# Patient Record
Sex: Male | Born: 1970
Health system: Southern US, Community
[De-identification: ages and names within clinical notes are randomized; demographics above are authoritative.]

## PROBLEM LIST (undated history)

## (undated) DIAGNOSIS — F32A Depression, unspecified: Secondary | ICD-10-CM

## (undated) DIAGNOSIS — R569 Unspecified convulsions: Secondary | ICD-10-CM

## (undated) DIAGNOSIS — F259 Schizoaffective disorder, unspecified: Secondary | ICD-10-CM

## (undated) DIAGNOSIS — I712 Thoracic aortic aneurysm, without rupture: Secondary | ICD-10-CM

## (undated) DIAGNOSIS — R300 Dysuria: Secondary | ICD-10-CM

## (undated) DIAGNOSIS — I10 Essential (primary) hypertension: Secondary | ICD-10-CM

## (undated) DIAGNOSIS — R0602 Shortness of breath: Secondary | ICD-10-CM

## (undated) DIAGNOSIS — M549 Dorsalgia, unspecified: Secondary | ICD-10-CM

## (undated) DIAGNOSIS — F250 Schizoaffective disorder, bipolar type: Secondary | ICD-10-CM

## (undated) DIAGNOSIS — R51 Headache: Secondary | ICD-10-CM

## (undated) DIAGNOSIS — G8929 Other chronic pain: Secondary | ICD-10-CM

## (undated) DIAGNOSIS — Z8719 Personal history of other diseases of the digestive system: Secondary | ICD-10-CM

## (undated) DIAGNOSIS — F329 Major depressive disorder, single episode, unspecified: Secondary | ICD-10-CM

## (undated) DIAGNOSIS — F99 Mental disorder, not otherwise specified: Secondary | ICD-10-CM

## (undated) DIAGNOSIS — Z5900 Homelessness unspecified: Secondary | ICD-10-CM

## (undated) DIAGNOSIS — F319 Bipolar disorder, unspecified: Secondary | ICD-10-CM

## (undated) DIAGNOSIS — J189 Pneumonia, unspecified organism: Secondary | ICD-10-CM

## (undated) DIAGNOSIS — I639 Cerebral infarction, unspecified: Secondary | ICD-10-CM

## (undated) DIAGNOSIS — J42 Unspecified chronic bronchitis: Secondary | ICD-10-CM

## (undated) DIAGNOSIS — F419 Anxiety disorder, unspecified: Secondary | ICD-10-CM

## (undated) DIAGNOSIS — K219 Gastro-esophageal reflux disease without esophagitis: Secondary | ICD-10-CM

## (undated) DIAGNOSIS — I219 Acute myocardial infarction, unspecified: Secondary | ICD-10-CM

## (undated) DIAGNOSIS — M1990 Unspecified osteoarthritis, unspecified site: Secondary | ICD-10-CM

## (undated) DIAGNOSIS — K759 Inflammatory liver disease, unspecified: Secondary | ICD-10-CM

---

## 2004-08-26 ENCOUNTER — Emergency Department (HOSPITAL_COMMUNITY): Admission: EM | Admit: 2004-08-26 | Discharge: 2004-08-26 | Payer: Self-pay | Admitting: Emergency Medicine

## 2004-09-07 ENCOUNTER — Emergency Department (HOSPITAL_COMMUNITY): Admission: EM | Admit: 2004-09-07 | Discharge: 2004-09-07 | Payer: Self-pay | Admitting: Emergency Medicine

## 2004-09-14 ENCOUNTER — Emergency Department (HOSPITAL_COMMUNITY): Admission: EM | Admit: 2004-09-14 | Discharge: 2004-09-14 | Payer: Self-pay | Admitting: Emergency Medicine

## 2004-10-05 ENCOUNTER — Emergency Department (HOSPITAL_COMMUNITY): Admission: EM | Admit: 2004-10-05 | Discharge: 2004-10-05 | Payer: Self-pay | Admitting: *Deleted

## 2004-11-22 ENCOUNTER — Emergency Department (HOSPITAL_COMMUNITY): Admission: EM | Admit: 2004-11-22 | Discharge: 2004-11-22 | Payer: Self-pay | Admitting: Emergency Medicine

## 2004-11-24 ENCOUNTER — Emergency Department (HOSPITAL_COMMUNITY): Admission: EM | Admit: 2004-11-24 | Discharge: 2004-11-24 | Payer: Self-pay | Admitting: Emergency Medicine

## 2004-12-01 ENCOUNTER — Emergency Department (HOSPITAL_COMMUNITY): Admission: EM | Admit: 2004-12-01 | Discharge: 2004-12-01 | Payer: Self-pay | Admitting: Emergency Medicine

## 2004-12-02 ENCOUNTER — Emergency Department (HOSPITAL_COMMUNITY): Admission: EM | Admit: 2004-12-02 | Discharge: 2004-12-02 | Payer: Self-pay | Admitting: Emergency Medicine

## 2004-12-05 ENCOUNTER — Emergency Department (HOSPITAL_COMMUNITY): Admission: EM | Admit: 2004-12-05 | Discharge: 2004-12-05 | Payer: Self-pay | Admitting: Emergency Medicine

## 2004-12-27 ENCOUNTER — Emergency Department (HOSPITAL_COMMUNITY): Admission: EM | Admit: 2004-12-27 | Discharge: 2004-12-27 | Payer: Self-pay | Admitting: Emergency Medicine

## 2005-01-03 ENCOUNTER — Emergency Department (HOSPITAL_COMMUNITY): Admission: EM | Admit: 2005-01-03 | Discharge: 2005-01-03 | Payer: Self-pay | Admitting: Emergency Medicine

## 2005-01-05 ENCOUNTER — Emergency Department (HOSPITAL_COMMUNITY): Admission: EM | Admit: 2005-01-05 | Discharge: 2005-01-05 | Payer: Self-pay | Admitting: Emergency Medicine

## 2005-10-17 ENCOUNTER — Emergency Department (HOSPITAL_COMMUNITY): Admission: EM | Admit: 2005-10-17 | Discharge: 2005-10-17 | Payer: Self-pay | Admitting: *Deleted

## 2005-11-13 ENCOUNTER — Emergency Department (HOSPITAL_COMMUNITY): Admission: EM | Admit: 2005-11-13 | Discharge: 2005-11-13 | Payer: Self-pay | Admitting: Emergency Medicine

## 2005-11-14 ENCOUNTER — Emergency Department (HOSPITAL_COMMUNITY): Admission: EM | Admit: 2005-11-14 | Discharge: 2005-11-14 | Payer: Self-pay | Admitting: Emergency Medicine

## 2005-11-15 ENCOUNTER — Emergency Department (HOSPITAL_COMMUNITY): Admission: EM | Admit: 2005-11-15 | Discharge: 2005-11-15 | Payer: Self-pay | Admitting: *Deleted

## 2005-11-21 ENCOUNTER — Emergency Department (HOSPITAL_COMMUNITY): Admission: EM | Admit: 2005-11-21 | Discharge: 2005-11-21 | Payer: Self-pay | Admitting: Emergency Medicine

## 2006-01-16 ENCOUNTER — Encounter: Payer: Self-pay | Admitting: Vascular Surgery

## 2006-01-16 ENCOUNTER — Ambulatory Visit (HOSPITAL_COMMUNITY): Admission: RE | Admit: 2006-01-16 | Discharge: 2006-01-16 | Payer: Self-pay | Admitting: Internal Medicine

## 2006-01-18 ENCOUNTER — Emergency Department (HOSPITAL_COMMUNITY): Admission: EM | Admit: 2006-01-18 | Discharge: 2006-01-18 | Payer: Self-pay | Admitting: Emergency Medicine

## 2006-03-20 HISTORY — PX: CHOLECYSTECTOMY: SHX55

## 2006-05-06 ENCOUNTER — Emergency Department (HOSPITAL_COMMUNITY): Admission: EM | Admit: 2006-05-06 | Discharge: 2006-05-06 | Payer: Self-pay | Admitting: Emergency Medicine

## 2007-01-18 ENCOUNTER — Observation Stay (HOSPITAL_COMMUNITY): Admission: EM | Admit: 2007-01-18 | Discharge: 2007-01-19 | Payer: Self-pay | Admitting: Emergency Medicine

## 2007-01-18 ENCOUNTER — Encounter (INDEPENDENT_AMBULATORY_CARE_PROVIDER_SITE_OTHER): Payer: Self-pay | Admitting: Surgery

## 2007-01-26 ENCOUNTER — Emergency Department (HOSPITAL_COMMUNITY): Admission: EM | Admit: 2007-01-26 | Discharge: 2007-01-26 | Payer: Self-pay | Admitting: Emergency Medicine

## 2007-02-05 ENCOUNTER — Emergency Department (HOSPITAL_COMMUNITY): Admission: EM | Admit: 2007-02-05 | Discharge: 2007-02-05 | Payer: Self-pay | Admitting: Emergency Medicine

## 2007-02-07 ENCOUNTER — Emergency Department (HOSPITAL_COMMUNITY): Admission: EM | Admit: 2007-02-07 | Discharge: 2007-02-07 | Payer: Self-pay | Admitting: Emergency Medicine

## 2007-05-22 ENCOUNTER — Emergency Department (HOSPITAL_COMMUNITY): Admission: EM | Admit: 2007-05-22 | Discharge: 2007-05-22 | Payer: Self-pay | Admitting: Emergency Medicine

## 2007-05-24 ENCOUNTER — Emergency Department (HOSPITAL_COMMUNITY): Admission: EM | Admit: 2007-05-24 | Discharge: 2007-05-24 | Payer: Self-pay | Admitting: Emergency Medicine

## 2007-05-28 ENCOUNTER — Emergency Department (HOSPITAL_COMMUNITY): Admission: EM | Admit: 2007-05-28 | Discharge: 2007-05-28 | Payer: Self-pay | Admitting: Emergency Medicine

## 2007-06-04 ENCOUNTER — Encounter: Admission: RE | Admit: 2007-06-04 | Discharge: 2007-06-04 | Payer: Self-pay | Admitting: Gastroenterology

## 2007-06-19 ENCOUNTER — Emergency Department (HOSPITAL_COMMUNITY): Admission: EM | Admit: 2007-06-19 | Discharge: 2007-06-19 | Payer: Self-pay | Admitting: Emergency Medicine

## 2007-06-25 ENCOUNTER — Emergency Department (HOSPITAL_COMMUNITY): Admission: EM | Admit: 2007-06-25 | Discharge: 2007-06-25 | Payer: Self-pay | Admitting: Emergency Medicine

## 2007-06-27 ENCOUNTER — Emergency Department (HOSPITAL_COMMUNITY): Admission: EM | Admit: 2007-06-27 | Discharge: 2007-06-27 | Payer: Self-pay | Admitting: Emergency Medicine

## 2007-06-29 ENCOUNTER — Emergency Department (HOSPITAL_COMMUNITY): Admission: EM | Admit: 2007-06-29 | Discharge: 2007-06-29 | Payer: Self-pay | Admitting: Emergency Medicine

## 2007-06-30 ENCOUNTER — Emergency Department (HOSPITAL_COMMUNITY): Admission: EM | Admit: 2007-06-30 | Discharge: 2007-06-30 | Payer: Self-pay | Admitting: Emergency Medicine

## 2007-07-04 ENCOUNTER — Emergency Department (HOSPITAL_COMMUNITY): Admission: EM | Admit: 2007-07-04 | Discharge: 2007-07-04 | Payer: Self-pay | Admitting: Emergency Medicine

## 2007-07-10 ENCOUNTER — Emergency Department (HOSPITAL_COMMUNITY): Admission: EM | Admit: 2007-07-10 | Discharge: 2007-07-10 | Payer: Self-pay | Admitting: Emergency Medicine

## 2007-07-15 ENCOUNTER — Emergency Department (HOSPITAL_COMMUNITY): Admission: EM | Admit: 2007-07-15 | Discharge: 2007-07-15 | Payer: Self-pay | Admitting: Emergency Medicine

## 2007-07-25 ENCOUNTER — Emergency Department (HOSPITAL_COMMUNITY): Admission: EM | Admit: 2007-07-25 | Discharge: 2007-07-25 | Payer: Self-pay | Admitting: Emergency Medicine

## 2007-08-14 ENCOUNTER — Emergency Department (HOSPITAL_COMMUNITY): Admission: EM | Admit: 2007-08-14 | Discharge: 2007-08-14 | Payer: Self-pay | Admitting: Emergency Medicine

## 2007-08-31 ENCOUNTER — Emergency Department (HOSPITAL_COMMUNITY): Admission: EM | Admit: 2007-08-31 | Discharge: 2007-08-31 | Payer: Self-pay | Admitting: Emergency Medicine

## 2007-11-26 ENCOUNTER — Encounter: Admission: RE | Admit: 2007-11-26 | Discharge: 2007-11-26 | Payer: Self-pay | Admitting: Surgery

## 2007-12-10 ENCOUNTER — Emergency Department (HOSPITAL_COMMUNITY): Admission: EM | Admit: 2007-12-10 | Discharge: 2007-12-10 | Payer: Self-pay | Admitting: Family Medicine

## 2007-12-23 ENCOUNTER — Emergency Department (HOSPITAL_COMMUNITY): Admission: EM | Admit: 2007-12-23 | Discharge: 2007-12-23 | Payer: Self-pay | Admitting: Emergency Medicine

## 2007-12-24 ENCOUNTER — Ambulatory Visit: Payer: Self-pay | Admitting: Internal Medicine

## 2007-12-24 LAB — CONVERTED CEMR LAB
ALT: 38 units/L (ref 0–53)
Albumin: 4.7 g/dL (ref 3.5–5.2)
Basophils Relative: 0 % (ref 0–1)
CO2: 26 meq/L (ref 19–32)
Calcium: 9.5 mg/dL (ref 8.4–10.5)
Chloride: 102 meq/L (ref 96–112)
Eosinophils Absolute: 0.2 10*3/uL (ref 0.0–0.7)
Glucose, Bld: 107 mg/dL — ABNORMAL HIGH (ref 70–99)
Hemoglobin: 17.4 g/dL — ABNORMAL HIGH (ref 13.0–17.0)
Lymphs Abs: 3.1 10*3/uL (ref 0.7–4.0)
MCV: 83.2 fL (ref 78.0–100.0)
Potassium: 4.2 meq/L (ref 3.5–5.3)
Sodium: 142 meq/L (ref 135–145)
Total Protein: 6.9 g/dL (ref 6.0–8.3)
Uric Acid, Serum: 9.7 mg/dL — ABNORMAL HIGH (ref 4.0–7.8)

## 2007-12-31 ENCOUNTER — Emergency Department (HOSPITAL_COMMUNITY): Admission: EM | Admit: 2007-12-31 | Discharge: 2007-12-31 | Payer: Self-pay | Admitting: Emergency Medicine

## 2008-01-03 ENCOUNTER — Emergency Department (HOSPITAL_COMMUNITY): Admission: EM | Admit: 2008-01-03 | Discharge: 2008-01-03 | Payer: Self-pay | Admitting: Emergency Medicine

## 2008-01-14 ENCOUNTER — Emergency Department (HOSPITAL_COMMUNITY): Admission: EM | Admit: 2008-01-14 | Discharge: 2008-01-14 | Payer: Self-pay | Admitting: Emergency Medicine

## 2008-01-22 ENCOUNTER — Ambulatory Visit: Payer: Self-pay | Admitting: Internal Medicine

## 2008-01-23 ENCOUNTER — Ambulatory Visit: Payer: Self-pay | Admitting: Internal Medicine

## 2008-01-24 ENCOUNTER — Ambulatory Visit: Payer: Self-pay | Admitting: *Deleted

## 2008-01-25 ENCOUNTER — Ambulatory Visit (HOSPITAL_COMMUNITY): Admission: RE | Admit: 2008-01-25 | Discharge: 2008-01-25 | Payer: Self-pay | Admitting: Internal Medicine

## 2008-01-31 ENCOUNTER — Emergency Department (HOSPITAL_COMMUNITY): Admission: EM | Admit: 2008-01-31 | Discharge: 2008-01-31 | Payer: Self-pay | Admitting: Emergency Medicine

## 2008-02-12 ENCOUNTER — Ambulatory Visit: Payer: Self-pay | Admitting: Internal Medicine

## 2008-03-18 ENCOUNTER — Ambulatory Visit: Payer: Self-pay | Admitting: Internal Medicine

## 2008-04-17 ENCOUNTER — Ambulatory Visit: Payer: Self-pay | Admitting: Family Medicine

## 2008-04-23 ENCOUNTER — Ambulatory Visit: Payer: Self-pay | Admitting: Internal Medicine

## 2008-04-23 LAB — CONVERTED CEMR LAB
ALT: 24 units/L (ref 0–53)
BUN: 9 mg/dL (ref 6–23)
CO2: 22 meq/L (ref 19–32)
Chloride: 107 meq/L (ref 96–112)
HCV Ab: NEGATIVE
Hep A Total Ab: POSITIVE — AB
Hepatitis B Surface Ag: NEGATIVE
Rhuematoid fact SerPl-aCnc: <20 intl units/mL (ref 0–20)
Total Bilirubin: 1.1 mg/dL (ref 0.3–1.2)
Total Protein: 7.3 g/dL (ref 6.0–8.3)

## 2008-04-24 ENCOUNTER — Encounter (INDEPENDENT_AMBULATORY_CARE_PROVIDER_SITE_OTHER): Payer: Self-pay | Admitting: Internal Medicine

## 2008-04-26 ENCOUNTER — Emergency Department (HOSPITAL_COMMUNITY): Admission: EM | Admit: 2008-04-26 | Discharge: 2008-04-27 | Payer: Self-pay | Admitting: Emergency Medicine

## 2008-05-13 ENCOUNTER — Ambulatory Visit (HOSPITAL_COMMUNITY): Admission: RE | Admit: 2008-05-13 | Discharge: 2008-05-13 | Payer: Self-pay | Admitting: Internal Medicine

## 2008-05-21 ENCOUNTER — Ambulatory Visit: Payer: Self-pay | Admitting: Internal Medicine

## 2008-05-31 ENCOUNTER — Emergency Department (HOSPITAL_COMMUNITY): Admission: EM | Admit: 2008-05-31 | Discharge: 2008-05-31 | Payer: Self-pay | Admitting: Emergency Medicine

## 2008-06-12 ENCOUNTER — Encounter: Admission: RE | Admit: 2008-06-12 | Discharge: 2008-06-12 | Payer: Self-pay | Admitting: Internal Medicine

## 2008-06-13 ENCOUNTER — Emergency Department (HOSPITAL_COMMUNITY): Admission: EM | Admit: 2008-06-13 | Discharge: 2008-06-13 | Payer: Self-pay | Admitting: Emergency Medicine

## 2008-06-24 ENCOUNTER — Ambulatory Visit: Payer: Self-pay | Admitting: Internal Medicine

## 2008-06-30 ENCOUNTER — Ambulatory Visit: Payer: Self-pay | Admitting: Internal Medicine

## 2008-07-01 ENCOUNTER — Encounter (INDEPENDENT_AMBULATORY_CARE_PROVIDER_SITE_OTHER): Payer: Self-pay | Admitting: Internal Medicine

## 2008-07-07 ENCOUNTER — Emergency Department (HOSPITAL_COMMUNITY): Admission: EM | Admit: 2008-07-07 | Discharge: 2008-07-08 | Payer: Self-pay | Admitting: Emergency Medicine

## 2008-07-14 ENCOUNTER — Emergency Department (HOSPITAL_COMMUNITY): Admission: EM | Admit: 2008-07-14 | Discharge: 2008-07-14 | Payer: Self-pay | Admitting: Emergency Medicine

## 2008-07-24 ENCOUNTER — Emergency Department (HOSPITAL_COMMUNITY): Admission: EM | Admit: 2008-07-24 | Discharge: 2008-07-24 | Payer: Self-pay | Admitting: Emergency Medicine

## 2008-08-06 ENCOUNTER — Ambulatory Visit: Payer: Self-pay | Admitting: Internal Medicine

## 2008-08-26 ENCOUNTER — Ambulatory Visit: Payer: Self-pay | Admitting: Internal Medicine

## 2008-08-31 ENCOUNTER — Ambulatory Visit: Payer: Self-pay | Admitting: Internal Medicine

## 2008-11-13 ENCOUNTER — Emergency Department (HOSPITAL_COMMUNITY): Admission: EM | Admit: 2008-11-13 | Discharge: 2008-11-13 | Payer: Self-pay | Admitting: Emergency Medicine

## 2008-11-13 ENCOUNTER — Telehealth (INDEPENDENT_AMBULATORY_CARE_PROVIDER_SITE_OTHER): Payer: Self-pay | Admitting: *Deleted

## 2008-11-17 ENCOUNTER — Emergency Department (HOSPITAL_COMMUNITY): Admission: EM | Admit: 2008-11-17 | Discharge: 2008-11-17 | Payer: Self-pay | Admitting: Emergency Medicine

## 2008-11-19 ENCOUNTER — Emergency Department (HOSPITAL_COMMUNITY): Admission: EM | Admit: 2008-11-19 | Discharge: 2008-11-19 | Payer: Self-pay | Admitting: Emergency Medicine

## 2008-11-30 ENCOUNTER — Telehealth (INDEPENDENT_AMBULATORY_CARE_PROVIDER_SITE_OTHER): Payer: Self-pay | Admitting: *Deleted

## 2008-12-09 ENCOUNTER — Ambulatory Visit: Payer: Self-pay | Admitting: Internal Medicine

## 2009-02-12 ENCOUNTER — Inpatient Hospital Stay (HOSPITAL_COMMUNITY): Admission: EM | Admit: 2009-02-12 | Discharge: 2009-02-15 | Payer: Self-pay | Admitting: Emergency Medicine

## 2009-04-13 ENCOUNTER — Ambulatory Visit: Payer: Self-pay | Admitting: Family Medicine

## 2009-05-07 ENCOUNTER — Emergency Department (HOSPITAL_COMMUNITY): Admission: EM | Admit: 2009-05-07 | Discharge: 2009-05-08 | Payer: Self-pay | Admitting: Emergency Medicine

## 2009-07-06 ENCOUNTER — Ambulatory Visit: Payer: Self-pay | Admitting: Internal Medicine

## 2009-08-04 ENCOUNTER — Ambulatory Visit: Payer: Self-pay | Admitting: Internal Medicine

## 2009-09-01 ENCOUNTER — Ambulatory Visit: Payer: Self-pay | Admitting: Internal Medicine

## 2009-11-06 ENCOUNTER — Observation Stay (HOSPITAL_COMMUNITY): Admission: EM | Admit: 2009-11-06 | Discharge: 2009-11-09 | Payer: Self-pay | Admitting: Emergency Medicine

## 2010-01-13 ENCOUNTER — Emergency Department (HOSPITAL_COMMUNITY): Admission: EM | Admit: 2010-01-13 | Discharge: 2010-01-14 | Payer: Self-pay | Admitting: Emergency Medicine

## 2010-01-28 ENCOUNTER — Ambulatory Visit: Payer: Self-pay | Admitting: Psychiatry

## 2010-02-24 ENCOUNTER — Emergency Department (HOSPITAL_COMMUNITY)
Admission: EM | Admit: 2010-02-24 | Discharge: 2010-02-24 | Payer: Self-pay | Source: Home / Self Care | Admitting: Emergency Medicine

## 2010-03-20 DIAGNOSIS — I712 Thoracic aortic aneurysm, without rupture, unspecified: Secondary | ICD-10-CM

## 2010-03-20 HISTORY — DX: Thoracic aortic aneurysm, without rupture: I71.2

## 2010-03-20 HISTORY — DX: Thoracic aortic aneurysm, without rupture, unspecified: I71.20

## 2010-04-20 ENCOUNTER — Encounter (INDEPENDENT_AMBULATORY_CARE_PROVIDER_SITE_OTHER): Payer: Self-pay | Admitting: *Deleted

## 2010-04-20 LAB — CONVERTED CEMR LAB
BUN: 12 mg/dL (ref 6–23)
Basophils Relative: 0 % (ref 0–1)
CO2: 23 meq/L (ref 19–32)
Calcium: 9.6 mg/dL (ref 8.4–10.5)
Chloride: 97 meq/L (ref 96–112)
Creatinine, Ser: 0.89 mg/dL (ref 0.40–1.50)
HCT: 51.4 % (ref 39.0–52.0)
Hemoglobin: 18 g/dL — ABNORMAL HIGH (ref 13.0–17.0)
MCHC: 35 g/dL (ref 30.0–36.0)
Monocytes Absolute: 0.9 10*3/uL (ref 0.1–1.0)
Monocytes Relative: 8 % (ref 3–12)
Neutro Abs: 6 10*3/uL (ref 1.7–7.7)
RBC: 6.19 M/uL — ABNORMAL HIGH (ref 4.22–5.81)
Total CK: 158 units/L (ref 7–232)

## 2010-04-21 ENCOUNTER — Encounter (INDEPENDENT_AMBULATORY_CARE_PROVIDER_SITE_OTHER): Payer: Self-pay | Admitting: *Deleted

## 2010-04-21 LAB — CONVERTED CEMR LAB
Hep B Core Total Ab: POSITIVE — AB
Hep B S Ab: POSITIVE — AB

## 2010-04-28 ENCOUNTER — Emergency Department (HOSPITAL_COMMUNITY): Payer: Medicaid Other

## 2010-04-28 ENCOUNTER — Inpatient Hospital Stay (HOSPITAL_COMMUNITY): Payer: Medicaid Other

## 2010-04-28 ENCOUNTER — Inpatient Hospital Stay (HOSPITAL_COMMUNITY)
Admission: EM | Admit: 2010-04-28 | Discharge: 2010-05-04 | DRG: 917 | Disposition: A | Discharge status: Other Acute Inpatient Hospital | Payer: Medicaid Other | Attending: Internal Medicine | Admitting: Internal Medicine

## 2010-04-28 DIAGNOSIS — J690 Pneumonitis due to inhalation of food and vomit: Secondary | ICD-10-CM | POA: Diagnosis present

## 2010-04-28 DIAGNOSIS — N179 Acute kidney failure, unspecified: Secondary | ICD-10-CM | POA: Diagnosis present

## 2010-04-28 DIAGNOSIS — F329 Major depressive disorder, single episode, unspecified: Secondary | ICD-10-CM | POA: Diagnosis present

## 2010-04-28 DIAGNOSIS — T424X4A Poisoning by benzodiazepines, undetermined, initial encounter: Principal | ICD-10-CM | POA: Diagnosis present

## 2010-04-28 DIAGNOSIS — G8929 Other chronic pain: Secondary | ICD-10-CM | POA: Diagnosis present

## 2010-04-28 DIAGNOSIS — R4182 Altered mental status, unspecified: Secondary | ICD-10-CM

## 2010-04-28 DIAGNOSIS — F3289 Other specified depressive episodes: Secondary | ICD-10-CM | POA: Diagnosis present

## 2010-04-28 DIAGNOSIS — E119 Type 2 diabetes mellitus without complications: Secondary | ICD-10-CM | POA: Diagnosis present

## 2010-04-28 DIAGNOSIS — T43591A Poisoning by other antipsychotics and neuroleptics, accidental (unintentional), initial encounter: Secondary | ICD-10-CM | POA: Diagnosis present

## 2010-04-28 DIAGNOSIS — J9600 Acute respiratory failure, unspecified whether with hypoxia or hypercapnia: Secondary | ICD-10-CM

## 2010-04-28 DIAGNOSIS — I959 Hypotension, unspecified: Secondary | ICD-10-CM

## 2010-04-28 DIAGNOSIS — Z794 Long term (current) use of insulin: Secondary | ICD-10-CM

## 2010-04-28 LAB — POCT I-STAT, CHEM 8
Chloride: 96 mEq/L (ref 96–112)
Creatinine, Ser: 11.2 mg/dL — ABNORMAL HIGH (ref 0.4–1.5)
Glucose, Bld: 128 mg/dL — ABNORMAL HIGH (ref 70–99)
Potassium: 4 mEq/L (ref 3.5–5.1)

## 2010-04-28 LAB — CK TOTAL AND CKMB (NOT AT ARMC): Total CK: 7304 U/L — ABNORMAL HIGH (ref 7–232)

## 2010-04-28 LAB — POCT I-STAT 3, ART BLOOD GAS (G3+)
Acid-base deficit: 8 mmol/L — ABNORMAL HIGH (ref 0.0–2.0)
Acid-base deficit: 8 mmol/L — ABNORMAL HIGH (ref 0.0–2.0)
O2 Saturation: 95 %
O2 Saturation: 95 %
Patient temperature: 37.1
TCO2: 19 mmol/L (ref 0–100)
pCO2 arterial: 33.6 mmHg — ABNORMAL LOW (ref 35.0–45.0)
pCO2 arterial: 35.3 mmHg (ref 35.0–45.0)
pH, Arterial: 7.306 — ABNORMAL LOW (ref 7.350–7.450)
pO2, Arterial: 79 mmHg — ABNORMAL LOW (ref 80.0–100.0)

## 2010-04-28 LAB — URINALYSIS, ROUTINE W REFLEX MICROSCOPIC
Protein, ur: NEGATIVE mg/dL
Urobilinogen, UA: 0.2 mg/dL (ref 0.0–1.0)

## 2010-04-28 LAB — HEPATIC FUNCTION PANEL
AST: 74 U/L — ABNORMAL HIGH (ref 0–37)
Alkaline Phosphatase: 71 U/L (ref 39–117)
Bilirubin, Direct: 0.3 mg/dL (ref 0.0–0.3)
Total Bilirubin: 1.6 mg/dL — ABNORMAL HIGH (ref 0.3–1.2)

## 2010-04-28 LAB — CBC
HCT: 47.9 % (ref 39.0–52.0)
MCHC: 36.3 g/dL — ABNORMAL HIGH (ref 30.0–36.0)
MCV: 80.6 fL (ref 78.0–100.0)
RDW: 13 % (ref 11.5–15.5)
WBC: 17.9 10*3/uL — ABNORMAL HIGH (ref 4.0–10.5)

## 2010-04-28 LAB — DIFFERENTIAL
Basophils Relative: 0 % (ref 0–1)
Eosinophils Relative: 0 % (ref 0–5)
Lymphs Abs: 2.1 10*3/uL (ref 0.7–4.0)
Monocytes Absolute: 2.3 10*3/uL — ABNORMAL HIGH (ref 0.1–1.0)

## 2010-04-28 LAB — BASIC METABOLIC PANEL
CO2: 19 mEq/L (ref 19–32)
Calcium: 6.6 mg/dL — ABNORMAL LOW (ref 8.4–10.5)
Chloride: 98 mEq/L (ref 96–112)
Creatinine, Ser: 9.14 mg/dL — ABNORMAL HIGH (ref 0.4–1.5)
Glucose, Bld: 141 mg/dL — ABNORMAL HIGH (ref 70–99)

## 2010-04-28 LAB — RAPID URINE DRUG SCREEN, HOSP PERFORMED
Amphetamines: NOT DETECTED
Barbiturates: NOT DETECTED
Benzodiazepines: POSITIVE — AB
Cocaine: NOT DETECTED
Opiates: NOT DETECTED

## 2010-04-28 LAB — CARDIAC PANEL(CRET KIN+CKTOT+MB+TROPI)
Total CK: 8624 U/L — ABNORMAL HIGH (ref 7–232)
Troponin I: 0.35 ng/mL — ABNORMAL HIGH (ref 0.00–0.06)

## 2010-04-28 LAB — GLUCOSE, CAPILLARY
Glucose-Capillary: 115 mg/dL — ABNORMAL HIGH (ref 70–99)
Glucose-Capillary: 116 mg/dL — ABNORMAL HIGH (ref 70–99)

## 2010-04-28 LAB — MAGNESIUM: Magnesium: 1.9 mg/dL (ref 1.5–2.5)

## 2010-04-28 LAB — BRAIN NATRIURETIC PEPTIDE: Pro B Natriuretic peptide (BNP): <30 pg/mL (ref 0.0–100.0)

## 2010-04-28 LAB — APTT: aPTT: 28 seconds (ref 24–37)

## 2010-04-28 LAB — SAMPLE TO BLOOD BANK

## 2010-04-28 LAB — PROCALCITONIN: Procalcitonin: 10.08 ng/mL

## 2010-04-29 ENCOUNTER — Inpatient Hospital Stay (HOSPITAL_COMMUNITY): Payer: Medicaid Other

## 2010-04-29 LAB — LEGIONELLA ANTIGEN, URINE: Legionella Antigen, Urine: NEGATIVE

## 2010-04-29 LAB — CBC
HCT: 37.5 % — ABNORMAL LOW (ref 39.0–52.0)
Hemoglobin: 13.3 g/dL (ref 13.0–17.0)
MCV: 82.6 fL (ref 78.0–100.0)
RBC: 4.54 MIL/uL (ref 4.22–5.81)
WBC: 8.1 10*3/uL (ref 4.0–10.5)

## 2010-04-29 LAB — URINE CULTURE
Colony Count: NO GROWTH
Culture  Setup Time: 201202100133
Culture: NO GROWTH

## 2010-04-29 LAB — BASIC METABOLIC PANEL
BUN: 48 mg/dL — ABNORMAL HIGH (ref 6–23)
Chloride: 112 mEq/L (ref 96–112)
Glucose, Bld: 116 mg/dL — ABNORMAL HIGH (ref 70–99)
Potassium: 3.4 mEq/L — ABNORMAL LOW (ref 3.5–5.1)

## 2010-04-29 LAB — CARDIAC PANEL(CRET KIN+CKTOT+MB+TROPI)
CK, MB: 56.1 ng/mL (ref 0.3–4.0)
CK, MB: 42.1 ng/mL (ref 0.3–4.0)
Relative Index: 0.7 (ref 0.0–2.5)

## 2010-04-29 LAB — GLUCOSE, CAPILLARY: Glucose-Capillary: 110 mg/dL — ABNORMAL HIGH (ref 70–99)

## 2010-04-30 ENCOUNTER — Inpatient Hospital Stay (HOSPITAL_COMMUNITY): Payer: Medicaid Other

## 2010-04-30 LAB — COMPREHENSIVE METABOLIC PANEL
Albumin: 2.6 g/dL — ABNORMAL LOW (ref 3.5–5.2)
Alkaline Phosphatase: 62 U/L (ref 39–117)
BUN: 21 mg/dL (ref 6–23)
Chloride: 107 mEq/L (ref 96–112)
Creatinine, Ser: 0.97 mg/dL (ref 0.4–1.5)
GFR calc non Af Amer: >60 mL/min (ref >60)
Glucose, Bld: 91 mg/dL (ref 70–99)
Potassium: 3.1 mEq/L — ABNORMAL LOW (ref 3.5–5.1)
Total Bilirubin: 1.7 mg/dL — ABNORMAL HIGH (ref 0.3–1.2)

## 2010-04-30 LAB — CBC
HCT: 34.7 % — ABNORMAL LOW (ref 39.0–52.0)
MCH: 29.4 pg (ref 26.0–34.0)
MCV: 81.6 fL (ref 78.0–100.0)
Platelets: 118 10*3/uL — ABNORMAL LOW (ref 150–400)
RBC: 4.25 MIL/uL (ref 4.22–5.81)

## 2010-04-30 LAB — GLUCOSE, CAPILLARY
Glucose-Capillary: 94 mg/dL (ref 70–99)
Glucose-Capillary: 101 mg/dL — ABNORMAL HIGH (ref 70–99)
Glucose-Capillary: 90 mg/dL (ref 70–99)
Glucose-Capillary: 110 mg/dL — ABNORMAL HIGH (ref 70–99)

## 2010-04-30 LAB — MAGNESIUM: Magnesium: 1.5 mg/dL (ref 1.5–2.5)

## 2010-04-30 LAB — MYOGLOBIN, URINE: Myoglobin, Ur: 106 mcg/L — ABNORMAL HIGH (ref <28)

## 2010-04-30 LAB — PHOSPHORUS: Phosphorus: 1.3 mg/dL — ABNORMAL LOW (ref 2.3–4.6)

## 2010-05-01 LAB — CBC
HCT: 36.3 % — ABNORMAL LOW (ref 39.0–52.0)
MCH: 28.8 pg (ref 26.0–34.0)
MCV: 81.6 fL (ref 78.0–100.0)
Platelets: 144 10*3/uL — ABNORMAL LOW (ref 150–400)
RDW: 12.3 % (ref 11.5–15.5)

## 2010-05-01 LAB — BASIC METABOLIC PANEL
BUN: 10 mg/dL (ref 6–23)
Creatinine, Ser: 0.81 mg/dL (ref 0.4–1.5)
GFR calc non Af Amer: >60 mL/min (ref >60)
Glucose, Bld: 95 mg/dL (ref 70–99)
Potassium: 3.6 mEq/L (ref 3.5–5.1)

## 2010-05-01 LAB — CULTURE, BLOOD (ROUTINE X 2)

## 2010-05-01 LAB — GLUCOSE, CAPILLARY
Glucose-Capillary: 129 mg/dL — ABNORMAL HIGH (ref 70–99)
Glucose-Capillary: 99 mg/dL (ref 70–99)
Glucose-Capillary: 104 mg/dL — ABNORMAL HIGH (ref 70–99)
Glucose-Capillary: 105 mg/dL — ABNORMAL HIGH (ref 70–99)

## 2010-05-01 LAB — MAGNESIUM: Magnesium: 1.5 mg/dL (ref 1.5–2.5)

## 2010-05-01 LAB — CULTURE, RESPIRATORY W GRAM STAIN

## 2010-05-02 LAB — COMPREHENSIVE METABOLIC PANEL
AST: 50 U/L — ABNORMAL HIGH (ref 0–37)
Albumin: 2.7 g/dL — ABNORMAL LOW (ref 3.5–5.2)
BUN: 7 mg/dL (ref 6–23)
Creatinine, Ser: 0.74 mg/dL (ref 0.4–1.5)
GFR calc Af Amer: >60 mL/min (ref >60)
Total Protein: 5.5 g/dL — ABNORMAL LOW (ref 6.0–8.3)

## 2010-05-02 LAB — GLUCOSE, CAPILLARY
Glucose-Capillary: 109 mg/dL — ABNORMAL HIGH (ref 70–99)
Glucose-Capillary: 110 mg/dL — ABNORMAL HIGH (ref 70–99)

## 2010-05-02 LAB — CBC
MCH: 28.8 pg (ref 26.0–34.0)
MCV: 81.1 fL (ref 78.0–100.0)
Platelets: 155 10*3/uL (ref 150–400)
RBC: 4.38 MIL/uL (ref 4.22–5.81)
RDW: 12.4 % (ref 11.5–15.5)
WBC: 9 10*3/uL (ref 4.0–10.5)

## 2010-05-03 DIAGNOSIS — F331 Major depressive disorder, recurrent, moderate: Secondary | ICD-10-CM

## 2010-05-03 LAB — GLUCOSE, CAPILLARY: Glucose-Capillary: 104 mg/dL — ABNORMAL HIGH (ref 70–99)

## 2010-05-04 ENCOUNTER — Inpatient Hospital Stay (HOSPITAL_COMMUNITY)
Admission: AD | Admit: 2010-05-04 | Discharge: 2010-05-12 | DRG: 885 | Disposition: A | Discharge status: Home / Self Care | Payer: Medicaid Other | Source: Ambulatory Visit | Attending: Psychiatry | Admitting: Psychiatry

## 2010-05-04 DIAGNOSIS — T43502A Poisoning by unspecified antipsychotics and neuroleptics, intentional self-harm, initial encounter: Secondary | ICD-10-CM

## 2010-05-04 DIAGNOSIS — J690 Pneumonitis due to inhalation of food and vomit: Secondary | ICD-10-CM

## 2010-05-04 DIAGNOSIS — I959 Hypotension, unspecified: Secondary | ICD-10-CM

## 2010-05-04 DIAGNOSIS — F29 Unspecified psychosis not due to a substance or known physiological condition: Principal | ICD-10-CM

## 2010-05-04 DIAGNOSIS — F1320 Sedative, hypnotic or anxiolytic dependence, uncomplicated: Secondary | ICD-10-CM

## 2010-05-04 DIAGNOSIS — F1994 Other psychoactive substance use, unspecified with psychoactive substance-induced mood disorder: Secondary | ICD-10-CM

## 2010-05-04 DIAGNOSIS — E119 Type 2 diabetes mellitus without complications: Secondary | ICD-10-CM

## 2010-05-04 DIAGNOSIS — T424X4A Poisoning by benzodiazepines, undetermined, initial encounter: Secondary | ICD-10-CM

## 2010-05-04 DIAGNOSIS — Z560 Unemployment, unspecified: Secondary | ICD-10-CM

## 2010-05-04 DIAGNOSIS — Z794 Long term (current) use of insulin: Secondary | ICD-10-CM

## 2010-05-04 DIAGNOSIS — F1010 Alcohol abuse, uncomplicated: Secondary | ICD-10-CM

## 2010-05-04 LAB — CULTURE, BLOOD (ROUTINE X 2): Culture: NO GROWTH

## 2010-05-04 LAB — GLUCOSE, CAPILLARY: Glucose-Capillary: 120 mg/dL — ABNORMAL HIGH (ref 70–99)

## 2010-05-04 NOTE — Discharge Summary (Addendum)
NAMECOTTON, BECKLEY NO.:  [id number]  MEDICAL RECORD NO.:  [id number]           PATIENT TYPE:  I  LOCATION:  5501                         FACILITY:  [facility]  PHYSICIAN:  Lonia Blood, M.D.       DATE OF BIRTH:  [birth year]  DATE OF ADMISSION:  04/28/2010 DATE OF DISCHARGE:  05/04/2010                              DISCHARGE SUMMARY   PRIMARY CARE PROVIDER:  [facility].  DISCHARGE DIAGNOSES: 1. Altered mental status secondary to probable benzo overdose. 2. Aspiration pneumonia. 3. Benzodiazepine overdose. 4. Hypotension. 5. Acute renal failure. 6. Diabetes mellitus. 7. History of EtOH/benzo abuse.  DISCHARGE MEDICATIONS: 1. Amlodipine 10 mg p.o. daily. 2. Augmentin 875 mg p.o. b.i.d. last day May 05, 2010. 3. Colchicine 0.6 mg p.o. b.i.d. 4. Folic acid 1 mg p.o. daily. 5. Hydrochlorothiazide 25 mg p.o. daily. 6. Insulin sliding scale daily at bedtime. 7. Insulin aspart sliding scale t.i.d. with meals. 8. Lorazepam 1 mg p.o. every 8 hours. 9. Multivitamin p.o. daily. 10.Potassium chloride 10 mEq p.o. daily. 11.Thiamine 100 mg p.o. daily.  PERTINENT LABS:  On admission.  Hemoglobin 17.3, hematocrit 51.0. Sodium 129, potassium 4.0, chloride 96, CO2 23, BUN 71, creatinine 11.2, glucose 128.  Urine was negative.  PTT 28,  PT 15.5, INR 1.21, CK-MB 75.6, myoglobin 305, troponin 1 less than 0.05.  Urine drug screen positive for benzodiazepine.  Ammonia level 60.  Lactic acid 2.9. Arterial blood gas yields pH of 7.31, pCO2 of 33.2, pO2 of 79.0, bicarb 17.1, total CO2 18.  Acetaminophen level less than 10.0.  Hepatic function;  Total bilirubin 1.6, direct bili 0.3, indirect bili 1.3, AST 74, ALT 65, lipase 17.  Salicylate level less than 4.0.  A second set CK- MB 97.7, total creatinine kinase 7304,  Procalcitonin 10.08, MRSA screening was negative.  Magnesium level 1.9, phosphorus 7.4.  On discharge; sodium 142, potassium 3.5, chloride 107, CO2 25, BUN  7, creatinine 0.74, glucose 89, AST 50, total protein 5.5, albumin 2.7. WBC 9.0, hemoglobin 12.6, hematocrit 35.5.  RADIOLOGY:  CT of the head on February 9 yields interval development of tiny bilateral frontal periventricular white matter hypodensities. Generally, these indicate small vessel ischemic change, but Rh indeterminate.  No focal vascular infarct or acute hemorrhage. Chest x-ray done on February 9, yielded satisfactory endotracheal tube position.  Right basilar atelectasis versus air space disease.  Chest x- ray done on February 9 yields endotracheal tube is again identified with tip 4-cm above carina.  A right IJ central venous catheter is in place with tip overlying the mid SVC.  Further atelectasis/collapse of the right lower lung was noted.  No evidence of pneumothorax.  Chest x-ray done on February 9, no change in right base atelectasis and volume loss. Ultrasound of kidney done on February 9, normal bilateral renal ultrasound.  No evidence of hydronephrosis or other abnormality.  Chest x-ray done on February 10, perihilar infiltrates extending to right lung base with increase in right lung infiltrate.  Chest x-ray done on February 11, extubation with low volumes, worsening bibasilar atelectasis, infiltrates.  PROCEDURES:  EEG done  February 10 was normal.  CONSULT:  Dr. Eulogio Ditch from Psychiatry on February 14.  BRIEF SUMMARY:  The patient was found unresponsive in his home in bed on February 9.  EMS was called.  On arrival to the emergency room the patient was unresponsive with agonal respirations, no gag reflex, and hypotensive.  The patient was intubated by the emergency room physician. Initial evaluation included a creatinine of 11.2.  An urine drug screen positive for benzos and portable chest x-ray with right lower lobe infiltrate.  Emergency room physician noting food particles in the airway during intubation.  PCCM admitted the patient to the ICU  with altered mental status, respiratory failure, dehydration, and renal failure.  The patient responded to volume resuscitation and pressors. On February 10, he was off pressors and more responsive and extubated. The patient was placed on CIWA protocol for history of EtOH abuse.  The patient continued to improve and was transferred to the [facility] hospitalist service on February 12.  HOSPITAL COURSE BY PROBLEMS: 1. Altered mental status probably secondary to an overdose of benzos,     there was a question of accidental or intentional.  The patient has     a history of same.  Initially found unresponsive, hypotensive, was     admitted to the ICU, intubated, placed on pressors.  Responded to     volume resuscitation and medications.  By February 10 was more     responsive, by February 11 was alert transferred to medical floor     and to the [facility] hospitalist service where he was alert, oriented     to self intermittently confused.  At the time of discharge the     patient continues to be alert, cooperative, oriented to self and     place demonstrating some intermittent disorientation. 2. Aspiration pneumonia secondary to #1. The patient was treated with     Unasyn and vancomycin.  At discharge will go on Augmentin b.i.d.     course to end February 16. 3. Benzodiazepine overdose.  There was some concern about this being     accidental versus intentional as the patient has a history of same.     Social work met with the patient and family.  Psychiatry evaluated     the patient on February 14 and yielded him not suicidal.  The     patient denied suicide attempt, stated that he just took extra     medication secondary to his pain.  The patient does have a history     of depression.  Is being transferred to behavioral health for     treatment of his depression as well as stabilization of medication. 4. Hypotension.  Upon presentation, the patient was hypotensive.     Treated in the ICU with  vasopressors, responded appropriately was     off pressors by February 10.  Has a history of hypertension during     his hospitalization, blood pressure elevated.  The patient started     on HCTZ and amlodipine with good control.  He will continue. 5. Acute renal failure.  During initial period of the overdose and     hypotension.  Peak creatinine was 11.2.  The patient responded to     fluid resuscitation in the ICU.  Urine output is adequate and     creatinine level at time of discharge is 0.74.  Renal ultrasound     negative. 6. Diabetes.  The patient has a reported history  of diabetes.  He is     not on any medications.  Has been managed with sliding scale at     bedtime and with meals.  Will continue this management at     behavioral health. 7. History of EtOH abuse.  Social work has met with family.     Psychiatry has met with family and the patient. Recommending     behavioral health inpatient treatment for depression, stabilization     of medication, and possible detox.  The patient will be continued     on Ativan p.o. 1 mg q.8 h.  PHYSICAL EXAMINATION:  VITAL SIGNS:  Temperature 97.2, blood pressure 111/77, heart rate 77, respiration 19, sats 93% on room air. GENERAL: Awake, alert, sitting on the side of the bed eating breakfast. Smiling, no acute distress. CV: Regular rate and rhythm.  No murmur, gallop or rub.  No lower extremity edema. RESPIRATORY:  Normal effort.  No wheezes, rhonchi, or rales. ABDOMEN: Flat, soft, positive bowel sounds throughout, nontender to palpation.  DISPOSITION:  The patient is being transferred to behavioral health for inpatient treatment of depression and stabilization of medications.  FOLLOWUP:  The patient to see a physician at [facility] after discharge from the behavioral health.  CONDITION ON DISCHARGE:  Stable.  Time spent on this discharge is 40 minutes.     Gwenyth Bender, NP   ______________________________ Lonia Blood,  M.D.    KMB/MEDQ  D:  05/04/2010  T:  05/04/2010  Job:  [id number]  Electronically Signed by Lonia Blood M.D. on 05/04/2010 05:52:11 PM Electronically Signed by Toya Smothers  on 05/15/2010 08:28:50 AM

## 2010-05-05 DIAGNOSIS — F29 Unspecified psychosis not due to a substance or known physiological condition: Secondary | ICD-10-CM

## 2010-05-05 LAB — GLUCOSE, CAPILLARY

## 2010-05-06 LAB — GLUCOSE, CAPILLARY
Glucose-Capillary: 112 mg/dL — ABNORMAL HIGH (ref 70–99)
Glucose-Capillary: 119 mg/dL — ABNORMAL HIGH (ref 70–99)

## 2010-05-07 LAB — GLUCOSE, CAPILLARY
Glucose-Capillary: 115 mg/dL — ABNORMAL HIGH (ref 70–99)
Glucose-Capillary: 124 mg/dL — ABNORMAL HIGH (ref 70–99)

## 2010-05-08 LAB — GLUCOSE, CAPILLARY
Glucose-Capillary: 120 mg/dL — ABNORMAL HIGH (ref 70–99)
Glucose-Capillary: 107 mg/dL — ABNORMAL HIGH (ref 70–99)
Glucose-Capillary: 115 mg/dL — ABNORMAL HIGH (ref 70–99)

## 2010-05-08 NOTE — Consult Note (Signed)
NAMEEGIDIO, LOFGREN NO.:  [id number]  MEDICAL RECORD NO.:  [id number]           PATIENT TYPE:  I  LOCATION:  5501                         FACILITY:  [facility]  PHYSICIAN:  Eulogio Ditch, MD DATE OF BIRTH:  09/09/1970  DATE OF CONSULTATION:  05/03/2010 DATE OF DISCHARGE:                                CONSULTATION   REASON FOR CONSULT:  History of depression and questionable history of suicide attempt.  HISTORY OF PRESENT ILLNESS:  I saw the patient, reviewed the medical records, I also spoke with the nursing staff.  A 40 year old male who was admitted at which time in August 2011 and was seen by me and the patient at that time overdosed on his medication in attempt to relieve his pain, but he told me that it was not a suicide attempt.  At this time also the patient was saying he just took extra medication, but he was not trying to kill himself.  The patient has a history of depression and is on Zoloft and Klonopin.  The patient was admitted on February 2009 and was intubated.  His UDS was positive for benzos.  The patient denies hearing any voices.  He is not internally preoccupied, but he is confused.  PAST PSYCH HISTORY:  History of depression, he is on Zoloft and Klonopin.  No past history of suicide attempt or admission to Psychiatry.  SUBSTANCE ABUSE HISTORY:  The patient has history of polysubstance abuse, over using Klonopin and the pain pills.  SOCIAL HISTORY:  The patient lives with the wife.  FAMILY HISTORY:  No history of suicide attempt in the family.  MENTAL STATUS EXAM:  The patient is fairly cooperative during interview. He is confused, anxious.  Speech is soft, slow, not logical, and goal directed at this time.  Denies any suicidal ideations, he is not delusional.  Denies hearing any voices.  He does not seem to be internally preoccupied.  Cognition, alert, awake, oriented to place and person, but not to time.  Memory, immediate,  recent remote poor. Attention and concentration poor.  Abstraction ability poor.  Insight and judgment poor.  DIAGNOSES:  Axis I:  Major depressive disorder recurrent type, anxiety disorder NOS. Axis II:  Deferred. Axis III:  See medical notes. Axis IV:  Chronic mental issues. Axis V:  40 to 50.  RECOMMENDATIONS: 1. This patient will get benefit by coming to behavioral health for     treatment of his depression, stabilization of medication, and for     providing support in the outpatient setting.  The patient denies     suicidal ideation, but he is danger to himself at this time.  The     patient agrees to come to behavioral health.  Wife should be     involved in the treatment. 2. The patient might be withdrawing from the benzos and pain     medications, that is why the patient is confused today, but the     patient is on Ativan as needed, but not on any pain medications.     The patient should be on  clonidine detox protocol to prevent any     withdrawal symptoms.  The patient was also getting Ativan     regularly.  The patient should be given Klonopin to prevent any     withdrawal symptoms from benzos. 3. I will follow up on this patient as needed.  I tried to contact Dr.     Irene Limbo, but he is unavailable at this time.  I discussed the     disposition with the nursing staff.  The patient does not need to     be on a suicide precaution.     Eulogio Ditch, MD     SA/MEDQ  D:  05/03/2010  T:  05/03/2010  Job:  [id number]  Electronically Signed by Eulogio Ditch  on 05/08/2010 06:08:17 AM

## 2010-05-10 LAB — GLUCOSE, CAPILLARY: Glucose-Capillary: 118 mg/dL — ABNORMAL HIGH (ref 70–99)
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
## 2010-05-12 LAB — GLUCOSE, CAPILLARY: Glucose-Capillary: 90 mg/dL (ref 70–99)

## 2010-05-13 ENCOUNTER — Encounter (INDEPENDENT_AMBULATORY_CARE_PROVIDER_SITE_OTHER): Payer: Self-pay | Admitting: *Deleted

## 2010-05-13 LAB — CONVERTED CEMR LAB
ALT: 24 units/L (ref 0–53)
AST: 22 units/L (ref 0–37)
Albumin: 4.5 g/dL (ref 3.5–5.2)
Alkaline Phosphatase: 68 units/L (ref 39–117)
Potassium: 4 meq/L (ref 3.5–5.3)
Sodium: 140 meq/L (ref 135–145)
Total Protein: 7.1 g/dL (ref 6.0–8.3)

## 2010-05-31 LAB — COMPREHENSIVE METABOLIC PANEL
Albumin: 4.9 g/dL (ref 3.5–5.2)
BUN: 11 mg/dL (ref 6–23)
Chloride: 107 mEq/L (ref 96–112)
Creatinine, Ser: 1.04 mg/dL (ref 0.4–1.5)
Total Bilirubin: 1.2 mg/dL (ref 0.3–1.2)
Total Protein: 8 g/dL (ref 6.0–8.3)

## 2010-05-31 LAB — URINALYSIS, ROUTINE W REFLEX MICROSCOPIC
Ketones, ur: NEGATIVE mg/dL
Nitrite: NEGATIVE
Protein, ur: NEGATIVE mg/dL
Urobilinogen, UA: 0.2 mg/dL (ref 0.0–1.0)
pH: 5 (ref 5.0–8.0)

## 2010-05-31 LAB — DIFFERENTIAL
Basophils Absolute: 0.1 10*3/uL (ref 0.0–0.1)
Lymphocytes Relative: 35 % (ref 12–46)
Monocytes Absolute: 0.8 10*3/uL (ref 0.1–1.0)
Neutro Abs: 5.9 10*3/uL (ref 1.7–7.7)
Neutrophils Relative %: 55 % (ref 43–77)

## 2010-05-31 LAB — GLUCOSE, CAPILLARY: Glucose-Capillary: 143 mg/dL — ABNORMAL HIGH (ref 70–99)

## 2010-05-31 LAB — CBC
MCH: 30 pg (ref 26.0–34.0)
MCHC: 36 g/dL (ref 30.0–36.0)
Platelets: 208 10*3/uL (ref 150–400)

## 2010-05-31 LAB — RAPID URINE DRUG SCREEN, HOSP PERFORMED
Cocaine: NOT DETECTED
Tetrahydrocannabinol: NOT DETECTED

## 2010-05-31 LAB — CK: Total CK: 109 U/L (ref 7–232)

## 2010-05-31 LAB — ETHANOL: Alcohol, Ethyl (B): <5 mg/dL (ref 0–10)
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
## 2010-06-01 LAB — GLUCOSE, CAPILLARY: Glucose-Capillary: 137 mg/dL — ABNORMAL HIGH (ref 70–99)

## 2010-06-03 ENCOUNTER — Emergency Department (HOSPITAL_COMMUNITY)
Admission: EM | Admit: 2010-06-03 | Discharge: 2010-06-03 | Disposition: A | Discharge status: Home / Self Care | Payer: Medicaid Other | Attending: Emergency Medicine | Admitting: Emergency Medicine

## 2010-06-03 DIAGNOSIS — E119 Type 2 diabetes mellitus without complications: Secondary | ICD-10-CM | POA: Insufficient documentation

## 2010-06-03 DIAGNOSIS — I10 Essential (primary) hypertension: Secondary | ICD-10-CM | POA: Insufficient documentation

## 2010-06-03 DIAGNOSIS — R209 Unspecified disturbances of skin sensation: Secondary | ICD-10-CM | POA: Insufficient documentation

## 2010-06-03 LAB — COMPREHENSIVE METABOLIC PANEL
ALT: 59 U/L — ABNORMAL HIGH (ref 0–53)
AST: 34 U/L (ref 0–37)
Alkaline Phosphatase: 53 U/L (ref 39–117)
BUN: 7 mg/dL (ref 6–23)
CO2: 25 mEq/L (ref 19–32)
Calcium: 8.8 mg/dL (ref 8.4–10.5)
Calcium: 9 mg/dL (ref 8.4–10.5)
Chloride: 110 mEq/L (ref 96–112)
Creatinine, Ser: 0.79 mg/dL (ref 0.4–1.5)
GFR calc Af Amer: >60 mL/min (ref >60)
GFR calc non Af Amer: >60 mL/min (ref >60)
Glucose, Bld: 93 mg/dL (ref 70–99)
Glucose, Bld: 98 mg/dL (ref 70–99)
Potassium: 3.7 mEq/L (ref 3.5–5.1)
Total Bilirubin: 0.7 mg/dL (ref 0.3–1.2)
Total Protein: 5.6 g/dL — ABNORMAL LOW (ref 6.0–8.3)

## 2010-06-03 LAB — CBC
HCT: 42.4 % (ref 39.0–52.0)
HCT: 43.8 % (ref 39.0–52.0)
Hemoglobin: 15.9 g/dL (ref 13.0–17.0)
MCH: 28.1 pg (ref 26.0–34.0)
MCH: 29.8 pg (ref 26.0–34.0)
MCHC: 34.9 g/dL (ref 30.0–36.0)
MCHC: 35.5 g/dL (ref 30.0–36.0)
MCHC: 36.3 g/dL — ABNORMAL HIGH (ref 30.0–36.0)
MCV: 80.6 fL (ref 78.0–100.0)
Platelets: 142 10*3/uL — ABNORMAL LOW (ref 150–400)
RBC: 5.34 MIL/uL (ref 4.22–5.81)
RDW: 12.8 % (ref 11.5–15.5)
RDW: 12.9 % (ref 11.5–15.5)
WBC: 10.1 10*3/uL (ref 4.0–10.5)

## 2010-06-03 LAB — LIPASE, BLOOD: Lipase: 27 U/L (ref 11–59)

## 2010-06-03 LAB — RAPID URINE DRUG SCREEN, HOSP PERFORMED
Barbiturates: NOT DETECTED
Cocaine: NOT DETECTED
Opiates: NOT DETECTED

## 2010-06-03 LAB — BASIC METABOLIC PANEL
BUN: 5 mg/dL — ABNORMAL LOW (ref 6–23)
BUN: 8 mg/dL (ref 6–23)
Calcium: 9 mg/dL (ref 8.4–10.5)
Calcium: 9 mg/dL (ref 8.4–10.5)
Creatinine, Ser: 0.84 mg/dL (ref 0.4–1.5)
GFR calc non Af Amer: >60 mL/min (ref >60)
GFR calc non Af Amer: >60 mL/min (ref >60)
Glucose, Bld: 100 mg/dL — ABNORMAL HIGH (ref 70–99)
Glucose, Bld: 122 mg/dL — ABNORMAL HIGH (ref 70–99)
Sodium: 138 mEq/L (ref 135–145)

## 2010-06-03 LAB — MAGNESIUM
Magnesium: 1.7 mg/dL (ref 1.5–2.5)
Magnesium: 1.8 mg/dL (ref 1.5–2.5)

## 2010-06-03 LAB — DIFFERENTIAL
Basophils Absolute: 0 10*3/uL (ref 0.0–0.1)
Eosinophils Absolute: 0.2 10*3/uL (ref 0.0–0.7)
Eosinophils Relative: 2 % (ref 0–5)
Lymphs Abs: 3.6 10*3/uL (ref 0.7–4.0)
Neutrophils Relative %: 55 % (ref 43–77)

## 2010-06-03 LAB — URINALYSIS, ROUTINE W REFLEX MICROSCOPIC
Nitrite: NEGATIVE
Specific Gravity, Urine: 1.008 (ref 1.005–1.030)
Urobilinogen, UA: 0.2 mg/dL (ref 0.0–1.0)
pH: 5.5 (ref 5.0–8.0)

## 2010-06-03 LAB — HEMOCCULT GUIAC POC 1CARD (OFFICE): Fecal Occult Bld: POSITIVE

## 2010-06-03 LAB — GLUCOSE, CAPILLARY: Glucose-Capillary: 99 mg/dL (ref 70–99)

## 2010-06-08 LAB — CBC
HCT: 47.7 % (ref 39.0–52.0)
Hemoglobin: 16.9 g/dL (ref 13.0–17.0)
RBC: 5.59 MIL/uL (ref 4.22–5.81)
WBC: 8.2 10*3/uL (ref 4.0–10.5)

## 2010-06-08 LAB — BASIC METABOLIC PANEL
GFR calc Af Amer: >60 mL/min (ref >60)
GFR calc non Af Amer: >60 mL/min (ref >60)
Potassium: 3.6 mEq/L (ref 3.5–5.1)
Sodium: 139 mEq/L (ref 135–145)

## 2010-06-08 LAB — GLUCOSE, CAPILLARY: Glucose-Capillary: 112 mg/dL — ABNORMAL HIGH (ref 70–99)

## 2010-06-08 LAB — DIFFERENTIAL
Eosinophils Relative: 1 % (ref 0–5)
Lymphocytes Relative: 14 % (ref 12–46)
Lymphs Abs: 1.1 10*3/uL (ref 0.7–4.0)
Monocytes Absolute: 1.3 10*3/uL — ABNORMAL HIGH (ref 0.1–1.0)

## 2010-06-22 LAB — COMPREHENSIVE METABOLIC PANEL
ALT: 45 U/L (ref 0–53)
Albumin: 3.4 g/dL — ABNORMAL LOW (ref 3.5–5.2)
Albumin: 3.8 g/dL (ref 3.5–5.2)
Alkaline Phosphatase: 56 U/L (ref 39–117)
Alkaline Phosphatase: 79 U/L (ref 39–117)
BUN: 17 mg/dL (ref 6–23)
BUN: 3 mg/dL — ABNORMAL LOW (ref 6–23)
CO2: 13 mEq/L — ABNORMAL LOW (ref 19–32)
Calcium: 8.3 mg/dL — ABNORMAL LOW (ref 8.4–10.5)
Calcium: 8.4 mg/dL (ref 8.4–10.5)
Calcium: 9.4 mg/dL (ref 8.4–10.5)
Creatinine, Ser: 0.98 mg/dL (ref 0.4–1.5)
GFR calc Af Amer: >60 mL/min (ref >60)
GFR calc Af Amer: >60 mL/min (ref >60)
GFR calc non Af Amer: 59 mL/min — ABNORMAL LOW (ref >60)
Glucose, Bld: 138 mg/dL — ABNORMAL HIGH (ref 70–99)
Glucose, Bld: 58 mg/dL — ABNORMAL LOW (ref 70–99)
Potassium: 3.6 mEq/L (ref 3.5–5.1)
Potassium: 3.8 mEq/L (ref 3.5–5.1)
Sodium: 132 mEq/L — ABNORMAL LOW (ref 135–145)
Total Protein: 5.8 g/dL — ABNORMAL LOW (ref 6.0–8.3)
Total Protein: 6.5 g/dL (ref 6.0–8.3)
Total Protein: 8.5 g/dL — ABNORMAL HIGH (ref 6.0–8.3)

## 2010-06-22 LAB — URINALYSIS, ROUTINE W REFLEX MICROSCOPIC
Bilirubin Urine: NEGATIVE
Glucose, UA: NEGATIVE mg/dL
Ketones, ur: >80 mg/dL — AB
Protein, ur: 100 mg/dL — AB
pH: 5 (ref 5.0–8.0)

## 2010-06-22 LAB — CBC
HCT: 45.1 % (ref 39.0–52.0)
HCT: 60.3 % — ABNORMAL HIGH (ref 39.0–52.0)
Hemoglobin: 17 g/dL (ref 13.0–17.0)
Hemoglobin: 20.3 g/dL — ABNORMAL HIGH (ref 13.0–17.0)
MCHC: 33.6 g/dL (ref 30.0–36.0)
MCHC: 34.9 g/dL (ref 30.0–36.0)
MCV: 88.7 fL (ref 78.0–100.0)
Platelets: 126 10*3/uL — ABNORMAL LOW (ref 150–400)
Platelets: 175 10*3/uL (ref 150–400)
RBC: 6.7 MIL/uL — ABNORMAL HIGH (ref 4.22–5.81)
RDW: 14.2 % (ref 11.5–15.5)
RDW: 14.3 % (ref 11.5–15.5)
RDW: 14.6 % (ref 11.5–15.5)

## 2010-06-22 LAB — GLUCOSE, CAPILLARY
Glucose-Capillary: 105 mg/dL — ABNORMAL HIGH (ref 70–99)
Glucose-Capillary: 106 mg/dL — ABNORMAL HIGH (ref 70–99)
Glucose-Capillary: 109 mg/dL — ABNORMAL HIGH (ref 70–99)
Glucose-Capillary: 111 mg/dL — ABNORMAL HIGH (ref 70–99)
Glucose-Capillary: 135 mg/dL — ABNORMAL HIGH (ref 70–99)
Glucose-Capillary: 162 mg/dL — ABNORMAL HIGH (ref 70–99)

## 2010-06-22 LAB — CULTURE, BLOOD (ROUTINE X 2)

## 2010-06-22 LAB — DIFFERENTIAL
Basophils Absolute: 0 10*3/uL (ref 0.0–0.1)
Basophils Absolute: 0 10*3/uL (ref 0.0–0.1)
Eosinophils Absolute: 0 10*3/uL (ref 0.0–0.7)
Eosinophils Relative: 0 % (ref 0–5)
Lymphocytes Relative: 20 % (ref 12–46)
Lymphs Abs: 1.2 10*3/uL (ref 0.7–4.0)
Lymphs Abs: 1.8 10*3/uL (ref 0.7–4.0)
Monocytes Absolute: 0.5 10*3/uL (ref 0.1–1.0)
Monocytes Absolute: 1.1 10*3/uL — ABNORMAL HIGH (ref 0.1–1.0)
Monocytes Absolute: 1.2 10*3/uL — ABNORMAL HIGH (ref 0.1–1.0)
Monocytes Relative: 7 % (ref 3–12)
Monocytes Relative: 8 % (ref 3–12)
Neutro Abs: 12 10*3/uL — ABNORMAL HIGH (ref 1.7–7.7)
Neutro Abs: 4.1 10*3/uL (ref 1.7–7.7)
Neutrophils Relative %: 80 % — ABNORMAL HIGH (ref 43–77)
Neutrophils Relative %: 88 % — ABNORMAL HIGH (ref 43–77)

## 2010-06-22 LAB — RAPID URINE DRUG SCREEN, HOSP PERFORMED
Amphetamines: NOT DETECTED
Benzodiazepines: NOT DETECTED
Cocaine: NOT DETECTED
Opiates: NOT DETECTED
Tetrahydrocannabinol: POSITIVE — AB

## 2010-06-22 LAB — LIPASE, BLOOD: Lipase: <10 U/L — ABNORMAL LOW (ref 11–59)

## 2010-06-22 LAB — URINE CULTURE

## 2010-06-24 ENCOUNTER — Ambulatory Visit (HOSPITAL_COMMUNITY): Payer: Medicaid Other | Admitting: Psychiatry

## 2010-06-24 LAB — CBC
HCT: 48.2 % (ref 39.0–52.0)
Hemoglobin: 16.6 g/dL (ref 13.0–17.0)
MCHC: 34.5 g/dL (ref 30.0–36.0)
RDW: 13 % (ref 11.5–15.5)

## 2010-06-24 LAB — DIFFERENTIAL
Basophils Absolute: 0 10*3/uL (ref 0.0–0.1)
Basophils Relative: 0 % (ref 0–1)
Eosinophils Relative: 2 % (ref 0–5)
Monocytes Absolute: 0.8 10*3/uL (ref 0.1–1.0)
Neutro Abs: 5.5 10*3/uL (ref 1.7–7.7)

## 2010-06-24 LAB — BASIC METABOLIC PANEL
BUN: 8 mg/dL (ref 6–23)
CO2: 31 mEq/L (ref 19–32)
Calcium: 9.2 mg/dL (ref 8.4–10.5)
Glucose, Bld: 103 mg/dL — ABNORMAL HIGH (ref 70–99)
Sodium: 139 mEq/L (ref 135–145)

## 2010-06-25 LAB — POCT CARDIAC MARKERS
Myoglobin, poc: 70.6 ng/mL (ref 12–200)
Troponin i, poc: <0.05 ng/mL (ref 0.00–0.09)

## 2010-06-25 LAB — BASIC METABOLIC PANEL
BUN: 8 mg/dL (ref 6–23)
Creatinine, Ser: 1 mg/dL (ref 0.4–1.5)
GFR calc non Af Amer: >60 mL/min (ref >60)

## 2010-06-25 LAB — DIFFERENTIAL
Basophils Absolute: 0 10*3/uL (ref 0.0–0.1)
Basophils Relative: 0 % (ref 0–1)
Eosinophils Absolute: 0.1 10*3/uL (ref 0.0–0.7)
Eosinophils Relative: 2 % (ref 0–5)
Lymphocytes Relative: 29 % (ref 12–46)
Lymphs Abs: 3 10*3/uL (ref 0.7–4.0)
Neutrophils Relative %: 62 % (ref 43–77)

## 2010-06-25 LAB — POCT I-STAT, CHEM 8
BUN: 13 mg/dL (ref 6–23)
Calcium, Ion: 1.16 mmol/L (ref 1.12–1.32)
Glucose, Bld: 89 mg/dL (ref 70–99)
HCT: 50 % (ref 39.0–52.0)
TCO2: 31 mmol/L (ref 0–100)

## 2010-06-25 LAB — URINALYSIS, ROUTINE W REFLEX MICROSCOPIC
Bilirubin Urine: NEGATIVE
Nitrite: NEGATIVE
Protein, ur: NEGATIVE mg/dL

## 2010-06-25 LAB — CBC
MCV: 83.7 fL (ref 78.0–100.0)
Platelets: 176 10*3/uL (ref 150–400)
Platelets: 193 10*3/uL (ref 150–400)
WBC: 10.4 10*3/uL (ref 4.0–10.5)
WBC: 8.8 10*3/uL (ref 4.0–10.5)

## 2010-06-28 LAB — URINALYSIS, ROUTINE W REFLEX MICROSCOPIC
Bilirubin Urine: NEGATIVE
Glucose, UA: NEGATIVE mg/dL
Ketones, ur: NEGATIVE mg/dL
Leukocytes, UA: NEGATIVE
Protein, ur: 30 mg/dL — AB

## 2010-06-28 LAB — CBC
HCT: 48.4 % (ref 39.0–52.0)
Hemoglobin: 17 g/dL (ref 13.0–17.0)
RBC: 5.69 MIL/uL (ref 4.22–5.81)
RDW: 13 % (ref 11.5–15.5)

## 2010-06-28 LAB — COMPREHENSIVE METABOLIC PANEL
ALT: 32 U/L (ref 0–53)
Alkaline Phosphatase: 63 U/L (ref 39–117)
BUN: 11 mg/dL (ref 6–23)
CO2: 27 mEq/L (ref 19–32)
Chloride: 108 mEq/L (ref 96–112)
GFR calc non Af Amer: >60 mL/min (ref >60)
Glucose, Bld: 115 mg/dL — ABNORMAL HIGH (ref 70–99)
Potassium: 3.8 mEq/L (ref 3.5–5.1)
Sodium: 141 mEq/L (ref 135–145)
Total Bilirubin: 1.3 mg/dL — ABNORMAL HIGH (ref 0.3–1.2)
Total Protein: 6.6 g/dL (ref 6.0–8.3)

## 2010-06-28 LAB — URINE MICROSCOPIC-ADD ON

## 2010-06-28 LAB — DIFFERENTIAL
Basophils Absolute: 0 10*3/uL (ref 0.0–0.1)
Basophils Relative: 0 % (ref 0–1)
Eosinophils Absolute: 0.1 10*3/uL (ref 0.0–0.7)
Monocytes Relative: 7 % (ref 3–12)
Neutro Abs: 6.6 10*3/uL (ref 1.7–7.7)
Neutrophils Relative %: 68 % (ref 43–77)

## 2010-06-29 LAB — URINALYSIS, ROUTINE W REFLEX MICROSCOPIC
Hgb urine dipstick: NEGATIVE
Nitrite: NEGATIVE
Specific Gravity, Urine: 1.02 (ref 1.005–1.030)
Urobilinogen, UA: 1 mg/dL (ref 0.0–1.0)
pH: 7.5 (ref 5.0–8.0)

## 2010-06-29 LAB — DIFFERENTIAL
Lymphs Abs: 2.2 10*3/uL (ref 0.7–4.0)
Monocytes Absolute: 0.6 10*3/uL (ref 0.1–1.0)
Monocytes Relative: 6 % (ref 3–12)
Neutro Abs: 7.6 10*3/uL (ref 1.7–7.7)
Neutrophils Relative %: 73 % (ref 43–77)

## 2010-06-29 LAB — COMPREHENSIVE METABOLIC PANEL
ALT: 28 U/L (ref 0–53)
AST: 20 U/L (ref 0–37)
CO2: 27 mEq/L (ref 19–32)
Calcium: 8.8 mg/dL (ref 8.4–10.5)
Creatinine, Ser: 1.1 mg/dL (ref 0.4–1.5)
GFR calc Af Amer: >60 mL/min (ref >60)
GFR calc non Af Amer: >60 mL/min (ref >60)
Sodium: 138 mEq/L (ref 135–145)
Total Protein: 6.2 g/dL (ref 6.0–8.3)

## 2010-06-29 LAB — CBC
Hemoglobin: 16.9 g/dL (ref 13.0–17.0)
RBC: 5.67 MIL/uL (ref 4.22–5.81)
WBC: 10.5 10*3/uL (ref 4.0–10.5)

## 2010-06-29 LAB — GLUCOSE, CAPILLARY: Glucose-Capillary: 117 mg/dL — ABNORMAL HIGH (ref 70–99)

## 2010-06-30 LAB — GLUCOSE, CAPILLARY

## 2010-07-05 LAB — DIFFERENTIAL
Basophils Absolute: 0.1 10*3/uL (ref 0.0–0.1)
Basophils Relative: 1 % (ref 0–1)
Eosinophils Absolute: 0.2 10*3/uL (ref 0.0–0.7)
Monocytes Relative: 11 % (ref 3–12)
Neutrophils Relative %: 60 % (ref 43–77)

## 2010-07-05 LAB — CBC
Hemoglobin: 16.1 g/dL (ref 13.0–17.0)
RBC: 5.46 MIL/uL (ref 4.22–5.81)
WBC: 9.1 10*3/uL (ref 4.0–10.5)

## 2010-07-05 LAB — URINALYSIS, ROUTINE W REFLEX MICROSCOPIC
Hgb urine dipstick: NEGATIVE
Protein, ur: NEGATIVE mg/dL
Urobilinogen, UA: 1 mg/dL (ref 0.0–1.0)

## 2010-07-05 LAB — COMPREHENSIVE METABOLIC PANEL
ALT: 24 U/L (ref 0–53)
Alkaline Phosphatase: 63 U/L (ref 39–117)
CO2: 26 mEq/L (ref 19–32)
Chloride: 106 mEq/L (ref 96–112)
GFR calc non Af Amer: >60 mL/min (ref >60)
Glucose, Bld: 95 mg/dL (ref 70–99)
Potassium: 3.4 mEq/L — ABNORMAL LOW (ref 3.5–5.1)
Sodium: 138 mEq/L (ref 135–145)
Total Bilirubin: 0.8 mg/dL (ref 0.3–1.2)
Total Protein: 6.3 g/dL (ref 6.0–8.3)

## 2010-07-22 ENCOUNTER — Ambulatory Visit (HOSPITAL_COMMUNITY): Payer: Medicaid Other | Admitting: Psychiatry

## 2010-08-02 NOTE — Consult Note (Signed)
Dustin Hansen, Dustin [name]:  [id number]   MEDICAL RECORD NO.:  [id number]          PATIENT TYPE:  EMS   LOCATION:  MAJO                         FACILITY:  [facility]   PHYSICIAN:  Bernette Redbird, M.D.   DATE OF BIRTH:  [birth year]   DATE OF CONSULTATION:  12/31/2007  DATE OF DISCHARGE:  12/31/2007                                 CONSULTATION   Dr. Clarene Duke of the emergency room staff asked [place] to see this 40 year old  [place] immigrant because of nausea, vomiting, diarrhea, and  abdominal pain.   HISTORY:  This patient is known peripherally to my partner, Dr. Ewing Schlein,  who has seen him on a few occasions in the past and he has also been  seen sporadically in some of the primary care offices of [facility] (Dr. Wynelle Link and Dr. Leonides Sake).  Unfortunately, the picture is  clouded by a patient with innumerable ER visits for various somatic  complaints, as well as inconsistency of medical followup.   With that background, he had called our office on several occasions  recently and was apparently directed to an urgent care center for  evaluation where he was seen this morning.  An x-ray raised a question  of some dilated loops of small bowel and he was sent to the [facility]  Emergency Room where he has been for a number of hours.   Although the history is inconsistent between examiners, it sounds as  though there has been vomiting, irregularity of bowel habit (possibly  diarrhea, possibly constipation), and rather diffuse abdominal pain.   Endoscopy by Dr. Ewing Schlein in March 2009, was unrevealing.  The patient had  his gallbladder out a year ago.   PAST MEDICAL HISTORY:  No definite medication allergies, although  apparently he has itching to several medicines including IBUPROFEN,  CYCLOBENZAPRINE, and HYDROCODONE.   Outpatient medications (it is not at all clear which of these he is  using or on what schedule) include Valium, gabapentin, ibuprofen,  promethazine, Reglan, Librium, colchicine, indomethacin, Percocet,  naproxen, Endocet, meclizine, amitriptyline, omeprazole,  cyclobenzaprine, fluoxetine, oxycodone.   Operations include a laparoscopic cholecystectomy in October 2008.   Medical illnesses include chronic back and neck pain, history of GERD,  gout, depression, chronic constipation.   HABITS:  The patient does smoke, but is a nondrinker and does not use  illicit drugs.   Family history is negative for colon cancer or ulcers.   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   REVIEW OF SYSTEMS:  See HPI.   PHYSICAL EXAMINATION:  GENERAL:  This is a very well-developed, well-  nourished individual, lying in bed in absolutely no acute distress  whatsoever, with multiple tattoos.  VITAL SIGNS:  Temperature 97.5, blood pressure 134/91, pulse 81,  respirations 12 and unlabored.  HEENT:  He is anicteric.  He is without overt pallor.  CHEST:  Clear.  HEART:  Normal without gallops, rubs, murmurs, clicks, or arrhythmias.  ABDOMEN:  Very  active normal bowel sounds.  No succussion splash.  No  bruits.  A little bit of scattered tympani.  No mass effect.  No  palpable organomegaly.  No objective guarding (there is some  touchiness which is rather inconsistent to examine).  No peritoneal  findings.  No objective tenderness.  RECTAL:  A basically empty rectal ampulla with a small amount of  granular, brown, Hemoccult-negative stool (bedside test by [place]).   LABORATORIES:  Hemoglobin 16.9 which is similar to what previous values  have been for this patient, white count 8600, platelets 182,000.  Liver  chemistries normal.  BMET normal including BUN 10 and creatinine 0.89  despite stated history of vomiting.  Urinalysis is normal including  specific gravity of 1.015.  Lipase normal at 21.   Plain abdominal films (reviewed).  There is  some gas filled, but not  really dilated loops of small bowel in the right lower quadrant without  an obstructive pattern.  There is some scattered stool, no overt fecal  obstipation.  There is a little bit of gas distally.  Overall, it is a  nonspecific bowel gas pattern.   CT scan of abdomen and pelvis.  This was done about 6 weeks ago and  showed fatty liver, but no acute abnormalities.   IMPRESSION:  The history is difficult from this patient partly due to  language barrier, partly due to an unfocused and inconsistent history,  forcing [place] to rely more on objective parameters.   OBJECTIVE:  This is not an ill patient.  He is in no distress and he  certainly does not show chronic malnutrition.  His objective exam is  negative for abdominal tenderness and is somewhat inconsistent, he is  Hemoccult negative and he has excellent bowel sounds.  Laboratory  studies support this impression with a normal white count, absence of  evidence of dehydration (normal BUN, normal urinary specific gravity,  stable hemoglobin), and the normal liver chemistries and lipase are  reassuring, especially in the context of a prior cholecystectomy.  His x-  rays do not show free air or evident bowel obstruction.   OVERALL IMPRESSION:  I think that this is basically a continuation of  the patient's chronic and recurrent tendency for somatic complaints  without objective manifestations.  Some of these complaints include GI  symptoms which are not objectively validated, as described above.  For  example, the nausea and vomiting are not associated with evidence of  dehydration by labs, nor has he had vomiting since coming to the  emergency room hours ago.  If there is a history of diarrhea, it is not  supported by the rectal exam which showed the presence of some solid  stool as well as the plain abdominal films which show stool within the  colon.  As far as the pain goes, there is no evident acute pain nor   other objective abnormalities to account for pain.   RECOMMENDATION:  I agree with the CT scan ordered by the ER physician,  but unless significant abnormalities are identified, I would approach  this person as a chronic pain patient.  Unfortunately, such patients  need consistency of followup which is one thing that had been sorely  lacking in his case, and apparently of his volition.   We have made an appointment for the patient to see Dr. Ewing Schlein in 2 days  for office followup (Thursday, January 02, 2008, at 12:45 p.m.).  At  that time, I think the main  focus should be on clinical expectations  rather than trying to  solve the patient's multiple somatic complaints.  It might be  necessary to develop a contract with this patient that, unless he  develops an ongoing consistent primary care physician relationship  and/or is followed at a pain clinic, to reduce polypharmacy and  inconsistency of followup, that he would not remain a patient in our  practice.           ______________________________  Bernette Redbird, M.D.     RB/MEDQ  D:  12/31/2007  T:  01/01/2008  Job:  [id number]

## 2010-08-02 NOTE — Consult Note (Signed)
Dustin Hansen, Dustin [name]:  [id number]   MEDICAL RECORD NO.:  [id number]          PATIENT TYPE:  EMS   LOCATION:  MINO                         FACILITY:  [facility]   PHYSICIAN:  Gabrielle Dare. Janee Morn, M.D.DATE OF BIRTH:  1970-07-18   DATE OF CONSULTATION:  01/26/2007  DATE OF DISCHARGE:  01/26/2007                                 CONSULTATION   CHIEF COMPLAINT:  Pain in epigastric incision and no bowel movement x3  days.   HISTORY OF PRESENT ILLNESS:  The patient is a 40 year old Asian male who  underwent laparoscopic cholecystectomy with intraoperative cholangiogram  by Dr. Luretha Murphy on January 18, 2007.  He has been doing okay at  home.  He has been eating soups and noodles and passing a lot of gas.  He has had no bowel movement for the past 3 days.  Over the past 24  hours, he developed increased pain at his epigastric incision.  He did  do a lot of yard work yesterday with raking and he came in for further  evaluation in the emergency department.  Since he received pain  medication, he claims to feel much better.   PHYSICAL EXAMINATION:  GENERAL:  He is awake and alert.  LUNGS:  Clear to auscultation.  HEART:  Regular rhythm, no murmurs.  ABDOMEN:  Soft and nontender.  His umbilical incision has some evolving  ecchymosis but no significant tenderness.  Both lateral incisions are  healing without tenderness.  Epigastric incision is also intact with  Steri-Strips.  There are no palpable hernias or localized mass and  certainly no generalized tenderness either.  Bowel sounds are active.   Abdominal films today show a few dilated small bowel loops centrally  with some air-fluid levels possibly suggestive of very mild ileus.   White blood cell count 8.5, hemoglobin 15.9.  basic metabolic profile is  within normal limits.  AST 55.  ALT 82.  Alkaline phosphatase 46.  Bilirubin 0.8.  Lipase 29, amylase 64.   IMPRESSION:  Epigastric pain and no bowel movements for  2 to 3 days  status post laparoscopic cholecystectomy.  He likely has a mild ileus.  Liver function tests are not significantly elevated and white blood cell  count is normal.   I discussed things in detail with the patient.  While I do not feel a  hernia on exam, I would recommend CT scan of the abdomen and pelvis to  rule out some early bowel obstruction but he declines at this time.  He  claims that he feels fine since he received pain medication in the  emergency department and he wants to leave.  He agrees to call us if the  pain or any problems return.  I gave him a refill on his Vicodin.  I  recommended milk of magnesia and again he has had absolutely no nausea  or vomiting.  We will plan to have him follow up with Dr. Daphine Deutscher at our  office next week and I discussed this with Dr. Devoria Albe.     Gabrielle Dare Janee Morn,  M.D.  Electronically Signed    BET/MEDQ  D:  01/26/2007  T:  01/26/2007  Job:  [id number]

## 2010-08-02 NOTE — H&P (Signed)
NAMETORRE, SCHAUMBURG NO.:  [id number]   MEDICAL RECORD NO.:  [id number]          PATIENT TYPE:  EMS   LOCATION:  MINO                         FACILITY:  [facility]   PHYSICIAN:  Dustin Hansen, Dustin Hansen  DATE OF BIRTH:  Aug 10, 1970   DATE OF ADMISSION:  01/18/2007  DATE OF DISCHARGE:                              HISTORY & PHYSICAL   CHIEF COMPLAINT:  Right upper abdominal pain.   HISTORY OF PRESENT ILLNESS:  Dustin Hansen is a 40 year old [place] male  patient who reports 3  episodes of symptoms consistent with biliary  colic.  The first was remote, greater than a year ago.  The second was  within the past few months.  Recently, i.e., last night, he developed  severe abdominal pain in the right upper quadrant with nausea,  difficulty eating, unable to sleep.  He presented to the ER, where he  was found to have a mild leukocytosis.  LFTs were normal, but symptoms  were consistent with probable acute cholecystitis and ultrasound was  ordered that demonstrated stones and gallbladder wall changes consistent  with acute cholecystitis.  Surgical consultation was requested.   REVIEW OF SYSTEMS:  As above.  Some of this is limited because the  patient does speak limited English but no other significant problems  reported per the patient.   PAST MEDICAL HISTORY:  None.   PAST SURGICAL HISTORY:  None.   FAMILY HISTORY:  Noncontributory.   SOCIAL HISTORY:  No alcohol.  No tobacco.  He is married.  He has 2  children.  He does speak limited [place].  He has a job, I am not sure  exactly what, I was unable to get that out of him, but his boss is named  Aurelio Brash and the telephone number to reach work is [phone].  The patient's  boss has already been contacted by the nursing staff here, and they are  aware the patient is being admitted for surgical procedure.   ALLERGIES:  NKDA.   HOME MEDICATIONS:  None.   PHYSICAL EXAMINATION:  GENERAL:  A pleasant male patient complaining of  continued right upper quadrant abdominal pain despite receiving IV  narcotics in the ER.  VITAL SIGNS:  Temperature 96.9, blood pressure 159/96, pulse 62 and  regular, respirations 20.  NEURO:  Patient is alert and oriented x3 to the best I can determine  since he has the language barrier.  He is moving all extremities x4  without any obvious focal neurological deficits.  CHEST:  Bilateral lung sounds are clear to auscultation.  Respiratory  effort is nonlabored.  CARDIAC:  S1-S2.  No rubs, murmurs, thrills or gallops.  No tachycardia.  Pulses regular.  No JVD.  ABDOMEN:  Soft, nondistended.  He has bowel sounds present.  He is  tender in the right upper quadrant with guarding consistent with a  positive Murphy sign.  No surgical scars or hernias are noted.  No  obvious hepatosplenomegaly, masses or bruits are noted.  EXTREMITIES:  Symmetrical in appearance without edema, cyanosis or clubbing.   LABORATORY AND X-RAY:  White count 12,700, neutrophils 86%, hemoglobin  17, platelets 171,000.  Sodium 138, potassium 3.6, creatinine 1.11.  LFTs are normal.  Lipase is normal.   Ultrasound again demonstrates cholelithiasis with gallbladder wall  thickening and changes otherwise consistent with acute cholecystitis.  No documented ductal dilatation or any concern about a common duct  stone.   IMPRESSION:  Acute cholecystitis and cholelithiasis.   PLAN:  Admit OR today, laparoscopic cholecystectomy.  Risks and benefits  of the surgery have been explained to the patient per Dr. Daphine Hansen, also  explained to the wife.  Both verbalized understanding and wish to  proceed.  He will be n.p.o., IV fluids, empiric Cipro and IV pain  medications and nausea medications preoperatively as indicated.      Dustin L. Gwyneth Sprout Daphine Hansen, Dustin Hansen  Electronically Signed    ALE/MEDQ  D:  01/18/2007  T:  01/18/2007  Job:  [id number]

## 2010-08-02 NOTE — Op Note (Signed)
NAMEJAKSON, Dustin [name]:  [id number]   MEDICAL RECORD NO.:  [id number]          PATIENT TYPE:  INP   LOCATION:  5713                         FACILITY:  [facility]   PHYSICIAN:  Thornton Park. Daphine Deutscher, MD  DATE OF BIRTH:  Feb 11, 1971   DATE OF PROCEDURE:  01/18/2007  DATE OF DISCHARGE:  01/19/2007                               OPERATIVE REPORT   PREOPERATIVE DIAGNOSIS:  Acute cholecystitis.   POSTOPERATIVE DIAGNOSIS:  Acute cholecystitis with suppuration.   PROCEDURE:  Laparoscopic cholecystectomy with intraoperative  cholangiogram which revealed a dilated common bile duct, no filling  defects, with good intrahepatic filling and with tapering of the distal  common duct and ________ flow into the duodenum.   SURGEON:  Thornton Park. Daphine Deutscher, M.D.   ASSISTANT:  None.   ANESTHESIA:  General endotracheal.   DESCRIPTION OF PROCEDURE:  This 40 year old [place] male was taken to  room 17 on Friday, __________ 2008 and given general anesthesia.  The  abdomen was prepped with Techni-Care and draped sterilely.  Preoperatively he received Cipro.  The abdomen was entered through the  umbilicus with a longitudinal incision and placing Hassan cannula  without difficulty.  The patient had somewhat of an obese omental cake  but a markedly inflamed and distended gallbladder was readily visible  and appeared very edematous.  Standard trocar placements were made and  the gallbladder was grasped with some difficulty, but was finally  grasped and his fatty liver was elevated.  I had to use a 30-degrees  scope to get down and look at the distal infundibulum and the cystic  duct.  When this was done, I incised the fat and shrouded Calot's  triangle and freed that up and dissected that, put a clip upon the  gallbladder, and incised the cystic duct.  Nothing came back in terms of  particulate matter when I milked that but I then inserted the Reddick  catheter and did a dynamic cholangiogram  which showed free flow into a  dilated common bile duct with marked tapering, which I would not go so  far as to say was a stricture of the distal common bile duct, but it did  flow into the duodenum.  Intrahepatic dilatation was noted and was  slight.  Cystic duct was then triple clipped, divided, and cystic  arteries were double clipped and divided.  Then the gallbladder was  removed from the gallbladder bed without entering it.  It was placed  into a bag and was brought out to the umbilicus where it came out with a  little bit of difficulty because it was so edematous.  I went in and  irrigated the gallbladder bed.  No bleeding or bile leaks were noted.  I  had used the cautery judiciously and coming up the back wall and  controlled all the oozing at that point.  Umbilical defect was then  repaired with 2 sutures of 0 Vicryl under laparoscopic vision, and  then the wounds were closed with 4-0 Vicryl, except the umbilical wound  I did pack since the suppurative material  made this more of a class III  to class IV wound.  The patient was taken to the recovery room in  satisfactory condition.      Thornton Park Daphine Deutscher, MD  Electronically Signed     MBM/MEDQ  D:  01/18/2007  T:  01/19/2007  Job:  [id number]

## 2010-08-05 NOTE — Discharge Summary (Signed)
NAMEBOGDAN, [name]:  [id number]   MEDICAL RECORD NO.:  [id number]          PATIENT TYPE:  INP   LOCATION:  5713                         FACILITY:  [facility]   PHYSICIAN:  Leonie Man, M.D.   DATE OF BIRTH:  1970-12-06   DATE OF ADMISSION:  01/18/2007  DATE OF DISCHARGE:  01/19/2007                               DISCHARGE SUMMARY   REASON FOR ADMISSION:  [name] is a 40 year old, male patient who  presented to the ER with biliary colic.  He has had several episodes x3  over the past year, the worst has been this time.  He has had severe,  unrelenting abdominal pain in the right upper quadrant since the night  before.  In the ER he was found to have mild leukocytosis with normal  LFTs and abdominal ultrasound demonstrated cholelithiasis and acute  cholecystitis.  On exam, he was very tender in the right upper quadrant  with guarding and Murphy's sign.  White count was 12,700.  The patient  was admitted with a diagnosis of acute cholelithiasis and cholecystitis.   HOSPITAL COURSE:  The patient was taken from the ER to the OR to undergo  a laparoscopic cholecystectomy.  Preoperatively, he received empiric  Cipro IV as well as IV pain medications.  The patient underwent a  laparoscopic cholecystectomy with an intraoperative cholangiogram that  revealed a dilated common bile duct with tapered distal common bile  duct, but no evidence of retained stone.  Otherwise, tolerated procedure  well and was sent back to the general floor.   On postop day #1, the patient was stable.  He was having urinary  retention initially.  Foley catheter was removed and the patient was  able to void that same day.  He was also tolerating a diet and oral pain  medications and therefore, was deemed appropriate for discharge home.  His incisions were unremarkable.   DISCHARGE DIAGNOSES:  1. Cholelithiasis with acute cholecystitis, status post laparoscopic      cholecystectomy.  2.  Mild postoperative urinary retention, resolved.   DISCHARGE MEDICATIONS:  Vicodin one to two every 4 hours as needed for  pain.   WOUND CARE:  Allow any Steri-Strips to fall off.   ACTIVITY:  May shower.  No lifting more than 10 pounds for the next 2  weeks.  Return to work in 2 weeks.  No restrictions in diet.   FOLLOW UP:  She needs to see Dr. Daphine Deutscher in 2-3 weeks.  Please call for  appointment.   SPECIAL INSTRUCTIONS:  The patient has also been given the [facility] home care laparoscopic cholecystectomy instructions  including details about when to call the surgeon for problems such as  fever, potential wound infections or unresolved pain.      Allison L. Rennis Harding, N.P.      Leonie Man, M.D.  Electronically Signed    ALE/MEDQ  D:  02/22/2007  T:  02/23/2007  Job:  [id number]

## 2010-08-07 ENCOUNTER — Emergency Department (HOSPITAL_COMMUNITY)
Admission: EM | Admit: 2010-08-07 | Discharge: 2010-08-08 | Disposition: A | Discharge status: Home / Self Care | Payer: Medicaid Other | Attending: Emergency Medicine | Admitting: Emergency Medicine

## 2010-08-07 DIAGNOSIS — M79609 Pain in unspecified limb: Secondary | ICD-10-CM | POA: Insufficient documentation

## 2010-08-07 DIAGNOSIS — E119 Type 2 diabetes mellitus without complications: Secondary | ICD-10-CM | POA: Insufficient documentation

## 2010-08-07 DIAGNOSIS — M109 Gout, unspecified: Secondary | ICD-10-CM | POA: Insufficient documentation

## 2010-08-07 DIAGNOSIS — I10 Essential (primary) hypertension: Secondary | ICD-10-CM | POA: Insufficient documentation

## 2010-08-07 LAB — CBC
MCH: 29 pg (ref 26.0–34.0)
MCHC: 35.4 g/dL (ref 30.0–36.0)
Platelets: 176 10*3/uL (ref 150–400)

## 2010-08-07 LAB — DIFFERENTIAL
Basophils Relative: 0 % (ref 0–1)
Eosinophils Absolute: 0.2 10*3/uL (ref 0.0–0.7)
Monocytes Absolute: 0.9 10*3/uL (ref 0.1–1.0)
Monocytes Relative: 6 % (ref 3–12)
Neutrophils Relative %: 80 % — ABNORMAL HIGH (ref 43–77)

## 2010-08-07 LAB — POCT I-STAT, CHEM 8
Calcium, Ion: 1.1 mmol/L — ABNORMAL LOW (ref 1.12–1.32)
HCT: 50 % (ref 39.0–52.0)
TCO2: 27 mmol/L (ref 0–100)

## 2010-08-07 LAB — URIC ACID: Uric Acid, Serum: 10.2 mg/dL — ABNORMAL HIGH (ref 4.0–7.8)

## 2010-08-17 ENCOUNTER — Ambulatory Visit (HOSPITAL_COMMUNITY): Payer: Medicaid Other | Admitting: Physician Assistant

## 2010-09-05 ENCOUNTER — Ambulatory Visit (HOSPITAL_COMMUNITY): Payer: Medicaid Other | Admitting: Psychiatry

## 2010-09-21 ENCOUNTER — Emergency Department (HOSPITAL_COMMUNITY): Payer: Medicaid Other

## 2010-09-21 ENCOUNTER — Emergency Department (HOSPITAL_COMMUNITY)
Admission: EM | Admit: 2010-09-21 | Discharge: 2010-09-21 | Disposition: A | Discharge status: Home / Self Care | Payer: Medicaid Other | Source: Home / Self Care | Attending: Emergency Medicine | Admitting: Emergency Medicine

## 2010-09-21 DIAGNOSIS — M109 Gout, unspecified: Secondary | ICD-10-CM | POA: Insufficient documentation

## 2010-09-21 DIAGNOSIS — R0602 Shortness of breath: Secondary | ICD-10-CM | POA: Insufficient documentation

## 2010-09-21 DIAGNOSIS — E119 Type 2 diabetes mellitus without complications: Secondary | ICD-10-CM | POA: Insufficient documentation

## 2010-09-21 DIAGNOSIS — M79609 Pain in unspecified limb: Secondary | ICD-10-CM | POA: Insufficient documentation

## 2010-09-21 DIAGNOSIS — I10 Essential (primary) hypertension: Secondary | ICD-10-CM | POA: Insufficient documentation

## 2010-09-21 DIAGNOSIS — R05 Cough: Secondary | ICD-10-CM | POA: Insufficient documentation

## 2010-09-21 DIAGNOSIS — R059 Cough, unspecified: Secondary | ICD-10-CM | POA: Insufficient documentation

## 2010-09-21 DIAGNOSIS — R404 Transient alteration of awareness: Secondary | ICD-10-CM | POA: Insufficient documentation

## 2010-09-21 DIAGNOSIS — R51 Headache: Secondary | ICD-10-CM | POA: Insufficient documentation

## 2010-09-21 LAB — POCT I-STAT, CHEM 8
Chloride: 104 mEq/L (ref 96–112)
Creatinine, Ser: 0.9 mg/dL (ref 0.50–1.35)
Glucose, Bld: 110 mg/dL — ABNORMAL HIGH (ref 70–99)
Potassium: 4.1 mEq/L (ref 3.5–5.1)

## 2010-09-21 MED ORDER — GADOBENATE DIMEGLUMINE 529 MG/ML IV SOLN
18.0000 mL | Freq: Once | INTRAVENOUS | Status: AC | PRN
  Administered 2010-09-21: 18 mL via INTRAVENOUS

## 2010-09-22 ENCOUNTER — Emergency Department (HOSPITAL_COMMUNITY)
Admission: EM | Admit: 2010-09-22 | Discharge: 2010-09-22 | Disposition: A | Discharge status: Home / Self Care | Payer: Medicaid Other | Source: Home / Self Care | Attending: Emergency Medicine | Admitting: Emergency Medicine

## 2010-09-22 DIAGNOSIS — R51 Headache: Secondary | ICD-10-CM | POA: Insufficient documentation

## 2010-09-22 DIAGNOSIS — I10 Essential (primary) hypertension: Secondary | ICD-10-CM | POA: Insufficient documentation

## 2010-09-23 ENCOUNTER — Emergency Department (HOSPITAL_COMMUNITY)
Admission: EM | Admit: 2010-09-23 | Discharge: 2010-09-23 | Disposition: A | Discharge status: Home / Self Care | Payer: Medicaid Other | Source: Home / Self Care | Attending: Emergency Medicine | Admitting: Emergency Medicine

## 2010-09-23 DIAGNOSIS — I10 Essential (primary) hypertension: Secondary | ICD-10-CM | POA: Insufficient documentation

## 2010-09-23 DIAGNOSIS — G4700 Insomnia, unspecified: Secondary | ICD-10-CM | POA: Insufficient documentation

## 2010-09-23 DIAGNOSIS — Z79899 Other long term (current) drug therapy: Secondary | ICD-10-CM | POA: Insufficient documentation

## 2010-09-23 DIAGNOSIS — R51 Headache: Secondary | ICD-10-CM | POA: Insufficient documentation

## 2010-09-24 ENCOUNTER — Emergency Department (HOSPITAL_COMMUNITY)
Admission: EM | Admit: 2010-09-24 | Discharge: 2010-09-24 | Disposition: A | Discharge status: Other Acute Inpatient Hospital | Payer: Medicaid Other | Source: Home / Self Care | Attending: Emergency Medicine | Admitting: Emergency Medicine

## 2010-09-24 ENCOUNTER — Inpatient Hospital Stay (HOSPITAL_COMMUNITY)
Admission: RE | Admit: 2010-09-24 | Discharge: 2010-09-27 | DRG: 885 | Disposition: A | Discharge status: Home / Self Care | Payer: Medicaid Other | Source: Ambulatory Visit | Attending: Psychiatry | Admitting: Psychiatry

## 2010-09-24 DIAGNOSIS — Z560 Unemployment, unspecified: Secondary | ICD-10-CM

## 2010-09-24 DIAGNOSIS — G8929 Other chronic pain: Secondary | ICD-10-CM

## 2010-09-24 DIAGNOSIS — R45851 Suicidal ideations: Secondary | ICD-10-CM

## 2010-09-24 DIAGNOSIS — R51 Headache: Secondary | ICD-10-CM | POA: Insufficient documentation

## 2010-09-24 DIAGNOSIS — M549 Dorsalgia, unspecified: Secondary | ICD-10-CM

## 2010-09-24 DIAGNOSIS — Z85028 Personal history of other malignant neoplasm of stomach: Secondary | ICD-10-CM

## 2010-09-24 DIAGNOSIS — F29 Unspecified psychosis not due to a substance or known physiological condition: Secondary | ICD-10-CM | POA: Insufficient documentation

## 2010-09-24 DIAGNOSIS — Z79899 Other long term (current) drug therapy: Secondary | ICD-10-CM | POA: Insufficient documentation

## 2010-09-24 DIAGNOSIS — E119 Type 2 diabetes mellitus without complications: Secondary | ICD-10-CM

## 2010-09-24 DIAGNOSIS — I10 Essential (primary) hypertension: Secondary | ICD-10-CM | POA: Insufficient documentation

## 2010-09-24 DIAGNOSIS — M109 Gout, unspecified: Secondary | ICD-10-CM

## 2010-09-24 DIAGNOSIS — F1311 Sedative, hypnotic or anxiolytic abuse, in remission: Secondary | ICD-10-CM

## 2010-09-24 DIAGNOSIS — F1011 Alcohol abuse, in remission: Secondary | ICD-10-CM

## 2010-09-24 DIAGNOSIS — M25559 Pain in unspecified hip: Secondary | ICD-10-CM

## 2010-09-24 LAB — BASIC METABOLIC PANEL
CO2: 24 mEq/L (ref 19–32)
Calcium: 9.5 mg/dL (ref 8.4–10.5)
Chloride: 104 mEq/L (ref 96–112)
Glucose, Bld: 101 mg/dL — ABNORMAL HIGH (ref 70–99)
Sodium: 138 mEq/L (ref 135–145)

## 2010-09-24 LAB — DIFFERENTIAL
Basophils Relative: 0 % (ref 0–1)
Lymphs Abs: 2.4 10*3/uL (ref 0.7–4.0)
Monocytes Relative: 5 % (ref 3–12)
Neutro Abs: 6.2 10*3/uL (ref 1.7–7.7)
Neutrophils Relative %: 67 % (ref 43–77)

## 2010-09-24 LAB — CBC
Hemoglobin: 18 g/dL — ABNORMAL HIGH (ref 13.0–17.0)
MCH: 29.3 pg (ref 26.0–34.0)
MCV: 80 fL (ref 78.0–100.0)
RBC: 6.15 MIL/uL — ABNORMAL HIGH (ref 4.22–5.81)

## 2010-09-24 LAB — RAPID URINE DRUG SCREEN, HOSP PERFORMED
Amphetamines: NOT DETECTED
Opiates: NOT DETECTED
Tetrahydrocannabinol: NOT DETECTED

## 2010-09-24 LAB — CK TOTAL AND CKMB (NOT AT ARMC): CK, MB: 1.6 ng/mL (ref 0.3–4.0)

## 2010-09-25 DIAGNOSIS — G4700 Insomnia, unspecified: Secondary | ICD-10-CM

## 2010-09-25 LAB — GLUCOSE, CAPILLARY
Glucose-Capillary: 156 mg/dL — ABNORMAL HIGH (ref 70–99)
Glucose-Capillary: 184 mg/dL — ABNORMAL HIGH (ref 70–99)

## 2010-09-26 LAB — GLUCOSE, CAPILLARY
Glucose-Capillary: 131 mg/dL — ABNORMAL HIGH (ref 70–99)
Glucose-Capillary: 138 mg/dL — ABNORMAL HIGH (ref 70–99)
Glucose-Capillary: 118 mg/dL — ABNORMAL HIGH (ref 70–99)

## 2010-09-29 NOTE — Discharge Summary (Signed)
  NAMEBENSYN, BORNEMANN NO.:  [id number]  MEDICAL RECORD NO.:  [id number]  LOCATION:  0402                          FACILITY:  [facility]  PHYSICIAN:  Eulogio Ditch, MD DATE OF BIRTH:  [birth year]  DATE OF ADMISSION:  09/24/2010 DATE OF DISCHARGE:  09/27/2010                              DISCHARGE SUMMARY   HISTORY OF PRESENT ILLNESS:  This is a 40 year old patient, male, who was at [facility] ER, he was confused and verbalized some suicidal ideation but throughout the stay in the hospital, the patient denied any suicidal ideation.  He told us that he was misunderstood.  He did not know why he was admitted in the hospital.  He denied hearing any voices.  The patient was discharged from [facility] on __________ , and he was on Risperdal.  We started the patient back on the Risperdal, and he responded well to the medication without any side effects.  The patient reported that after discharge, he was not drinking alcohol, beer or anything else.  He denied abuse of any drugs.  We contacted the patient's wife, and she had no safety concerns for the patient's discharge.  Throughout the stay in the hospital, the patient participated in the groups.  His behavior remained under control.  No agitation was reported by staff.  MENTAL STATUS AT THE TIME OF DISCHARGE:  The patient was seen by the RN, along with the interpreter.  The patient is very logical and goal- directed.  He denies hearing voices.  He is not delusional, not internally preoccupied.  Mood:  Euthymic.  Affect:  Mood-congruent.  Not suicidal or homicidal.  Alert, awake, oriented x3.  Memory:  Immediate, recent, remote fair.  Attention and concentration:  Fair.  Abstraction ability:  Fair.  Insight and judgment intact.  DIAGNOSES AT THE TIME OF DISCHARGE:  Axis I:  Psychosis not otherwise specified, history of alcohol abuse and benzodiazepine abuse under remission. Axis II:  Deferred. Axis  III:   History of diabetes, history of gout. Axis IV:  No active psychosocial stressors. Axis V:  55.  DISCHARGE MEDICATIONS:  The patient discharged on: 1. Risperdal 2 mg at bedtime. 2. Cogentin 1 mg b.i.d.. 3. Colchicine 0.6 mg b.i.d. 4. Hydrochlorothiazide 25 mg p.o. daily. 5. Norvasc 10 mg p.o. daily. 6. Potassium chloride 10 mEq p.o. daily.  Side effects, risks and benefits of the medication were discussed with the patient.  The patient was advised to remain compliant with the medications.  DISCHARGE FOLLOWUP:  The patient will follow up at [facility], phone number [phone].     Eulogio Ditch, MD     SA/MEDQ  D:  09/27/2010  T:  09/27/2010  Job:  [id number]  Electronically Signed by Eulogio Ditch  on 09/29/2010 03:31:33 PM

## 2010-10-05 ENCOUNTER — Ambulatory Visit (HOSPITAL_COMMUNITY): Payer: Medicaid Other | Admitting: Psychiatry

## 2010-10-05 DIAGNOSIS — F29 Unspecified psychosis not due to a substance or known physiological condition: Secondary | ICD-10-CM
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
## 2010-10-07 ENCOUNTER — Emergency Department (HOSPITAL_COMMUNITY)
Admission: EM | Admit: 2010-10-07 | Discharge: 2010-10-07 | Disposition: A | Discharge status: Home / Self Care | Payer: Medicaid Other | Source: Home / Self Care | Attending: Emergency Medicine | Admitting: Emergency Medicine

## 2010-10-07 ENCOUNTER — Emergency Department (HOSPITAL_COMMUNITY)
Admission: EM | Admit: 2010-10-07 | Discharge: 2010-10-07 | Disposition: A | Discharge status: Home / Self Care | Payer: Medicaid Other | Attending: Emergency Medicine | Admitting: Emergency Medicine

## 2010-10-07 DIAGNOSIS — R209 Unspecified disturbances of skin sensation: Secondary | ICD-10-CM | POA: Insufficient documentation

## 2010-10-07 DIAGNOSIS — Z85028 Personal history of other malignant neoplasm of stomach: Secondary | ICD-10-CM | POA: Insufficient documentation

## 2010-10-07 DIAGNOSIS — Z862 Personal history of diseases of the blood and blood-forming organs and certain disorders involving the immune mechanism: Secondary | ICD-10-CM | POA: Insufficient documentation

## 2010-10-07 DIAGNOSIS — I10 Essential (primary) hypertension: Secondary | ICD-10-CM | POA: Insufficient documentation

## 2010-10-07 DIAGNOSIS — Z79899 Other long term (current) drug therapy: Secondary | ICD-10-CM | POA: Insufficient documentation

## 2010-10-07 DIAGNOSIS — M549 Dorsalgia, unspecified: Secondary | ICD-10-CM | POA: Insufficient documentation

## 2010-10-07 DIAGNOSIS — G4700 Insomnia, unspecified: Secondary | ICD-10-CM | POA: Insufficient documentation

## 2010-10-07 DIAGNOSIS — Z8639 Personal history of other endocrine, nutritional and metabolic disease: Secondary | ICD-10-CM | POA: Insufficient documentation

## 2010-10-07 DIAGNOSIS — M25519 Pain in unspecified shoulder: Secondary | ICD-10-CM | POA: Insufficient documentation

## 2010-10-11 ENCOUNTER — Emergency Department (HOSPITAL_COMMUNITY): Payer: Medicaid Other

## 2010-10-11 ENCOUNTER — Emergency Department (HOSPITAL_COMMUNITY)
Admission: EM | Admit: 2010-10-11 | Discharge: 2010-10-11 | Disposition: A | Discharge status: Home / Self Care | Payer: Medicaid Other | Attending: Emergency Medicine | Admitting: Emergency Medicine

## 2010-10-11 DIAGNOSIS — S93609A Unspecified sprain of unspecified foot, initial encounter: Secondary | ICD-10-CM | POA: Insufficient documentation

## 2010-10-11 DIAGNOSIS — W108XXA Fall (on) (from) other stairs and steps, initial encounter: Secondary | ICD-10-CM | POA: Insufficient documentation

## 2010-10-11 NOTE — Assessment & Plan Note (Signed)
Dustin Hansen, Dustin [name]:  [id number]  MEDICAL RECORD NO.:  [id number]  LOCATION:  0402                          FACILITY:  BH  PHYSICIAN:  Vic Ripper, P.A.-C.DATE OF BIRTH:  [birth year]  DATE OF ADMISSION:  09/24/2010 DATE OF DISCHARGE:                      PSYCHIATRIC ADMISSION ASSESSMENT   This is a voluntary admission to the services of Dr. Rogers Blocker.  This is a 40 year old single __________.  He presented to the emergency department  at [facility] and initially when he came in  he reported that he was not able to sleep,  that he had  already been seen for this. He could not recall what was advised and he also was  complaining about chronic right hip and back pain.  His UDS was negative for substances.  He had no measurable alcohol.  His electrolytes had no worrisome findings and his vital signs were stable. His temperature was 97.8 to 98, pulse was 70 to 98, respirations 16 to 18 and his blood pressure ranged from 135/92 to 148/115.  Dustin Hansen was with [place] 02/15 to 02/23. At that time  he was found to have had psychosis,  NOS and a history for alcohol and benzodiazepine abuse. He was discharged on Risperdal.  On his listed medications from [facility] he does not show the Risperdal and he was given some Risperdal over in the emergency room. He states that he slept last night and he is already requesting discharge.  When he was in the emergency room they pointedly asked him  where do you sleep at night.  He said in a house. They asked why are you not sleeping at night.  He said bad thoughts, ugly thoughts.  Are there thoughts like voices.  He said bad thoughts and he was asked, "Do you feel like they are making you want to hurt yourself or someone else?"  He said yes.  Do they tell you things.  Do they  tell you things to do to yourself or others and he said bad thoughts past years.  He looked fearful. He was trying to cover his head while  stating anything about the bad thoughts. Explained to the patient that a sleeping pill would not make the thoughts go away.  He needs to help him speak to someone who can help him deal with the thoughts and works through them.  The patient agreed to this.  As already stated,  he was given Risperdal 1 mg p.o. in the emergency room.  PAST MEDICAL HISTORY:  As already stated he was with [place] 02/15 to 02/23. He was also here back in August 2011.  SOCIAL HISTORY:  He is unmarried.  He does have 63 year old daughter. He is unemployed and he is currently applying for disability with a Clinical research associate.  FAMILY HISTORY:  As far as we know no one else has mental illness.  ALCOHOL AND DRUG HISTORY:  He does have a history for alcohol abuse.  PRIMARY CARE PHYSICIAN:  He tells me a Dr. Howard Pouch on  [address]  MEDICAL PROBLEMS:  He has recently had gout.  He is on colchicine.  He is known to have diabetes  mellitus type 2 and he is status post a cholecystectomy and a history for alcohol abuse.  MEDICATIONS:  At the time of discharge he was discharged back in February on benztropine 1 mg p.o. b.i.d., Benadryl 50 mg at bedtime, one multivitamin a day, Risperdal 1 mg p.o. in the morning and bedtime, amlodipine 10 mg p.o. daily, colchicine 0.6 mg p.o. b.i.d., hydrochlorothiazide 25 mg p.o. daily and potassium chloride 10 mEq daily. He is currently getting his medications at [facility].  He did not appear to be compliant with the Risperdal. The pharmacy tech will reconcile and verify medication refills when they open at 9:00 a.m.  POSITIVE PHYSICAL FINDINGS:  Again he was medically cleared in the ED at [facility].  He did not have any acute abnormal findings.  MENTAL STATUS EXAM:  He is alert and oriented.  He is able to converse and apparently understands what is being said to him.  He is casually groomed and dressed in hospital scrubs.  His speech is a little halting as English is not his native  language. His mood:  he is calm.  He reports that he slept well.  He appears that way.  Thought processes are clear, rational and goal oriented.  He is requesting discharge. Judgment and insight are fair.  Concentration and memory superficially are intact.  Intelligence is average.  He denies being suicidal or homicidal.  He does report visual hallucinations.  He is denying hearing voices right now and he is denying any ugly E thoughts right now.  IMPRESSION:  Axis I:  Psychosis NOS, history for alcohol abuse, history for benzodiazepine abuse. Currently no evidence for this. AXIS II:  No diagnosis. AXIS III:  Diabetes mellitus type 2 diet controlled and apparently history for gout. AXIS IV:  Financial stressors.  He is unemployed,  does not get any income. AXIS V:  45.  PLAN:  The plan is to admit for safety and stabilization to restart the Risperdal.  Estimated length of stay is two to three days.     Vic Ripper, P.A.-C.     MD/MEDQ  D:  09/25/2010  T:  09/25/2010  Job:  [id number]  Electronically Signed by Jaci Lazier ADAMS P.A.-C. on 10/06/2010 06:40:53 PM Electronically Signed by Eulogio Ditch  on 10/11/2010 09:37:39 AM

## 2010-10-16 ENCOUNTER — Emergency Department (HOSPITAL_COMMUNITY)
Admission: EM | Admit: 2010-10-16 | Discharge: 2010-10-17 | Disposition: A | Discharge status: Home / Self Care | Payer: Medicaid Other | Attending: Emergency Medicine | Admitting: Emergency Medicine

## 2010-10-16 DIAGNOSIS — G4700 Insomnia, unspecified: Secondary | ICD-10-CM | POA: Insufficient documentation

## 2010-10-16 DIAGNOSIS — R209 Unspecified disturbances of skin sensation: Secondary | ICD-10-CM | POA: Insufficient documentation

## 2010-10-17 ENCOUNTER — Emergency Department (HOSPITAL_COMMUNITY): Payer: Medicaid Other

## 2010-10-17 LAB — POCT I-STAT, CHEM 8
BUN: 7 mg/dL (ref 6–23)
Calcium, Ion: 1.15 mmol/L (ref 1.12–1.32)
Chloride: 104 mEq/L (ref 96–112)
Creatinine, Ser: 1 mg/dL (ref 0.50–1.35)
Sodium: 140 mEq/L (ref 135–145)
TCO2: 26 mmol/L (ref 0–100)

## 2010-10-26 ENCOUNTER — Emergency Department (HOSPITAL_COMMUNITY)
Admission: EM | Admit: 2010-10-26 | Discharge: 2010-10-26 | Disposition: A | Discharge status: Home / Self Care | Payer: Medicaid Other | Source: Home / Self Care | Attending: Emergency Medicine | Admitting: Emergency Medicine

## 2010-10-26 ENCOUNTER — Emergency Department (HOSPITAL_COMMUNITY)
Admission: EM | Admit: 2010-10-26 | Discharge: 2010-10-26 | Disposition: A | Discharge status: Home / Self Care | Payer: Medicaid Other | Attending: Emergency Medicine | Admitting: Emergency Medicine

## 2010-10-26 DIAGNOSIS — Z862 Personal history of diseases of the blood and blood-forming organs and certain disorders involving the immune mechanism: Secondary | ICD-10-CM | POA: Insufficient documentation

## 2010-10-26 DIAGNOSIS — M79609 Pain in unspecified limb: Secondary | ICD-10-CM | POA: Insufficient documentation

## 2010-10-26 DIAGNOSIS — Z8639 Personal history of other endocrine, nutritional and metabolic disease: Secondary | ICD-10-CM | POA: Insufficient documentation

## 2010-10-26 DIAGNOSIS — G8929 Other chronic pain: Secondary | ICD-10-CM | POA: Insufficient documentation

## 2010-10-26 DIAGNOSIS — M545 Low back pain, unspecified: Secondary | ICD-10-CM | POA: Insufficient documentation

## 2010-10-26 DIAGNOSIS — R51 Headache: Secondary | ICD-10-CM | POA: Insufficient documentation

## 2010-10-26 DIAGNOSIS — M542 Cervicalgia: Secondary | ICD-10-CM | POA: Insufficient documentation

## 2010-10-26 DIAGNOSIS — E119 Type 2 diabetes mellitus without complications: Secondary | ICD-10-CM | POA: Insufficient documentation

## 2010-10-26 DIAGNOSIS — M546 Pain in thoracic spine: Secondary | ICD-10-CM | POA: Insufficient documentation

## 2010-10-26 LAB — GLUCOSE, CAPILLARY

## 2010-10-28 ENCOUNTER — Emergency Department (HOSPITAL_COMMUNITY)
Admission: EM | Admit: 2010-10-28 | Discharge: 2010-10-28 | Disposition: A | Discharge status: Home / Self Care | Payer: Medicaid Other | Source: Home / Self Care | Attending: Emergency Medicine | Admitting: Emergency Medicine

## 2010-10-28 ENCOUNTER — Emergency Department (HOSPITAL_COMMUNITY)
Admission: EM | Admit: 2010-10-28 | Discharge: 2010-10-28 | Disposition: A | Discharge status: Home / Self Care | Payer: Medicaid Other | Attending: Emergency Medicine | Admitting: Emergency Medicine

## 2010-10-28 ENCOUNTER — Emergency Department (HOSPITAL_COMMUNITY): Payer: Medicaid Other

## 2010-10-28 DIAGNOSIS — R000 Tachycardia, unspecified: Secondary | ICD-10-CM | POA: Insufficient documentation

## 2010-10-28 DIAGNOSIS — R0789 Other chest pain: Secondary | ICD-10-CM | POA: Insufficient documentation

## 2010-10-28 DIAGNOSIS — R51 Headache: Secondary | ICD-10-CM | POA: Insufficient documentation

## 2010-10-28 DIAGNOSIS — G8929 Other chronic pain: Secondary | ICD-10-CM | POA: Insufficient documentation

## 2010-10-28 DIAGNOSIS — E119 Type 2 diabetes mellitus without complications: Secondary | ICD-10-CM | POA: Insufficient documentation

## 2010-10-28 DIAGNOSIS — R5381 Other malaise: Secondary | ICD-10-CM | POA: Insufficient documentation

## 2010-10-28 DIAGNOSIS — K7689 Other specified diseases of liver: Secondary | ICD-10-CM | POA: Insufficient documentation

## 2010-10-28 DIAGNOSIS — R112 Nausea with vomiting, unspecified: Secondary | ICD-10-CM | POA: Insufficient documentation

## 2010-10-28 DIAGNOSIS — R42 Dizziness and giddiness: Secondary | ICD-10-CM | POA: Insufficient documentation

## 2010-10-28 LAB — RAPID URINE DRUG SCREEN, HOSP PERFORMED
Amphetamines: NOT DETECTED
Barbiturates: NOT DETECTED
Benzodiazepines: NOT DETECTED

## 2010-10-28 LAB — CBC
MCH: 28.7 pg (ref 26.0–34.0)
MCHC: 36.2 g/dL — ABNORMAL HIGH (ref 30.0–36.0)
MCV: 79.7 fL (ref 78.0–100.0)
MCV: 79.3 fL (ref 78.0–100.0)
Platelets: 174 10*3/uL (ref 150–400)
Platelets: 168 10*3/uL (ref 150–400)
RBC: 6.07 MIL/uL — ABNORMAL HIGH (ref 4.22–5.81)
RBC: 5.36 MIL/uL (ref 4.22–5.81)
RDW: 12.5 % (ref 11.5–15.5)
WBC: 9 10*3/uL (ref 4.0–10.5)

## 2010-10-28 LAB — COMPREHENSIVE METABOLIC PANEL
ALT: 34 U/L (ref 0–53)
AST: 25 U/L (ref 0–37)
AST: 22 U/L (ref 0–37)
Albumin: 4.4 g/dL (ref 3.5–5.2)
Albumin: 4.1 g/dL (ref 3.5–5.2)
Alkaline Phosphatase: 60 U/L (ref 39–117)
Alkaline Phosphatase: 49 U/L (ref 39–117)
BUN: 11 mg/dL (ref 6–23)
CO2: 24 mEq/L (ref 19–32)
CO2: 18 mEq/L — ABNORMAL LOW (ref 19–32)
Chloride: 99 mEq/L (ref 96–112)
Chloride: 102 mEq/L (ref 96–112)
Creatinine, Ser: 1.04 mg/dL (ref 0.50–1.35)
GFR calc non Af Amer: >60 mL/min (ref >60)
GFR calc non Af Amer: >60 mL/min (ref >60)
Potassium: 3.5 mEq/L (ref 3.5–5.1)
Potassium: 3.9 mEq/L (ref 3.5–5.1)
Sodium: 134 mEq/L — ABNORMAL LOW (ref 135–145)
Total Bilirubin: 1.4 mg/dL — ABNORMAL HIGH (ref 0.3–1.2)
Total Bilirubin: 0.8 mg/dL (ref 0.3–1.2)

## 2010-10-28 LAB — DIFFERENTIAL
Basophils Absolute: 0 10*3/uL (ref 0.0–0.1)
Basophils Relative: 0 % (ref 0–1)
Eosinophils Absolute: 0 10*3/uL (ref 0.0–0.7)
Eosinophils Absolute: 0 10*3/uL (ref 0.0–0.7)
Eosinophils Relative: 0 % (ref 0–5)
Lymphs Abs: 0.8 10*3/uL (ref 0.7–4.0)
Lymphs Abs: 0.9 10*3/uL (ref 0.7–4.0)
Monocytes Absolute: 0.1 10*3/uL (ref 0.1–1.0)
Monocytes Relative: 2 % — ABNORMAL LOW (ref 3–12)
Neutrophils Relative %: 91 % — ABNORMAL HIGH (ref 43–77)
Neutrophils Relative %: 85 % — ABNORMAL HIGH (ref 43–77)

## 2010-10-28 LAB — ACETAMINOPHEN LEVEL: Acetaminophen (Tylenol), Serum: <15 ug/mL (ref 10–30)

## 2010-10-28 LAB — CK TOTAL AND CKMB (NOT AT ARMC): Total CK: 125 U/L (ref 7–232)

## 2010-10-28 LAB — TROPONIN I: Troponin I: <0.3 ng/mL (ref <0.30)

## 2010-10-28 MED ORDER — IOHEXOL 300 MG/ML  SOLN
100.0000 mL | Freq: Once | INTRAMUSCULAR | Status: AC | PRN
  Administered 2010-10-28: 100 mL via INTRAVENOUS

## 2010-10-30 ENCOUNTER — Emergency Department (HOSPITAL_COMMUNITY)
Admission: EM | Admit: 2010-10-30 | Discharge: 2010-10-30 | Disposition: A | Discharge status: Home / Self Care | Payer: Medicaid Other | Attending: Emergency Medicine | Admitting: Emergency Medicine

## 2010-10-30 DIAGNOSIS — Z8639 Personal history of other endocrine, nutritional and metabolic disease: Secondary | ICD-10-CM | POA: Insufficient documentation

## 2010-10-30 DIAGNOSIS — E119 Type 2 diabetes mellitus without complications: Secondary | ICD-10-CM | POA: Insufficient documentation

## 2010-10-30 DIAGNOSIS — Z862 Personal history of diseases of the blood and blood-forming organs and certain disorders involving the immune mechanism: Secondary | ICD-10-CM | POA: Insufficient documentation

## 2010-10-30 DIAGNOSIS — Z79899 Other long term (current) drug therapy: Secondary | ICD-10-CM | POA: Insufficient documentation

## 2010-10-30 DIAGNOSIS — M79609 Pain in unspecified limb: Secondary | ICD-10-CM | POA: Insufficient documentation

## 2010-10-30 DIAGNOSIS — I10 Essential (primary) hypertension: Secondary | ICD-10-CM | POA: Insufficient documentation

## 2010-10-30 DIAGNOSIS — M533 Sacrococcygeal disorders, not elsewhere classified: Secondary | ICD-10-CM | POA: Insufficient documentation

## 2010-10-30 DIAGNOSIS — M545 Low back pain, unspecified: Secondary | ICD-10-CM | POA: Insufficient documentation

## 2010-10-30 DIAGNOSIS — IMO0001 Reserved for inherently not codable concepts without codable children: Secondary | ICD-10-CM | POA: Insufficient documentation

## 2010-10-31 ENCOUNTER — Emergency Department (HOSPITAL_COMMUNITY)
Admission: EM | Admit: 2010-10-31 | Discharge: 2010-10-31 | Disposition: A | Discharge status: Home / Self Care | Payer: Medicaid Other | Source: Home / Self Care | Attending: Emergency Medicine | Admitting: Emergency Medicine

## 2010-10-31 ENCOUNTER — Emergency Department (HOSPITAL_COMMUNITY)
Admission: EM | Admit: 2010-10-31 | Discharge: 2010-10-31 | Disposition: A | Discharge status: Home / Self Care | Payer: Medicaid Other | Attending: Emergency Medicine | Admitting: Emergency Medicine

## 2010-10-31 DIAGNOSIS — I10 Essential (primary) hypertension: Secondary | ICD-10-CM | POA: Insufficient documentation

## 2010-10-31 DIAGNOSIS — R112 Nausea with vomiting, unspecified: Secondary | ICD-10-CM | POA: Insufficient documentation

## 2010-10-31 DIAGNOSIS — E119 Type 2 diabetes mellitus without complications: Secondary | ICD-10-CM | POA: Insufficient documentation

## 2010-10-31 DIAGNOSIS — M549 Dorsalgia, unspecified: Secondary | ICD-10-CM | POA: Insufficient documentation

## 2010-10-31 DIAGNOSIS — M25559 Pain in unspecified hip: Secondary | ICD-10-CM | POA: Insufficient documentation

## 2010-10-31 LAB — URINALYSIS, ROUTINE W REFLEX MICROSCOPIC
Ketones, ur: NEGATIVE mg/dL
Leukocytes, UA: NEGATIVE
Nitrite: NEGATIVE
Protein, ur: NEGATIVE mg/dL
Urobilinogen, UA: 1 mg/dL (ref 0.0–1.0)

## 2010-11-01 ENCOUNTER — Emergency Department (HOSPITAL_COMMUNITY)
Admission: EM | Admit: 2010-11-01 | Discharge: 2010-11-01 | Disposition: A | Discharge status: Home / Self Care | Payer: Medicaid Other | Source: Home / Self Care | Attending: Emergency Medicine | Admitting: Emergency Medicine

## 2010-11-01 DIAGNOSIS — M549 Dorsalgia, unspecified: Secondary | ICD-10-CM | POA: Insufficient documentation

## 2010-11-01 LAB — URINE CULTURE
Colony Count: NO GROWTH
Culture: NO GROWTH

## 2010-11-04 ENCOUNTER — Encounter: Payer: Self-pay | Admitting: Internal Medicine

## 2010-11-04 ENCOUNTER — Emergency Department (HOSPITAL_COMMUNITY): Payer: Medicaid Other

## 2010-11-04 ENCOUNTER — Inpatient Hospital Stay (HOSPITAL_COMMUNITY)
Admission: EM | Admit: 2010-11-04 | Discharge: 2010-11-08 | DRG: 917 | Discharge status: Against Medical Advice | Payer: Medicaid Other | Attending: Internal Medicine | Admitting: Internal Medicine

## 2010-11-04 DIAGNOSIS — T398X2A Poisoning by other nonopioid analgesics and antipyretics, not elsewhere classified, intentional self-harm, initial encounter: Secondary | ICD-10-CM | POA: Diagnosis present

## 2010-11-04 DIAGNOSIS — J690 Pneumonitis due to inhalation of food and vomit: Secondary | ICD-10-CM

## 2010-11-04 DIAGNOSIS — I10 Essential (primary) hypertension: Secondary | ICD-10-CM | POA: Diagnosis present

## 2010-11-04 DIAGNOSIS — F329 Major depressive disorder, single episode, unspecified: Secondary | ICD-10-CM | POA: Diagnosis present

## 2010-11-04 DIAGNOSIS — F3289 Other specified depressive episodes: Secondary | ICD-10-CM | POA: Diagnosis present

## 2010-11-04 DIAGNOSIS — D72829 Elevated white blood cell count, unspecified: Secondary | ICD-10-CM | POA: Diagnosis present

## 2010-11-04 DIAGNOSIS — T50904A Poisoning by unspecified drugs, medicaments and biological substances, undetermined, initial encounter: Secondary | ICD-10-CM

## 2010-11-04 DIAGNOSIS — T40601A Poisoning by unspecified narcotics, accidental (unintentional), initial encounter: Principal | ICD-10-CM | POA: Diagnosis present

## 2010-11-04 DIAGNOSIS — T394X2A Poisoning by antirheumatics, not elsewhere classified, intentional self-harm, initial encounter: Secondary | ICD-10-CM | POA: Diagnosis present

## 2010-11-04 DIAGNOSIS — F1910 Other psychoactive substance abuse, uncomplicated: Secondary | ICD-10-CM

## 2010-11-04 DIAGNOSIS — T43501A Poisoning by unspecified antipsychotics and neuroleptics, accidental (unintentional), initial encounter: Secondary | ICD-10-CM | POA: Diagnosis present

## 2010-11-04 DIAGNOSIS — F29 Unspecified psychosis not due to a substance or known physiological condition: Secondary | ICD-10-CM | POA: Diagnosis present

## 2010-11-04 DIAGNOSIS — T43502A Poisoning by unspecified antipsychotics and neuroleptics, intentional self-harm, initial encounter: Secondary | ICD-10-CM | POA: Diagnosis present

## 2010-11-04 DIAGNOSIS — Z79899 Other long term (current) drug therapy: Secondary | ICD-10-CM

## 2010-11-04 DIAGNOSIS — T50901A Poisoning by unspecified drugs, medicaments and biological substances, accidental (unintentional), initial encounter: Secondary | ICD-10-CM

## 2010-11-04 DIAGNOSIS — E119 Type 2 diabetes mellitus without complications: Secondary | ICD-10-CM | POA: Diagnosis present

## 2010-11-04 DIAGNOSIS — M109 Gout, unspecified: Secondary | ICD-10-CM | POA: Diagnosis present

## 2010-11-04 DIAGNOSIS — G894 Chronic pain syndrome: Secondary | ICD-10-CM | POA: Diagnosis present

## 2010-11-04 DIAGNOSIS — R404 Transient alteration of awareness: Secondary | ICD-10-CM | POA: Diagnosis present

## 2010-11-04 DIAGNOSIS — F17200 Nicotine dependence, unspecified, uncomplicated: Secondary | ICD-10-CM | POA: Diagnosis present

## 2010-11-04 LAB — COMPREHENSIVE METABOLIC PANEL
AST: 36 U/L (ref 0–37)
Alkaline Phosphatase: 60 U/L (ref 39–117)
CO2: 25 mEq/L (ref 19–32)
Chloride: 103 mEq/L (ref 96–112)
Creatinine, Ser: 1.03 mg/dL (ref 0.50–1.35)
GFR calc non Af Amer: >60 mL/min (ref >60)
Potassium: 5.7 mEq/L — ABNORMAL HIGH (ref 3.5–5.1)
Total Bilirubin: 0.2 mg/dL — ABNORMAL LOW (ref 0.3–1.2)

## 2010-11-04 LAB — DIFFERENTIAL
Basophils Absolute: 0 10*3/uL (ref 0.0–0.1)
Eosinophils Absolute: 0.1 10*3/uL (ref 0.0–0.7)
Lymphs Abs: 1.8 10*3/uL (ref 0.7–4.0)
Monocytes Absolute: 1 10*3/uL (ref 0.1–1.0)

## 2010-11-04 LAB — URINALYSIS, ROUTINE W REFLEX MICROSCOPIC
Glucose, UA: NEGATIVE mg/dL
Hgb urine dipstick: NEGATIVE
Ketones, ur: NEGATIVE mg/dL
Protein, ur: NEGATIVE mg/dL

## 2010-11-04 LAB — POCT I-STAT 3, ART BLOOD GAS (G3+)
Acid-base deficit: 5 mmol/L — ABNORMAL HIGH (ref 0.0–2.0)
pO2, Arterial: 83 mmHg (ref 80.0–100.0)

## 2010-11-04 LAB — ACETAMINOPHEN LEVEL
Acetaminophen (Tylenol), Serum: 31.9 ug/mL — ABNORMAL HIGH (ref 10–30)
Acetaminophen (Tylenol), Serum: 15.4 ug/mL (ref 10–30)

## 2010-11-04 LAB — POCT I-STAT, CHEM 8
Calcium, Ion: 1.16 mmol/L (ref 1.12–1.32)
Glucose, Bld: 197 mg/dL — ABNORMAL HIGH (ref 70–99)
HCT: 43 % (ref 39.0–52.0)
Hemoglobin: 14.6 g/dL (ref 13.0–17.0)
Potassium: 5.7 mEq/L — ABNORMAL HIGH (ref 3.5–5.1)

## 2010-11-04 LAB — CBC
MCH: 28.9 pg (ref 26.0–34.0)
MCHC: 35.3 g/dL (ref 30.0–36.0)
Platelets: 152 10*3/uL (ref 150–400)

## 2010-11-04 LAB — PROTIME-INR: Prothrombin Time: 15.1 seconds (ref 11.6–15.2)

## 2010-11-04 LAB — GLUCOSE, CAPILLARY: Glucose-Capillary: 177 mg/dL — ABNORMAL HIGH (ref 70–99)

## 2010-11-04 LAB — BASIC METABOLIC PANEL
BUN: 10 mg/dL (ref 6–23)
CO2: 27 mEq/L (ref 19–32)
Chloride: 104 mEq/L (ref 96–112)
Creatinine, Ser: 0.9 mg/dL (ref 0.50–1.35)
Glucose, Bld: 156 mg/dL — ABNORMAL HIGH (ref 70–99)
Potassium: 6.6 mEq/L (ref 3.5–5.1)

## 2010-11-04 LAB — RAPID URINE DRUG SCREEN, HOSP PERFORMED
Amphetamines: NOT DETECTED
Barbiturates: NOT DETECTED
Benzodiazepines: POSITIVE — AB
Tetrahydrocannabinol: NOT DETECTED

## 2010-11-04 LAB — SALICYLATE LEVEL: Salicylate Lvl: <2 mg/dL — ABNORMAL LOW (ref 2.8–20.0)
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
## 2010-11-05 ENCOUNTER — Inpatient Hospital Stay (HOSPITAL_COMMUNITY): Payer: Medicaid Other

## 2010-11-05 DIAGNOSIS — F29 Unspecified psychosis not due to a substance or known physiological condition: Secondary | ICD-10-CM

## 2010-11-05 LAB — CBC
HCT: 41.5 % (ref 39.0–52.0)
Hemoglobin: 14.3 g/dL (ref 13.0–17.0)
MCH: 28.5 pg (ref 26.0–34.0)
MCV: 82.7 fL (ref 78.0–100.0)
RBC: 5.02 MIL/uL (ref 4.22–5.81)
WBC: 9.5 10*3/uL (ref 4.0–10.5)

## 2010-11-05 LAB — GLUCOSE, CAPILLARY: Glucose-Capillary: 116 mg/dL — ABNORMAL HIGH (ref 70–99)

## 2010-11-05 LAB — COMPREHENSIVE METABOLIC PANEL
AST: 33 U/L (ref 0–37)
CO2: 28 mEq/L (ref 19–32)
Calcium: 8.5 mg/dL (ref 8.4–10.5)
Chloride: 104 mEq/L (ref 96–112)
Creatinine, Ser: 0.75 mg/dL (ref 0.50–1.35)
GFR calc Af Amer: >60 mL/min (ref >60)
GFR calc non Af Amer: >60 mL/min (ref >60)
Glucose, Bld: 121 mg/dL — ABNORMAL HIGH (ref 70–99)
Total Bilirubin: 0.7 mg/dL (ref 0.3–1.2)

## 2010-11-05 LAB — URINE CULTURE

## 2010-11-05 NOTE — Consult Note (Signed)
[name], [name] NO.:  [id number]  MEDICAL RECORD NO.:  [id number]  LOCATION:  2607                         FACILITY:  [facility]  PHYSICIAN:  Franchot Gallo, MD     DATE OF BIRTH:  December 18, 1970  DATE OF CONSULTATION:  11/05/2010 DATE OF DISCHARGE:                                CONSULTATION   CHIEF COMPLAINT:  "I took a whole lot of my Risperdal."  HISTORY OF PRESENT ILLNESS:  [name] is a 40 year old married [place] male who was admitted to [facility] after ingesting approximately thirty 2 mg Risperdal tablets.  The patient gives conflicting information stating first that he was angry and took the medication.  He then later told nursing that he took the medication secondary to "being in pain."  When this provider questioned him, he stated he took the medication in order to sleep.  The patient also stated that he has not had any suicidal attempts in the past, but according to the patient's records he has been admitted to [facility] recently on 2 occasions for suicide attempts/gestures.  Currently, the patient states that he is sleeping well without difficulty and reports good appetite and denies any auditory or visual hallucinations or delusional thinking.  He does however take the medication, Risperdal for a psychotic disorder.  Again, the patient denies suicidal or homicidal ideations at the time of assessment, but again reports conflicting information when different individuals obtain history.  The patient does report a history of hearing voices as well as paranoid thoughts and episodes of depression, anger, agitation, and insomnia.  In my opinion, he information provided by the patient is somewhat questionable considering his past history of several suicide attempts and hospitalizations for overuse of medications.  It would appear prudent to transfer him to [facility] for further assessment and treatment once he is  medically cleared.  PAST PSYCHIATRIC HISTORY:  As stated above, the patient reports at least 2 past psychiatric hospitalizations for suicide attempts/gestures.  He is currently being seen by Dr. 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  CURRENT MEDICATIONS PRIOR TO ADMISSION: 1. Risperdal 2 mg p.o. at bedtime. 2. Cogentin 1 mg p.o. b.i.d. 3. Colchicine 0.6 mg b.i.d. 4. Hydrochlorothiazide 25 mg p.o. q.a.m. 5. Norvasc 10 mg p.o. q.a.m. 6. Potassium chloride 10 mEq p.o. q.a.m.  ALLERGIES: 1. LYRICA - the patient unsure of reaction. 2. BENTYL - itching. 3. SEAFOOD/SHELLFISH.  MEDICAL ILLNESSES: 1. Non-insulin dependent diabetes mellitus. 2. Gout. 3. Hypertension.  PAST OPERATIONS:  None reported.  FAMILY HISTORY:  The patient denies any family history of psychiatric or substance related illnesses in his family.  SOCIAL HISTORY:  The patient was born in [place] and at the age of 43 moved to the [place].  He currently lives in [place] with his wife of 15 years and his daughter.  The patient denies any formal education and is currently applying for disability.  He is currently unemployed.  The patient reports to abusing alcohol in the past, but denies any use of alcohol since 2009.  He also denies any use of illicit drugs.  MENTAL  STATUS EXAM:  General - the patient was alert and oriented x3 and somewhat appropriate and cooperative during the evaluation.  The patient however appeared to become angry at times when he was instructed that he may not be able to leave the hospital today.  Speech was appropriate in terms of rate and volume, but with increase in volume when angry.  Mood appeared mildly depressed.  Affect was irritable.  Thoughts - the patient denied any current auditory or visual hallucinations as well as any delusional thinking.  He denies any suicidal or homicidal ideation. Judgment and insight today both  appear fair to poor.  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 Rule out Major Depressive Disorder with Psychotic Features. 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 Axis II - deferred. Axis III - Please see medical history above. Axis IV - Serious chronic mental illness.  Possibly some marital discord.  Unemployment.  Financial constraints. Axis V:  GAF at the time of admission approximately 45.  Highest GAF past year approximately 56.  PLAN: 1. Considering the inconsistencies in the patient's history and     considering his multiple past overdoses of medications, it is my     recommendation that he be transferred to [facility] once     medically cleared for further evaluation and treatment. 2. Please continue to monitor the patient with a one-to-one sitter     until transfer is complete. 3. Please contact Mental Health should further information or     assistance be needed.    _________________________________ Franchot Gallo, MD     RR/MEDQ  D:  11/05/2010  T:  11/05/2010  Job:  [id number]  Electronically Signed by Franchot Gallo MD on 11/05/2010 07:35:47 PM

## 2010-11-06 LAB — GLUCOSE, CAPILLARY
Glucose-Capillary: 106 mg/dL — ABNORMAL HIGH (ref 70–99)
Glucose-Capillary: 87 mg/dL (ref 70–99)
Glucose-Capillary: 103 mg/dL — ABNORMAL HIGH (ref 70–99)

## 2010-11-07 DIAGNOSIS — J690 Pneumonitis due to inhalation of food and vomit: Secondary | ICD-10-CM

## 2010-11-07 DIAGNOSIS — T50904A Poisoning by unspecified drugs, medicaments and biological substances, undetermined, initial encounter: Secondary | ICD-10-CM

## 2010-11-07 DIAGNOSIS — T50901A Poisoning by unspecified drugs, medicaments and biological substances, accidental (unintentional), initial encounter: Secondary | ICD-10-CM

## 2010-11-07 LAB — CBC
HCT: 40.6 % (ref 39.0–52.0)
Hemoglobin: 14.7 g/dL (ref 13.0–17.0)
MCH: 28.7 pg (ref 26.0–34.0)
MCV: 79.1 fL (ref 78.0–100.0)
Platelets: 183 10*3/uL (ref 150–400)
RBC: 5.13 MIL/uL (ref 4.22–5.81)
WBC: 9.2 10*3/uL (ref 4.0–10.5)

## 2010-11-07 LAB — GLUCOSE, CAPILLARY
Glucose-Capillary: 170 mg/dL — ABNORMAL HIGH (ref 70–99)
Glucose-Capillary: 106 mg/dL — ABNORMAL HIGH (ref 70–99)
Glucose-Capillary: 119 mg/dL — ABNORMAL HIGH (ref 70–99)
Glucose-Capillary: 98 mg/dL (ref 70–99)

## 2010-11-07 LAB — BASIC METABOLIC PANEL
BUN: 12 mg/dL (ref 6–23)
CO2: 24 mEq/L (ref 19–32)
Chloride: 104 mEq/L (ref 96–112)
Creatinine, Ser: 0.62 mg/dL (ref 0.50–1.35)
Glucose, Bld: 100 mg/dL — ABNORMAL HIGH (ref 70–99)

## 2010-11-08 LAB — GLUCOSE, CAPILLARY

## 2010-11-14 NOTE — Discharge Summary (Signed)
  NAMERAFFAEL, BUGARIN NO.:  [id number]  MEDICAL RECORD NO.:  [id number]  LOCATION:  3017                         FACILITY:  [facility]  PHYSICIAN:  C. Ulyess Mort, M.D.DATE OF BIRTH:  [birth year]  DATE OF ADMISSION:  11/04/2010 DATE OF DISCHARGE:  11/08/2010                              DISCHARGE SUMMARY   The patient was to be discharged to behavioral health on November 08, 2010.  The patient actually left AMA before being transferred.  The patient was eventually found and placed in police custody as per nursing.  The last set of vitals for the patient are as follows: temperature 98.2, pulse 63, respirations 19, blood pressure 134/85, saturating 100% on room air.    ______________________________ Dustin Jefferson, MD   ______________________________ C. Ulyess Mort, M.D.    NK/MEDQ  D:  11/09/2010  T:  11/10/2010  Job:  [id number]  Electronically Signed by Dustin Jefferson MD on 11/10/2010 05:14:06 PM Electronically Signed by Dustin Hansen M.D. on 11/14/2010 08:46:01 AM

## 2010-11-14 NOTE — Discharge Summary (Signed)
NAMESAVEON, PLANT NO.:  [id number]  MEDICAL RECORD NO.:  [id number]  LOCATION:  3017                         FACILITY:  [facility]  PHYSICIAN:  C. Ulyess Mort, M.D.DATE OF BIRTH:  1970-12-20  DATE OF ADMISSION:  11/04/2010 DATE OF DISCHARGE:  11/07/2010                              DISCHARGE SUMMARY   DISCHARGE DIAGNOSES: 1. Psychosis, NOS. 2. Polysubstance overdose. 3. Chronic pain syndrome. 4. Diabetes mellitus. 5. Hypertension.  DISCHARGE MEDICATIONS: 1. Clindamycin 300 mg capsules 600 mg by mouth twice daily for 4 days. 2. Benztropine 1 mg 1 tablet by mouth twice daily. 3. Colchicine 0.6 mg 1 tablet by mouth twice daily. 4. Cyclobenzaprine 10 mg half a tablet by mouth twice daily. 5. Diphenhydramine 25 mg 1 capsule by mouth daily as needed to sleep. 6. Lisinopril 10 mg 2 tablets every morning, 1 tablet every evening by     mouth twice daily. 7. Omeprazole 40 mg 1 capsule by mouth twice daily. 8. Potassium chloride 10 mEq 1 tablet by mouth daily. 9. Sertraline 50 mg 1 tablet by mouth every morning. 10.Stomach relief pink bismuth 262 mg 1 tablet by mouth three times a     day as needed for upset stomach.  DISPOSITION AND FOLLOWUP:  Dustin Hansen is medically stable for discharge to behavioral health for further evaluation and treatment of psychological issues.  At the time of discharge, he denies suicidal ideation or feelings of self-harm.  PROCEDURES:  During hospitalization are as follows:  CT head without contrast on November 04, 2010, revealed chronic ischemic changes in the deep white matter and globus pallidus bilaterally, question chronic hypoxic ischemic insult, no acute abnormality.  Chest x-ray on November 04, 2010, revealed hypoventilation with bibasilar atelectasis.  Left upper lobe and left lower lobe airspace disease may represent pneumonia. Chest x-ray on November 05, 2010, revealed interval improvement in left lower lobe airspace disease.   Additional scattered pulmonary opacities are unchanged.  CONSULTATIONS:  During hospitalization, Psychiatry.  HISTORY OF PRESENT ILLNESS:  The patient is a 40 year old male, who presents to the ED with unresponsiveness for an unknown period of time. The history was provided by the EMS and prior chart.  The patient brought in unresponsive by EMS with questionable seizure.  He was intubated and brought to [facility] ED.  Review of prior ED chart shows numerous visits for back pain.  Paramedics brought a large bag of medications, which included muscle relaxants and Percocet.  The patient was treated prior to ED evaluation by EMS with intubation and IV fluid resulting in no relief.  PHYSICAL EXAMINATION:  VITAL SIGNS:  At admission, temperature 98.6, blood pressure 172/105, heart rate 104, respiratory rate 18, O2 saturation 93% on 4 liters nasal cannula. GENERAL:  Obtunded, arousable to verbal and tactile stimuli. HEENT:  Head, normocephalic, atraumatic.  Eyes, no injection and anicteric.  Mouth, drooling. LUNGS:  Normal respiratory effort.  No accessory muscle use.  Normal breath sounds.  No crackles.  No wheezes. HEART:  Tachycardic.  No murmurs, rubs, or gallops. ABDOMEN:  Soft, nontender.  Normoactive bowel sounds.  No distention. No guarding.  No rebound tenderness.  No hepatomegaly.  No splenomegaly. MUSCULOSKELETAL:  No joint swelling.  No warmth.  No redness over joints.  Pulses 2+ DP/PT pulses bilaterally. EXTREMITIES:  No cyanosis, clubbing, or edema. NEUROLOGIC:  Difficult to arouse, obtunded.  Unable to assess cranial nerves, sensation and motor strength. SKIN:  Turgor normal.  No rashes.  LABORATORY DATA:  Labs at admission are as follows.  Sodium 135, potassium 5.7, chloride 103, CO2 of 25, glucose 193, BUN 10, creatinine 1.03, calcium 8.7, AST 36, ALT 38, alkaline phosphatase 60, bilirubin total 0.2, protein 6.1, albumin 3.9.  White blood count 11.1, hemoglobin 14.6,  hematocrit 41.4, MCV 82, platelets 152.  Capillary glucose 177. Urine drug screen was positive for benzodiazepine.  Urinalysis was within normal limits.  Acetaminophen was 31.9.  HOSPITAL COURSE: 1. Altered mental status secondary to polysubstance overdose.  The     patient reports taking Risperdal.  Urine drug screen was positive     for benzodiazepines and there were elevated levels of acetaminophen     and blood.  Oxycodone may have been involved as well, though levels     have not returned.  The patient had several empty bottles of     medications with him, which he reports to be old but many were     dated within the last week or two prior to hospitalization.     Medications include sertraline, oxycodone, acetaminophen,     cyclobenzaprine, methocarbamol, zolpidem, Lyrica, diazepam,     hydroxyzine, and benztropine.  Medications were prescribed by     several different providers.  The patient was given 1 unit of     Narcan in the ED and subsequently mental status improved.  He was     extubated in the ED and placed on CIWA protocol with a sitter in     the step-down unit.  Once he was alert on day two of admission, he     reported no suicidal ideation nor feeling of self-harm.  Given his     extensive psychiatric history, Psychiatry was consulted whose plan     was considering the inconsistencies in the patient's history and     multiple past overdoses of medications, he be transferred to     behavioral health once medically cleared for further evaluation and     treatment.  We continued to monitor him with one-to-one sitter     until transfer is complete.  The patient is currently medically     stable and should be transferred to behavioral health. 2. Aspiration pneumonia suggested by chest x-ray and leukocytosis.     The patient initially treated with IV Zosyn and switched to p.o.     clindamycin to be completed on November 11, 2010, so that he has been     treated for 8 days  total. 3. Depression.  The patient was continued on home dose of sertraline.     Once the patient was alert, he denied suicidal ideation though his     recollection of recent event was inconsistent.  He was unable to     identify why he took extra pills prior to admission, giving     different reasons to different providers or stating I do not know.     For this reason, he will go to the inpatient behavioral health.  He     has been admitted to behavioral health twice in the past for     suicidal ideation. 4. Hypertension, stable throughout hospitalization.  No medical  intervention during hospitalization.  DISCHARGE VITALS:  Temperature 98.1, pulse 65, respirations 16, blood pressure 162/106.  Blood pressures had been trending in the 120s to 150s systolic.  Blood pressure will be repeated prior to discharge.  O2 saturation 95% on room air.  ADDENDUM: Patient was waiting for a bed at behavioral health.  He left AMA on November 08, 2010.  His last set of vitals were: T: 98.2, HR: 63  RR: 19  BP: 134/85  O2: 100% RA  Security was called and the patient may be apprehended by the authorities.    ______________________________ Dustin Jefferson, MD   ______________________________ C. Ulyess Mort, M.D.    NK/MEDQ  D:  11/07/2010  T:  11/07/2010  Job:  [id number]  Electronically Signed by Dustin Jefferson MD on 11/09/2010 05:13:08 PM Electronically Signed by Eliezer Lofts M.D. on 11/14/2010 08:45:29 AM

## 2010-12-12 LAB — URINALYSIS, ROUTINE W REFLEX MICROSCOPIC
Bilirubin Urine: NEGATIVE
Bilirubin Urine: NEGATIVE
Glucose, UA: NEGATIVE
Glucose, UA: NEGATIVE
Hgb urine dipstick: NEGATIVE
Hgb urine dipstick: NEGATIVE
Ketones, ur: NEGATIVE
Ketones, ur: NEGATIVE
Nitrite: NEGATIVE
Nitrite: POSITIVE — AB
Protein, ur: NEGATIVE
Specific Gravity, Urine: 1.013
Specific Gravity, Urine: 1.015
Urobilinogen, UA: 0.2
Urobilinogen, UA: 1
pH: 6
pH: 6
pH: 7.5

## 2010-12-12 LAB — CBC
HCT: 52
HCT: 54.6 — ABNORMAL HIGH
Hemoglobin: 17
Hemoglobin: 18.7 — ABNORMAL HIGH
MCHC: 34.1
MCHC: 34.3
MCV: 85.3
MCV: 85.5
Platelets: 148 — ABNORMAL LOW
Platelets: 160
RBC: 5.76
RBC: 6.38 — ABNORMAL HIGH
RDW: 12.2
RDW: 12.2
RDW: 12.3
WBC: 5.7
WBC: 6.8
WBC: 9.8

## 2010-12-12 LAB — COMPREHENSIVE METABOLIC PANEL
ALT: 21
ALT: 34
AST: 30
Albumin: 4.1
Albumin: 4.4
Alkaline Phosphatase: 47
Alkaline Phosphatase: 62
BUN: 9
BUN: 9
CO2: 27
CO2: 28
Calcium: 8.9
Calcium: 9.2
Chloride: 100
Chloride: 103
Creatinine, Ser: 0.89
Creatinine, Ser: 1.02
GFR calc Af Amer: >60
GFR calc non Af Amer: >60
GFR calc non Af Amer: >60
Glucose, Bld: 102 — ABNORMAL HIGH
Glucose, Bld: 106 — ABNORMAL HIGH
Potassium: 3.6
Potassium: 3.7
Sodium: 138
Sodium: 138
Total Bilirubin: 2 — ABNORMAL HIGH
Total Bilirubin: 2.3 — ABNORMAL HIGH
Total Protein: 6.3
Total Protein: 6.7

## 2010-12-12 LAB — URINE MICROSCOPIC-ADD ON

## 2010-12-12 LAB — DIFFERENTIAL
Basophils Absolute: 0
Basophils Absolute: 0
Basophils Relative: 0
Basophils Relative: 1
Eosinophils Absolute: 0.1
Eosinophils Absolute: 0.1
Eosinophils Relative: 1
Lymphocytes Relative: 23
Lymphocytes Relative: 25
Lymphs Abs: 1.3
Lymphs Abs: 2.5
Monocytes Absolute: 0.4
Monocytes Absolute: 0.7
Monocytes Absolute: 0.7
Monocytes Relative: 6
Monocytes Relative: 7
Neutro Abs: 3.6
Neutro Abs: 6.6
Neutrophils Relative %: 55
Neutrophils Relative %: 67

## 2010-12-12 LAB — URINE CULTURE

## 2010-12-12 LAB — LIPASE, BLOOD
Lipase: 17
Lipase: 22

## 2010-12-13 LAB — URINALYSIS, ROUTINE W REFLEX MICROSCOPIC
Bilirubin Urine: NEGATIVE
Glucose, UA: NEGATIVE
Hgb urine dipstick: NEGATIVE
Ketones, ur: NEGATIVE
Protein, ur: NEGATIVE

## 2010-12-13 LAB — URINE CULTURE: Culture: NO GROWTH

## 2010-12-15 LAB — COMPREHENSIVE METABOLIC PANEL
ALT: 35
AST: 38 — ABNORMAL HIGH
Alkaline Phosphatase: 54
CO2: 27
GFR calc non Af Amer: >60
Glucose, Bld: 107 — ABNORMAL HIGH
Potassium: 4.2
Sodium: 135

## 2010-12-15 LAB — DIFFERENTIAL
Basophils Relative: 0
Eosinophils Absolute: 0.1
Eosinophils Relative: 2
Monocytes Relative: 7
Neutrophils Relative %: 59

## 2010-12-15 LAB — CBC
Hemoglobin: 16.2
RBC: 5.52
WBC: 7.3

## 2010-12-15 LAB — URINALYSIS, ROUTINE W REFLEX MICROSCOPIC
Hgb urine dipstick: NEGATIVE
Ketones, ur: NEGATIVE
Protein, ur: NEGATIVE
Urobilinogen, UA: 0.2

## 2010-12-19 ENCOUNTER — Emergency Department (HOSPITAL_COMMUNITY)
Admission: EM | Admit: 2010-12-19 | Discharge: 2010-12-19 | Disposition: A | Discharge status: Home / Self Care | Payer: Medicaid Other | Attending: Emergency Medicine | Admitting: Emergency Medicine

## 2010-12-19 DIAGNOSIS — I10 Essential (primary) hypertension: Secondary | ICD-10-CM | POA: Insufficient documentation

## 2010-12-19 DIAGNOSIS — R51 Headache: Secondary | ICD-10-CM | POA: Insufficient documentation

## 2010-12-19 DIAGNOSIS — E119 Type 2 diabetes mellitus without complications: Secondary | ICD-10-CM | POA: Insufficient documentation

## 2010-12-19 DIAGNOSIS — G40909 Epilepsy, unspecified, not intractable, without status epilepticus: Secondary | ICD-10-CM | POA: Insufficient documentation

## 2010-12-19 LAB — CBC
Hemoglobin: 16.9
MCH: 29.5 pg (ref 26.0–34.0)
MCHC: 36.3 g/dL — ABNORMAL HIGH (ref 30.0–36.0)
MCHC: 33.9
MCV: 81.2 fL (ref 78.0–100.0)
MCV: 85.3
Platelets: 192 10*3/uL (ref 150–400)
RBC: 5.8 MIL/uL (ref 4.22–5.81)
RBC: 5.86 — ABNORMAL HIGH
RDW: 13.3 % (ref 11.5–15.5)
WBC: 8.6

## 2010-12-19 LAB — DIFFERENTIAL
Basophils Relative: 1 % (ref 0–1)
Basophils Relative: 1
Eosinophils Absolute: 0.2 10*3/uL (ref 0.0–0.7)
Eosinophils Absolute: 0.2
Eosinophils Relative: 3 % (ref 0–5)
Lymphs Abs: 2.4 10*3/uL (ref 0.7–4.0)
Lymphs Abs: 2.2
Monocytes Absolute: 0.6
Monocytes Relative: 9 % (ref 3–12)
Monocytes Relative: 7
Neutrophils Relative %: 61 % (ref 43–77)
Neutrophils Relative %: 64

## 2010-12-19 LAB — COMPREHENSIVE METABOLIC PANEL
ALT: 19 U/L (ref 0–53)
ALT: 30
AST: 17 U/L (ref 0–37)
Albumin: 3.9 g/dL (ref 3.5–5.2)
Albumin: 4.1
Alkaline Phosphatase: 55
BUN: 11
Calcium: 9.6 mg/dL (ref 8.4–10.5)
Chloride: 106
Creatinine, Ser: 0.8 mg/dL (ref 0.50–1.35)
GFR calc non Af Amer: >90 mL/min (ref >90)
Glucose, Bld: 94
Potassium: 3.9
Sodium: 140 mEq/L (ref 135–145)
Sodium: 141
Total Bilirubin: 1.7 — ABNORMAL HIGH
Total Protein: 7.2 g/dL (ref 6.0–8.3)

## 2010-12-19 LAB — URINALYSIS, ROUTINE W REFLEX MICROSCOPIC
Glucose, UA: NEGATIVE
Hgb urine dipstick: NEGATIVE
Protein, ur: NEGATIVE
pH: 6

## 2010-12-19 LAB — POCT I-STAT, CHEM 8
Calcium, Ion: 1.21
Chloride: 104
Creatinine, Ser: 1.1
Glucose, Bld: 86
HCT: 52
Hemoglobin: 17.7 — ABNORMAL HIGH
Potassium: 4.2

## 2010-12-19 LAB — POCT URINALYSIS DIP (DEVICE)
Bilirubin Urine: NEGATIVE
Hgb urine dipstick: NEGATIVE
Ketones, ur: NEGATIVE
Protein, ur: NEGATIVE
pH: 5.5

## 2010-12-19 LAB — PROTIME-INR: Prothrombin Time: 14.9

## 2010-12-19 LAB — LIPASE, BLOOD: Lipase: 21

## 2010-12-19 LAB — AMYLASE: Amylase: 67

## 2010-12-22 ENCOUNTER — Emergency Department (HOSPITAL_COMMUNITY)
Admission: EM | Admit: 2010-12-22 | Discharge: 2010-12-22 | Disposition: A | Discharge status: Home / Self Care | Payer: Medicaid Other | Attending: Emergency Medicine | Admitting: Emergency Medicine

## 2010-12-22 DIAGNOSIS — E119 Type 2 diabetes mellitus without complications: Secondary | ICD-10-CM | POA: Insufficient documentation

## 2010-12-22 DIAGNOSIS — I10 Essential (primary) hypertension: Secondary | ICD-10-CM | POA: Insufficient documentation

## 2010-12-22 DIAGNOSIS — R51 Headache: Secondary | ICD-10-CM | POA: Insufficient documentation

## 2010-12-22 DIAGNOSIS — R569 Unspecified convulsions: Secondary | ICD-10-CM | POA: Insufficient documentation

## 2010-12-27 LAB — URINE MICROSCOPIC-ADD ON

## 2010-12-27 LAB — URINALYSIS, ROUTINE W REFLEX MICROSCOPIC
Bilirubin Urine: NEGATIVE
Glucose, UA: NEGATIVE
Nitrite: NEGATIVE
Specific Gravity, Urine: 1.014
Urobilinogen, UA: 0.2
pH: 5.5
pH: 6

## 2010-12-27 LAB — COMPREHENSIVE METABOLIC PANEL
ALT: 80 — ABNORMAL HIGH
ALT: 65 — ABNORMAL HIGH
ALT: 82 — ABNORMAL HIGH
AST: 34
AST: 69 — ABNORMAL HIGH
Alkaline Phosphatase: 59
Alkaline Phosphatase: 46
BUN: 11
BUN: 7
CO2: 30
CO2: 30
Calcium: 9.2
Calcium: 8.5
Calcium: 8.9
Chloride: 104
Chloride: 108
Creatinine, Ser: 0.96
Creatinine, Ser: 0.89
GFR calc Af Amer: >60
GFR calc Af Amer: >60
GFR calc non Af Amer: >60
GFR calc non Af Amer: >60
Glucose, Bld: 100 — ABNORMAL HIGH
Glucose, Bld: 107 — ABNORMAL HIGH
Potassium: 4
Potassium: 3.7
Sodium: 140
Sodium: 138
Sodium: 141
Total Bilirubin: 1.7 — ABNORMAL HIGH
Total Bilirubin: 2.1 — ABNORMAL HIGH
Total Protein: 6.5
Total Protein: 6.5

## 2010-12-27 LAB — DIFFERENTIAL
Basophils Absolute: 0
Basophils Relative: 0
Eosinophils Absolute: 0.3
Eosinophils Relative: 5
Lymphocytes Relative: 33
Lymphocytes Relative: 30
Lymphs Abs: 2.2
Lymphs Abs: 2
Monocytes Relative: 7
Monocytes Relative: 7
Neutro Abs: 5.2
Neutro Abs: 5.6
Neutrophils Relative %: 61
Neutrophils Relative %: 66

## 2010-12-27 LAB — URINE CULTURE: Colony Count: >=100000

## 2010-12-27 LAB — CBC
HCT: 47.8
Hemoglobin: 16.5
Hemoglobin: 15.9
MCHC: 34.4
MCHC: 34.7
MCV: 83.9
MCV: 83.2
Platelets: 202
RBC: 5.6
RBC: 5.27
RDW: 12.9
RDW: 13.1
RDW: 13.1
WBC: 8.5
WBC: 10.6 — ABNORMAL HIGH

## 2010-12-27 LAB — LIPASE, BLOOD
Lipase: 17
Lipase: 23

## 2010-12-28 LAB — POCT CARDIAC MARKERS
CKMB, poc: 1.6
Myoglobin, poc: 152
Operator id: 288831

## 2010-12-28 LAB — URINALYSIS, ROUTINE W REFLEX MICROSCOPIC
Glucose, UA: NEGATIVE
Hgb urine dipstick: NEGATIVE
Ketones, ur: NEGATIVE
Protein, ur: NEGATIVE
Urobilinogen, UA: 0.2

## 2010-12-28 LAB — DIFFERENTIAL
Basophils Absolute: 0
Basophils Relative: 0
Lymphocytes Relative: 10 — ABNORMAL LOW
Monocytes Relative: 4
Neutro Abs: 10.9 — ABNORMAL HIGH
Neutrophils Relative %: 86 — ABNORMAL HIGH

## 2010-12-28 LAB — CBC
HCT: 49.2
Hemoglobin: 17
MCHC: 34.4
MCV: 83.9
Platelets: 171
RDW: 12.8

## 2010-12-28 LAB — COMPREHENSIVE METABOLIC PANEL
Albumin: 4.3
Alkaline Phosphatase: 54
BUN: 11
Creatinine, Ser: 1.11
Glucose, Bld: 139 — ABNORMAL HIGH
Potassium: 3.6
Total Bilirubin: 0.9
Total Protein: 6.5

## 2010-12-28 LAB — URINE MICROSCOPIC-ADD ON

## 2011-01-05 ENCOUNTER — Emergency Department (HOSPITAL_COMMUNITY)
Admission: EM | Admit: 2011-01-05 | Discharge: 2011-01-05 | Discharge status: Against Medical Advice | Payer: Medicaid Other | Attending: Emergency Medicine | Admitting: Emergency Medicine

## 2011-01-05 DIAGNOSIS — R51 Headache: Secondary | ICD-10-CM | POA: Insufficient documentation

## 2011-01-09 ENCOUNTER — Emergency Department (HOSPITAL_COMMUNITY)
Admission: EM | Admit: 2011-01-09 | Discharge: 2011-01-09 | Discharge status: Against Medical Advice | Payer: Medicaid Other | Attending: Emergency Medicine | Admitting: Emergency Medicine

## 2011-01-09 DIAGNOSIS — G4700 Insomnia, unspecified: Secondary | ICD-10-CM | POA: Insufficient documentation

## 2011-01-09 DIAGNOSIS — R51 Headache: Secondary | ICD-10-CM | POA: Insufficient documentation

## 2011-01-14 ENCOUNTER — Emergency Department (HOSPITAL_COMMUNITY)
Admission: EM | Admit: 2011-01-14 | Discharge: 2011-01-15 | Disposition: A | Discharge status: Home / Self Care | Payer: Medicaid Other | Attending: Emergency Medicine | Admitting: Emergency Medicine

## 2011-01-14 DIAGNOSIS — G40909 Epilepsy, unspecified, not intractable, without status epilepticus: Secondary | ICD-10-CM | POA: Insufficient documentation

## 2011-01-14 DIAGNOSIS — Z8639 Personal history of other endocrine, nutritional and metabolic disease: Secondary | ICD-10-CM | POA: Insufficient documentation

## 2011-01-14 DIAGNOSIS — G4700 Insomnia, unspecified: Secondary | ICD-10-CM | POA: Insufficient documentation

## 2011-01-14 DIAGNOSIS — E119 Type 2 diabetes mellitus without complications: Secondary | ICD-10-CM | POA: Insufficient documentation

## 2011-01-14 DIAGNOSIS — I10 Essential (primary) hypertension: Secondary | ICD-10-CM | POA: Insufficient documentation

## 2011-01-14 DIAGNOSIS — Z862 Personal history of diseases of the blood and blood-forming organs and certain disorders involving the immune mechanism: Secondary | ICD-10-CM | POA: Insufficient documentation

## 2011-01-14 DIAGNOSIS — Z79899 Other long term (current) drug therapy: Secondary | ICD-10-CM | POA: Insufficient documentation

## 2011-01-17 ENCOUNTER — Emergency Department (HOSPITAL_COMMUNITY)
Admission: EM | Admit: 2011-01-17 | Discharge: 2011-01-17 | Disposition: A | Discharge status: Home / Self Care | Payer: Medicaid Other | Attending: Emergency Medicine | Admitting: Emergency Medicine

## 2011-01-17 DIAGNOSIS — I10 Essential (primary) hypertension: Secondary | ICD-10-CM | POA: Insufficient documentation

## 2011-01-17 DIAGNOSIS — R51 Headache: Secondary | ICD-10-CM | POA: Insufficient documentation

## 2011-01-17 DIAGNOSIS — E119 Type 2 diabetes mellitus without complications: Secondary | ICD-10-CM | POA: Insufficient documentation

## 2011-01-17 DIAGNOSIS — H53149 Visual discomfort, unspecified: Secondary | ICD-10-CM | POA: Insufficient documentation

## 2011-01-17 DIAGNOSIS — R110 Nausea: Secondary | ICD-10-CM | POA: Insufficient documentation

## 2011-01-18 ENCOUNTER — Emergency Department (HOSPITAL_COMMUNITY)
Admission: EM | Admit: 2011-01-18 | Discharge: 2011-01-18 | Disposition: A | Discharge status: Home / Self Care | Payer: Medicaid Other | Attending: Emergency Medicine | Admitting: Emergency Medicine

## 2011-01-18 DIAGNOSIS — M2569 Stiffness of other specified joint, not elsewhere classified: Secondary | ICD-10-CM | POA: Insufficient documentation

## 2011-01-18 DIAGNOSIS — G40909 Epilepsy, unspecified, not intractable, without status epilepticus: Secondary | ICD-10-CM | POA: Insufficient documentation

## 2011-01-18 DIAGNOSIS — E119 Type 2 diabetes mellitus without complications: Secondary | ICD-10-CM | POA: Insufficient documentation

## 2011-01-18 DIAGNOSIS — R110 Nausea: Secondary | ICD-10-CM | POA: Insufficient documentation

## 2011-01-18 DIAGNOSIS — Z862 Personal history of diseases of the blood and blood-forming organs and certain disorders involving the immune mechanism: Secondary | ICD-10-CM | POA: Insufficient documentation

## 2011-01-18 DIAGNOSIS — H53149 Visual discomfort, unspecified: Secondary | ICD-10-CM | POA: Insufficient documentation

## 2011-01-18 DIAGNOSIS — I10 Essential (primary) hypertension: Secondary | ICD-10-CM | POA: Insufficient documentation

## 2011-01-18 DIAGNOSIS — G4700 Insomnia, unspecified: Secondary | ICD-10-CM | POA: Insufficient documentation

## 2011-01-18 DIAGNOSIS — Z8639 Personal history of other endocrine, nutritional and metabolic disease: Secondary | ICD-10-CM | POA: Insufficient documentation

## 2011-01-18 DIAGNOSIS — R51 Headache: Secondary | ICD-10-CM | POA: Insufficient documentation

## 2011-01-19 ENCOUNTER — Emergency Department (HOSPITAL_COMMUNITY): Payer: Medicaid Other

## 2011-01-19 ENCOUNTER — Emergency Department (HOSPITAL_COMMUNITY)
Admission: EM | Admit: 2011-01-19 | Discharge: 2011-01-19 | Disposition: A | Discharge status: Home / Self Care | Payer: Medicaid Other | Source: Home / Self Care | Attending: Emergency Medicine | Admitting: Emergency Medicine

## 2011-01-19 ENCOUNTER — Emergency Department (HOSPITAL_COMMUNITY)
Admission: EM | Admit: 2011-01-19 | Discharge: 2011-01-19 | Disposition: A | Discharge status: Home / Self Care | Payer: Medicaid Other | Attending: Emergency Medicine | Admitting: Emergency Medicine

## 2011-01-19 DIAGNOSIS — R569 Unspecified convulsions: Secondary | ICD-10-CM | POA: Insufficient documentation

## 2011-01-19 DIAGNOSIS — M549 Dorsalgia, unspecified: Secondary | ICD-10-CM | POA: Insufficient documentation

## 2011-01-19 DIAGNOSIS — G4700 Insomnia, unspecified: Secondary | ICD-10-CM | POA: Insufficient documentation

## 2011-01-19 DIAGNOSIS — I10 Essential (primary) hypertension: Secondary | ICD-10-CM | POA: Insufficient documentation

## 2011-01-19 DIAGNOSIS — G8929 Other chronic pain: Secondary | ICD-10-CM | POA: Insufficient documentation

## 2011-01-19 DIAGNOSIS — E119 Type 2 diabetes mellitus without complications: Secondary | ICD-10-CM | POA: Insufficient documentation

## 2011-01-19 DIAGNOSIS — R079 Chest pain, unspecified: Secondary | ICD-10-CM | POA: Insufficient documentation

## 2011-01-19 LAB — URINALYSIS, ROUTINE W REFLEX MICROSCOPIC
Glucose, UA: NEGATIVE mg/dL
Leukocytes, UA: NEGATIVE
Nitrite: NEGATIVE
Specific Gravity, Urine: 1.009 (ref 1.005–1.030)
pH: 5.5 (ref 5.0–8.0)

## 2011-01-24 ENCOUNTER — Emergency Department (HOSPITAL_COMMUNITY)
Admission: EM | Admit: 2011-01-24 | Discharge: 2011-01-25 | Discharge status: Against Medical Advice | Payer: Medicaid Other | Attending: Emergency Medicine | Admitting: Emergency Medicine

## 2011-01-24 ENCOUNTER — Encounter: Payer: Self-pay | Admitting: *Deleted

## 2011-01-24 DIAGNOSIS — M549 Dorsalgia, unspecified: Secondary | ICD-10-CM | POA: Insufficient documentation

## 2011-01-24 NOTE — ED Notes (Signed)
The pt has been unable to sleep for 2-3 months.  Sometimes he has back pain

## 2011-01-25 ENCOUNTER — Encounter (HOSPITAL_COMMUNITY): Payer: Self-pay

## 2011-01-25 ENCOUNTER — Emergency Department (HOSPITAL_COMMUNITY)
Admission: EM | Admit: 2011-01-25 | Discharge: 2011-01-25 | Disposition: A | Discharge status: Home / Self Care | Payer: Medicaid Other | Attending: Emergency Medicine | Admitting: Emergency Medicine

## 2011-01-25 DIAGNOSIS — R300 Dysuria: Secondary | ICD-10-CM | POA: Insufficient documentation

## 2011-01-25 DIAGNOSIS — R079 Chest pain, unspecified: Secondary | ICD-10-CM | POA: Insufficient documentation

## 2011-01-25 DIAGNOSIS — M549 Dorsalgia, unspecified: Secondary | ICD-10-CM

## 2011-01-25 DIAGNOSIS — M545 Low back pain, unspecified: Secondary | ICD-10-CM | POA: Insufficient documentation

## 2011-01-25 DIAGNOSIS — Z79899 Other long term (current) drug therapy: Secondary | ICD-10-CM | POA: Insufficient documentation

## 2011-01-25 DIAGNOSIS — R5383 Other fatigue: Secondary | ICD-10-CM | POA: Insufficient documentation

## 2011-01-25 DIAGNOSIS — R159 Full incontinence of feces: Secondary | ICD-10-CM | POA: Insufficient documentation

## 2011-01-25 DIAGNOSIS — R209 Unspecified disturbances of skin sensation: Secondary | ICD-10-CM | POA: Insufficient documentation

## 2011-01-25 DIAGNOSIS — R5381 Other malaise: Secondary | ICD-10-CM | POA: Insufficient documentation

## 2011-01-25 DIAGNOSIS — G8929 Other chronic pain: Secondary | ICD-10-CM | POA: Insufficient documentation

## 2011-01-25 DIAGNOSIS — R109 Unspecified abdominal pain: Secondary | ICD-10-CM | POA: Insufficient documentation

## 2011-01-25 HISTORY — DX: Dorsalgia, unspecified: M54.9

## 2011-01-25 HISTORY — DX: Other chronic pain: G89.29

## 2011-01-25 MED ORDER — KETOROLAC TROMETHAMINE 30 MG/ML IJ SOLN
30.0000 mg | Freq: Once | INTRAMUSCULAR | Status: AC
  Administered 2011-01-25: 30 mg via INTRAVENOUS

## 2011-01-25 NOTE — ED Provider Notes (Signed)
History     CSN: [id number] Arrival date & time: 01/25/2011  8:51 AM   First MD Initiated Contact with Patient 01/25/11 0911      Chief Complaint  Patient presents with  . Back Pain    (Consider location/radiation/quality/duration/timing/severity/associated sxs/prior treatment) Patient is a 40 y.o. male presenting with back pain. The history is provided by the patient. No language interpreter was used.  Back Pain  This is a chronic problem. The current episode started yesterday. The problem occurs constantly. The problem has been gradually worsening. The pain is associated with twisting. The pain is present in the lumbar spine. The quality of the pain is described as aching. The pain does not radiate. The pain is at a severity of 5/10. The pain is mild. The symptoms are aggravated by bending, twisting and certain positions. The pain is the same all the time. Associated symptoms include chest pain, a fever, bowel incontinence, perianal numbness, dysuria, pelvic pain, paresthesias, paresis, tingling and weakness. He has tried nothing for the symptoms.  Reports unable to sleep because of bilateral lower back pain x 24 hours.  Denies injury  Past Medical History  Diagnosis Date  . Back pain, chronic     History reviewed. No pertinent past surgical history.  History reviewed. No pertinent family history.  History  Substance Use Topics  . Smoking status: Never Smoker   . Smokeless tobacco: Not on file  . Alcohol Use: Not on file      Review of Systems  Constitutional: Positive for fever.  Cardiovascular: Positive for chest pain.  Gastrointestinal: Positive for bowel incontinence.  Genitourinary: Positive for dysuria and pelvic pain.  Musculoskeletal: Positive for back pain.  Neurological: Positive for tingling, weakness and paresthesias.  All other systems reviewed and are negative.    Allergies  Review of patient's allergies indicates no known allergies.  Home  Medications   Current Outpatient Rx  Name Route Sig Dispense Refill  . PREGABALIN 50 MG PO CAPS Oral Take 50 mg by mouth 3 (three) times daily.      Marland Kitchen ZOLPIDEM TARTRATE 5 MG PO TABS Oral Take 5 mg by mouth at bedtime.        BP 162/116  Pulse 101  Temp(Src) 98.4 F (36.9 C) (Oral)  Resp 14  SpO2 99%  Physical Exam  Nursing note and vitals reviewed. Constitutional: He is oriented to person, place, and time. He appears well-developed and well-nourished.  Eyes: Pupils are equal, round, and reactive to light.  Neck: Normal range of motion. Neck supple.  Abdominal: Soft.  Musculoskeletal: He exhibits tenderness.       Bilateral lower back pain  Neurological: He is alert and oriented to person, place, and time.  Skin: Skin is warm and dry.  Psychiatric: He has a normal mood and affect.    ED Course  Procedures (including critical care time)  Labs Reviewed - No data to display No results found.   1. Back pain       MDM    Medical screening examination/treatment/procedure(s) were performed by non-physician practitioner and as supervising physician I was immediately available for consultation/collaboration. Osvaldo Human, M.D.       Jethro Bastos, NP 01/25/11 1140  Carleene Cooper III, MD 01/25/11 2130

## 2011-01-25 NOTE — ED Notes (Signed)
Pt. Having lower back pain, which pt. Has had for years.  Pt. Denies any injuries.  Pt. States, "I cannot sleep , I need a shot"

## 2011-01-31 ENCOUNTER — Encounter (HOSPITAL_COMMUNITY): Payer: Self-pay | Admitting: *Deleted

## 2011-01-31 ENCOUNTER — Emergency Department (HOSPITAL_COMMUNITY)
Admission: EM | Admit: 2011-01-31 | Discharge: 2011-01-31 | Disposition: A | Discharge status: Home / Self Care | Payer: Medicaid Other | Attending: Emergency Medicine | Admitting: Emergency Medicine

## 2011-01-31 ENCOUNTER — Emergency Department (HOSPITAL_COMMUNITY): Payer: Medicaid Other

## 2011-01-31 ENCOUNTER — Other Ambulatory Visit: Payer: Self-pay

## 2011-01-31 DIAGNOSIS — M545 Low back pain, unspecified: Secondary | ICD-10-CM | POA: Insufficient documentation

## 2011-01-31 DIAGNOSIS — I10 Essential (primary) hypertension: Secondary | ICD-10-CM | POA: Insufficient documentation

## 2011-01-31 DIAGNOSIS — G4700 Insomnia, unspecified: Secondary | ICD-10-CM | POA: Insufficient documentation

## 2011-01-31 LAB — POCT I-STAT, CHEM 8
BUN: 11 mg/dL (ref 6–23)
Calcium, Ion: 1.17 mmol/L (ref 1.12–1.32)
HCT: 52 % (ref 39.0–52.0)
Hemoglobin: 17.7 g/dL — ABNORMAL HIGH (ref 13.0–17.0)
TCO2: 28 mmol/L (ref 0–100)

## 2011-01-31 LAB — POCT I-STAT TROPONIN I: Troponin i, poc: 0 ng/mL (ref 0.00–0.08)

## 2011-01-31 LAB — D-DIMER, QUANTITATIVE: D-Dimer, Quant: <0.22 ug/mL-FEU (ref 0.00–0.48)

## 2011-01-31 MED ORDER — KETOROLAC TROMETHAMINE 30 MG/ML IJ SOLN
30.0000 mg | Freq: Once | INTRAMUSCULAR | Status: AC
  Administered 2011-01-31: 30 mg via INTRAVENOUS

## 2011-01-31 MED ORDER — KETOROLAC TROMETHAMINE 30 MG/ML IJ SOLN
INTRAMUSCULAR | Status: AC
  Filled 2011-01-31: qty 1

## 2011-01-31 MED ORDER — AMLODIPINE BESYLATE 5 MG PO TABS: 5.0000 mg | ORAL_TABLET | Freq: Every day | ORAL | Status: DC

## 2011-01-31 MED ORDER — LISINOPRIL 10 MG PO TABS: 10.0000 mg | ORAL_TABLET | Freq: Every day | ORAL | Status: DC

## 2011-01-31 NOTE — ED Notes (Signed)
Pt states that he had onset of sudden back pain last night.  Pt denies any injury.  Pt states that he wasn't able to sleep due to the pain.  Pt denies taking any medications for same.

## 2011-01-31 NOTE — ED Provider Notes (Addendum)
History     CSN: [id number] Arrival date & time: 01/31/2011  8:57 AM   First MD Initiated Contact with Patient 01/31/11 [phone]      Chief Complaint  Patient presents with  . Back Pain  . Insomnia    (Consider location/radiation/quality/duration/timing/severity/associated sxs/prior treatment) HPI This is a 40 year old male with PMH of chronic back pain, insomnia and hepatitis B who presents with worsening of her back pain last night. History is provided by patient. Patient states that he has chronic intermittent back pain and comes to the ED whenever his pain gets worse.  Last night he was resting and felt worsening of his back pain, bilateral lower back, moderate sharp pain, no radiation,  no incontinence of urine or bowel, no weakness, tingling or numbness. Movement make it worse and no alleviating factors.  Patient states he has been taking lyrica prescribed by his physician at [facility], and he ran out of his medications. Denies fever, chills or sore throat. Denies chest pain, chest pressure or palpitation. Denies nausea, vomiting or abdominal pain. No sick contact.  Past Medical History  Diagnosis Date  . Back pain, chronic     History reviewed. No pertinent past surgical history.  History reviewed. No pertinent family history.  History  Substance Use Topics  . Smoking status: Never Smoker   . Smokeless tobacco: Not on file  . Alcohol Use: Not on file      Review of Systems See HPI  Allergies  Review of patient's allergies indicates no known allergies.  Home Medications   Current Outpatient Rx  Name Route Sig Dispense Refill  . PREGABALIN 50 MG PO CAPS Oral Take 50 mg by mouth 3 (three) times daily.      Marland Kitchen ZOLPIDEM TARTRATE 5 MG PO TABS Oral Take 5 mg by mouth at bedtime.        BP 176/111  Pulse 107  Temp(Src) 97.3 F (36.3 C) (Oral)  Resp 20  SpO2 95%  Physical Exam General: alert, well-developed, and cooperative to examination. Speaks  [place], able to communicate in [place]. Head: normocephalic and atraumatic.  Eyes: vision grossly intact, pupils equal, pupils round, pupils reactive to light, no injection and anicteric.  Mouth: pharynx pink and moist, no erythema, and no exudates.  Neck: supple, full ROM, no thyromegaly, no JVD, and no carotid bruits.  Lungs: normal respiratory effort, no accessory muscle use, normal breath sounds, no crackles, and no wheezes. Heart: normal rate, regular rhythm, no murmur, no gallop, and no rub.  Abdomen: soft, non-tender, normal bowel sounds, no distention, no guarding, no rebound tenderness, no hepatomegaly, and no splenomegaly.  Msk: no joint swelling, no joint warmth, and no redness over joints.  Bilateral Paraspinal muscles tenderness noted.  Pulses: 2+ DP/PT pulses bilaterally Extremities: No cyanosis, clubbing, edema Neurologic: alert & oriented X3, cranial nerves II-XII intact, strength normal in all extremities, sensation intact to light touch, and gait normal.  Skin: turgor normal and no rashes.  Psych: Oriented X3, memory intact for recent and remote, normally interactive, good eye contact, not anxious appearing, and not depressed appearing.  ED Course  Procedures (including critical care time)  Date: 01/31/2011  Rate:81  Rhythm: normal sinus rhythm  QRS Axis: normal  Intervals: normal  ST/T Wave abnormalities: None  Conduction Disutrbances:none  Narrative Interpretation:   Old EKG Reviewed: No changes  Results for orders placed during the hospital encounter of 01/31/11  D-DIMER, QUANTITATIVE      Component Value Range   D-Dimer,  Quant <0.22  0.00 - 0.48 (ug/mL-FEU)  POCT I-STAT TROPONIN I      Component Value Range   Troponin i, poc 0.00  0.00 - 0.08 (ng/mL)   Comment 3           POCT I-STAT, CHEM 8      Component Value Range   Sodium 139  135 - 145 (mEq/L)   Potassium 4.5  3.5 - 5.1 (mEq/L)   Chloride 104  96 - 112 (mEq/L)   BUN 11  6 - 23 (mg/dL)    Creatinine, Ser 4.09  0.50 - 1.35 (mg/dL)   Glucose, Bld 811 (*) 70 - 99 (mg/dL)   Calcium, Ion 9.14  7.82 - 1.32 (mmol/L)   TCO2 28  0 - 100 (mmol/L)   Hemoglobin 17.7 (*) 13.0 - 17.0 (g/dL)   HCT 95.6  21.3 - 08.6 (%)   Dg Chest 2 View  01/31/2011  *RADIOLOGY REPORT*  Clinical Data: Back pain, hypertension, cough for several months, smoking history  CHEST - 2 VIEW  Comparison: Portable chest x-ray of 11/05/2010  Findings: No active infiltrate or effusion is seen.  Mild peribronchial thickening is noted.  The heart is within upper limits of normal.  No acute bony abnormality is seen.  Surgical clips are present in the right upper quadrant from prior cholecystectomy.  IMPRESSION: No pneumonia.  Mild peribronchial thickening.  Original Report Authenticated By: Juline Patch, M.D.   Dg Thoracic Spine 2 View  01/19/2011  *RADIOLOGY REPORT*  Clinical Data: No injury, woke up with pain in the back today  THORACIC SPINE - 2 VIEW  Comparison: CT chest of 10/28/2010  Findings: The thoracic vertebrae remain somewhat straightened in alignment.  Intervertebral disc spaces appear normal.  No compression deformity is seen.  No paravertebral soft tissue swelling is noted.  IMPRESSION: No acute abnormality.  Original Report Authenticated By: Juline Patch, M.D.   Dg Lumbar Spine Complete  01/19/2011  *RADIOLOGY REPORT*  Clinical Data: No injury, workup with pain and back today  LUMBAR SPINE - COMPLETE 4+ VIEW  Comparison: CT abdomen pelvis of 02/24/2010  Findings: The lumbar vertebrae are straightened in alignment. Intervertebral disc spaces appear normal.  No compression deformity is seen.  On oblique views the facet joints are unremarkable.  The SI joints appear well corticated.  IMPRESSION: There is straightened alignment.  No acute abnormality.  Original Report Authenticated By: Juline Patch, M.D.       MDM  10:04 AM I have called pt's Pharmacy at [facility] at [address]. [phone]). And the  following information was given.  Prilosec 20 mg po daily filled on 12/19/10 Ambien # 15 tablet filled on 01/21/11. Norvasc 5 mg po daily and lisinopril 10 mg po daily filled on 12/19/10. Lyrica 50 mg pills #90 prescribed by [facility] and  filled on 01/19/11 . His original Lyrica prescription was cancelled by his PCP from [facility].  Pt states that he let his wife take his lyrica and he ran out of all Lyrica. And he reports that he takes his Norvasc and Lisinopril daily and been off his BP medications for a month.  Given his history and highly possible medical noncompliance,  - will check his EKG, CXR, Troponin, D-dimer and I-stat. - will give Toradol 30 mg IV once for his back pain.        Dede Query, MD 01/31/11 1015  Dede Query, MD 01/31/11 1016  Dede Query, MD 01/31/11 1037  Dede Query, MD 01/31/11 1127  Dede Query, MD 01/31/11 1216

## 2011-01-31 NOTE — ED Notes (Signed)
Attempting to administer medication.  Pt not in stretcher x 1.

## 2011-02-04 ENCOUNTER — Emergency Department (HOSPITAL_COMMUNITY)
Admission: EM | Admit: 2011-02-04 | Discharge: 2011-02-04 | Discharge status: Against Medical Advice | Payer: Medicaid Other | Attending: Emergency Medicine | Admitting: Emergency Medicine

## 2011-02-04 ENCOUNTER — Encounter (HOSPITAL_COMMUNITY): Payer: Self-pay

## 2011-02-04 ENCOUNTER — Emergency Department (HOSPITAL_COMMUNITY)
Admission: EM | Admit: 2011-02-04 | Discharge: 2011-02-04 | Disposition: A | Discharge status: Home / Self Care | Payer: Medicaid Other | Source: Home / Self Care | Attending: Emergency Medicine | Admitting: Emergency Medicine

## 2011-02-04 ENCOUNTER — Encounter (HOSPITAL_COMMUNITY): Payer: Self-pay | Admitting: *Deleted

## 2011-02-04 DIAGNOSIS — R51 Headache: Secondary | ICD-10-CM | POA: Insufficient documentation

## 2011-02-04 DIAGNOSIS — G8929 Other chronic pain: Secondary | ICD-10-CM | POA: Insufficient documentation

## 2011-02-04 DIAGNOSIS — F17200 Nicotine dependence, unspecified, uncomplicated: Secondary | ICD-10-CM | POA: Insufficient documentation

## 2011-02-04 DIAGNOSIS — Z9114 Patient's other noncompliance with medication regimen: Secondary | ICD-10-CM

## 2011-02-04 DIAGNOSIS — M549 Dorsalgia, unspecified: Secondary | ICD-10-CM | POA: Insufficient documentation

## 2011-02-04 DIAGNOSIS — R42 Dizziness and giddiness: Secondary | ICD-10-CM | POA: Insufficient documentation

## 2011-02-04 DIAGNOSIS — Z9119 Patient's noncompliance with other medical treatment and regimen: Secondary | ICD-10-CM | POA: Insufficient documentation

## 2011-02-04 DIAGNOSIS — E119 Type 2 diabetes mellitus without complications: Secondary | ICD-10-CM | POA: Insufficient documentation

## 2011-02-04 DIAGNOSIS — Z91199 Patient's noncompliance with other medical treatment and regimen due to unspecified reason: Secondary | ICD-10-CM | POA: Insufficient documentation

## 2011-02-04 HISTORY — DX: Essential (primary) hypertension: I10

## 2011-02-04 LAB — URINALYSIS, ROUTINE W REFLEX MICROSCOPIC
Bilirubin Urine: NEGATIVE
Hgb urine dipstick: NEGATIVE
Nitrite: NEGATIVE
Specific Gravity, Urine: 1.019 (ref 1.005–1.030)
Urobilinogen, UA: 1 mg/dL (ref 0.0–1.0)
pH: 6.5 (ref 5.0–8.0)

## 2011-02-04 MED ORDER — PREGABALIN 50 MG PO CAPS: 50.0000 mg | ORAL_CAPSULE | Freq: Three times a day (TID) | ORAL | Status: DC

## 2011-02-04 MED ORDER — PREGABALIN 50 MG PO CAPS
50.0000 mg | ORAL_CAPSULE | ORAL | Status: DC
  Filled 2011-02-04 (×2): qty 1

## 2011-02-04 MED ORDER — AMLODIPINE BESYLATE 10 MG PO TABS: 5.0000 mg | ORAL_TABLET | Freq: Every day | ORAL | Status: DC

## 2011-02-04 MED ORDER — KETOROLAC TROMETHAMINE 60 MG/2ML IM SOLN
60.0000 mg | Freq: Once | INTRAMUSCULAR | Status: AC
  Administered 2011-02-04: 60 mg via INTRAMUSCULAR
  Filled 2011-02-04: qty 2

## 2011-02-04 MED ORDER — OXYCODONE-ACETAMINOPHEN 5-325 MG PO TABS
1.0000 | ORAL_TABLET | Freq: Once | ORAL | Status: DC
  Filled 2011-02-04: qty 1

## 2011-02-04 NOTE — ED Notes (Signed)
Attempted to discharge pt. Pt not in stretcher 4. Will continue to look for pt in wait and triage for medications and discharge instructions.

## 2011-02-04 NOTE — ED Notes (Signed)
Returned Microbiologist to [employer] and lyrica to pharmacy, pt not in stretcher 4 nor lobby, wait, or triage x 3 attempts

## 2011-02-04 NOTE — ED Notes (Signed)
Patient here with c/o low back pain for four years.  Patient is ambulatory at triage.

## 2011-02-04 NOTE — ED Notes (Signed)
Pt still not in stretcher 4. Attempted to look in lobby wait and triage with no success.

## 2011-02-04 NOTE — ED Provider Notes (Signed)
I did not evaluate this pt and was not involved in his care   Lyanne Co, MD 02/04/11 2238

## 2011-02-04 NOTE — ED Provider Notes (Signed)
History     CSN: [id number] Arrival date & time: 02/04/2011  8:56 AM   First MD Initiated Contact with Patient 02/04/11 0911      Chief Complaint  Patient presents with  . Back Pain    pt staters back pain since yesterday denies recent injury states pain is mid lower back denies difficulty urinating states pain radiates to the back of neck at times states difficulty sleeping    (Consider location/radiation/quality/duration/timing/severity/associated sxs/prior treatment) HPI   Past Medical History  Diagnosis Date  . Back pain, chronic   Patient here with back pain for 3-4 years.  Patient states pain worse.  Patient states out of lyrica.  Denies numbness, tingling, weakness, or difficulty urinating.  No new injury.  Patient has some headache.  He is followed at [facility].    History reviewed. No pertinent past surgical history.  History reviewed. No pertinent family history.  History  Substance Use Topics  . Smoking status: Current Everyday Smoker  . Smokeless tobacco: Not on file  . Alcohol Use: No      Review of Systems  All other systems reviewed and are negative.    Allergies  Review of patient's allergies indicates no known allergies.  Home Medications   Current Outpatient Rx  Name Route Sig Dispense Refill  . AMLODIPINE BESYLATE 5 MG PO TABS Oral Take 1 tablet (5 mg total) by mouth daily. 30 tablet 1  . LISINOPRIL 10 MG PO TABS Oral Take 1 tablet (10 mg total) by mouth daily. 30 tablet 0  . PREGABALIN 50 MG PO CAPS Oral Take 50 mg by mouth 3 (three) times daily.      Marland Kitchen ZOLPIDEM TARTRATE 5 MG PO TABS Oral Take 5 mg by mouth at bedtime.        BP 150/104  Pulse 96  Temp(Src) 97.8 F (36.6 C) (Oral)  Resp 16  SpO2 98%  Physical Exam  Nursing note and vitals reviewed. Constitutional: He is oriented to person, place, and time. He appears well-developed and well-nourished.  HENT:  Head: Normocephalic and atraumatic.  Eyes: Conjunctivae and EOM are  normal. Pupils are equal, round, and reactive to light.  Neck: Normal range of motion. Neck supple.  Cardiovascular: Normal rate and regular rhythm.   Pulmonary/Chest: Effort normal and breath sounds normal.  Abdominal: Soft. Bowel sounds are normal.  Musculoskeletal: Normal range of motion.  Neurological: He is alert and oriented to person, place, and time. He has normal reflexes.  Skin: Skin is warm and dry.  Psychiatric: He has a normal mood and affect. His behavior is normal. Judgment and thought content normal.    ED Course  Procedures (including critical care time)  Labs Reviewed - No data to display No results found.   No diagnosis found.    MDM  Patient's history obtained through interpretor phone.  Patient examined and care plan discussed via interpreter.  [facility].  Advised to stop smoking.  10 days of norvasc and lyrica prescribed to refill meds patient states he is out of.        Hilario Quarry, MD 02/04/11 [phone]

## 2011-02-05 ENCOUNTER — Encounter (HOSPITAL_COMMUNITY): Payer: Self-pay

## 2011-02-05 ENCOUNTER — Emergency Department (HOSPITAL_COMMUNITY)
Admission: EM | Admit: 2011-02-05 | Discharge: 2011-02-05 | Disposition: A | Discharge status: Home / Self Care | Payer: Medicaid Other | Attending: Emergency Medicine | Admitting: Emergency Medicine

## 2011-02-05 DIAGNOSIS — G8929 Other chronic pain: Secondary | ICD-10-CM

## 2011-02-05 DIAGNOSIS — I10 Essential (primary) hypertension: Secondary | ICD-10-CM | POA: Insufficient documentation

## 2011-02-05 DIAGNOSIS — M545 Low back pain, unspecified: Secondary | ICD-10-CM | POA: Insufficient documentation

## 2011-02-05 DIAGNOSIS — Z79899 Other long term (current) drug therapy: Secondary | ICD-10-CM | POA: Insufficient documentation

## 2011-02-05 MED ORDER — KETOROLAC TROMETHAMINE 60 MG/2ML IM SOLN
60.0000 mg | Freq: Once | INTRAMUSCULAR | Status: AC
  Administered 2011-02-05: 60 mg via INTRAMUSCULAR
  Filled 2011-02-05: qty 2

## 2011-02-05 NOTE — ED Notes (Signed)
Patient is resting comfortably. Pt offered coffee

## 2011-02-05 NOTE — ED Notes (Signed)
Pt. verbalized understand of instructions

## 2011-02-05 NOTE — ED Provider Notes (Signed)
History     CSN: [id number] Arrival date & time: 02/05/2011  9:09 AM   First MD Initiated Contact with Patient 02/05/11 [phone]      Chief Complaint  Patient presents with  . Back Pain    cannot sleep    (Consider location/radiation/quality/duration/timing/severity/associated sxs/prior treatment) HPI Comments: Patient noted to have been seen yesterday at [facility] for the same thing, has long standing history of similar pain complaints, is non-compliant with follow ups.  Was supposed to see [facility] PCP this week, they have stopped writing for Lyrica for the patient and the patient has admitted to giving the medication to his wife as well.  Patient is a 40 y.o. male presenting with back pain. The history is provided by the patient. No language interpreter was used.  Back Pain  This is a chronic problem. The current episode started more than 1 week ago. The problem occurs constantly. The problem has not changed since onset.The pain is associated with no known injury. The pain is present in the lumbar spine. The quality of the pain is described as stabbing and shooting. The pain does not radiate. The pain is at a severity of 10/10. The pain is severe. The symptoms are aggravated by bending and twisting. The pain is the same all the time. Pertinent negatives include no numbness, no bowel incontinence, no perianal numbness, no bladder incontinence, no leg pain, no tingling and no weakness. He has tried nothing for the symptoms. The treatment provided no relief.    Past Medical History  Diagnosis Date  . Back pain, chronic   . Hypertension     History reviewed. No pertinent past surgical history.  History reviewed. No pertinent family history.  History  Substance Use Topics  . Smoking status: Current Everyday Smoker  . Smokeless tobacco: Not on file  . Alcohol Use: No      Review of Systems  Gastrointestinal: Negative for bowel incontinence.  Genitourinary: Negative for  bladder incontinence.  Musculoskeletal: Positive for back pain.  Neurological: Negative for tingling, weakness and numbness.  All other systems reviewed and are negative.    Allergies  Review of patient's allergies indicates no known allergies.  Home Medications   Current Outpatient Rx  Name Route Sig Dispense Refill  . AMLODIPINE BESYLATE 10 MG PO TABS Oral Take 0.5 tablets (5 mg total) by mouth daily. 10 tablet 0  . LISINOPRIL 10 MG PO TABS Oral Take 1 tablet (10 mg total) by mouth daily. 30 tablet 0  . OMEPRAZOLE 20 MG PO CPDR Oral Take 20 mg by mouth daily.      Marland Kitchen PREGABALIN 50 MG PO CAPS Oral Take 50 mg by mouth 3 (three) times daily.      Marland Kitchen ZOLPIDEM TARTRATE 5 MG PO TABS Oral Take 5 mg by mouth at bedtime.        BP 141/101  Pulse 71  Temp 97.8 F (36.6 C)  Resp 18  Ht 5\' 7"  (1.702 m)  Wt 200 lb (90.719 kg)  BMI 31.32 kg/m2  SpO2 97%  Physical Exam  Nursing note and vitals reviewed. Constitutional: He is oriented to person, place, and time. He appears well-developed and well-nourished.  HENT:  Head: Normocephalic and atraumatic.  Mouth/Throat: Oropharynx is clear and moist.  Eyes: Pupils are equal, round, and reactive to light.  Neck: Normal range of motion. Neck supple.  Cardiovascular: Normal rate, regular rhythm, normal heart sounds and intact distal pulses.   Pulmonary/Chest: Effort normal and  breath sounds normal.  Abdominal: Soft. Bowel sounds are normal. There is no tenderness.  Musculoskeletal:       Lumbar back: He exhibits tenderness and pain. He exhibits normal range of motion, no swelling and no edema.  Neurological: He is alert and oriented to person, place, and time.  Skin: Skin is warm and dry.  Psychiatric: He has a normal mood and affect. His behavior is normal. Judgment and thought content normal.    ED Course  Procedures (including critical care time)  Labs Reviewed - No data to display No results found.   Chronic lower  backpain   MDM  This patient is here with persistent chronic lower back pain without identifyable pathology.  He is non-compliant with medication and follow up in the past.  I will not refill his lyrica as he just obtained a refill yesterday from Dr. Rosalia Hammers, there are no concerning symptoms to suggest cauda equina, epidural hematoma or abscess.        Izola Price [place], [place] 02/05/11 1119

## 2011-02-05 NOTE — ED Provider Notes (Signed)
Medical screening examination/treatment/procedure(s) were performed by non-physician practitioner and as supervising physician I was immediately available for consultation/collaboration.  Trayven Lumadue M Nettie Cromwell, MD 02/05/11 1846 

## 2011-02-05 NOTE — ED Notes (Signed)
Waiting for MD evaluation.  Pt offered a coke at this time.

## 2011-02-05 NOTE — ED Notes (Signed)
Patient having back pain and can't sleep.  No injury.

## 2011-02-05 NOTE — ED Notes (Signed)
Pt. Is uuable to sleep having lower back pain

## 2011-02-06 ENCOUNTER — Emergency Department (HOSPITAL_COMMUNITY)
Admission: EM | Admit: 2011-02-06 | Discharge: 2011-02-06 | Disposition: A | Discharge status: Home / Self Care | Payer: Medicaid Other | Attending: Emergency Medicine | Admitting: Emergency Medicine

## 2011-02-06 ENCOUNTER — Encounter (HOSPITAL_COMMUNITY): Payer: Self-pay | Admitting: *Deleted

## 2011-02-06 DIAGNOSIS — M549 Dorsalgia, unspecified: Secondary | ICD-10-CM | POA: Insufficient documentation

## 2011-02-06 DIAGNOSIS — Z79899 Other long term (current) drug therapy: Secondary | ICD-10-CM | POA: Insufficient documentation

## 2011-02-06 DIAGNOSIS — I10 Essential (primary) hypertension: Secondary | ICD-10-CM | POA: Insufficient documentation

## 2011-02-06 DIAGNOSIS — G8929 Other chronic pain: Secondary | ICD-10-CM | POA: Insufficient documentation

## 2011-02-06 LAB — URINALYSIS, ROUTINE W REFLEX MICROSCOPIC
Hgb urine dipstick: NEGATIVE
Leukocytes, UA: NEGATIVE
Protein, ur: NEGATIVE mg/dL
Specific Gravity, Urine: 1.01 (ref 1.005–1.030)
Urobilinogen, UA: 0.2 mg/dL (ref 0.0–1.0)

## 2011-02-06 MED ORDER — KETOROLAC TROMETHAMINE 60 MG/2ML IM SOLN
30.0000 mg | Freq: Once | INTRAMUSCULAR | Status: AC
  Administered 2011-02-06: 30 mg via INTRAMUSCULAR
  Filled 2011-02-06: qty 2

## 2011-02-06 NOTE — ED Notes (Signed)
Pt c/o back pain that began x's 4 years ago. Pt denies recent injury. Pt ambulatory without difficulty. No bruising or non-intact skin noted.

## 2011-02-06 NOTE — ED Notes (Signed)
Pt states that he began feeling pain in his back last night at approximately 8:00pm  In his back. Upon palpation he states that his back hurts all over. Patient states pain is 10/10 at this moment. Pt is unable to describe what his pain feels like. Pt states that he has felt this pain in his back before but that he is unable to describe when. Pt also states that he is having a pain in his head that is 2/10. Patient states that it is a throbbing pain. Patient has taken ibuprofen for his pain which he states has not helped any. Patient also has a productive cough that he states he has had for 4 years. Pt states that his mucous is grey.

## 2011-02-06 NOTE — ED Provider Notes (Signed)
History     CSN: [id number] Arrival date & time: 02/06/2011 11:08 AM   First MD Initiated Contact with Patient 02/06/11 1221      Chief Complaint  Patient presents with  . Back Pain    chronic    (Consider location/radiation/quality/duration/timing/severity/associated sxs/prior treatment) HPI History provided by pt and prior chart.  Pt reports pain in left lower back for the past 4 years.  Radiates down both legs. No recent trauma of change in characteristics of pain.  Ambulatory and denies fever, bladder/bowel dysfunction and lower extremity weakness/parasthesias.  Has had dysuria since yesterday.  Per prior chart, has been seen for same 5 times in the past month.  Has been non-compliant w/ follow up.  Pt reports that he scheduled an appt w/ [facility] yesterday for next month.   Past Medical History  Diagnosis Date  . Back pain, chronic   . Hypertension     History reviewed. No pertinent past surgical history.  History reviewed. No pertinent family history.  History  Substance Use Topics  . Smoking status: Current Everyday Smoker  . Smokeless tobacco: Not on file  . Alcohol Use: No      Review of Systems  All other systems reviewed and are negative.    Allergies  Review of patient's allergies indicates no known allergies.  Home Medications   Current Outpatient Rx  Name Route Sig Dispense Refill  . AMLODIPINE BESYLATE 10 MG PO TABS Oral Take 0.5 tablets (5 mg total) by mouth daily. 10 tablet 0  . IBUPROFEN 200 MG PO TABS Oral Take 200 mg by mouth every 6 (six) hours as needed. For pain     . LISINOPRIL 10 MG PO TABS Oral Take 1 tablet (10 mg total) by mouth daily. 30 tablet 0  . THERA M PLUS PO TABS Oral Take 1 tablet by mouth daily.      Marland Kitchen OMEPRAZOLE 20 MG PO CPDR Oral Take 20 mg by mouth daily.      Marland Kitchen PREGABALIN 50 MG PO CAPS Oral Take 50 mg by mouth 3 (three) times daily.      Marland Kitchen ZOLPIDEM TARTRATE 5 MG PO TABS Oral Take 5 mg by mouth at bedtime.         BP 157/109  Pulse 84  Temp(Src) 98 F (36.7 C) (Oral)  Resp 18  SpO2 98%  Physical Exam  Nursing note and vitals reviewed. Constitutional: He is oriented to person, place, and time. He appears well-developed and well-nourished.  HENT:  Head: Normocephalic and atraumatic.  Eyes:       Normal appearance  Neck: Normal range of motion.  Cardiovascular: Normal rate and regular rhythm.   Pulmonary/Chest: Effort normal and breath sounds normal.  Genitourinary:       L CVA ttp  Musculoskeletal:       Diffuse lower back ttp.  Full active ROM of LE.  Nml patellar reflexes.  No saddle anesthesia. Distal sensation intact.  2+ DP pulses.  Ambulates w/out diffulty.   Neurological: He is alert and oriented to person, place, and time.  Skin: Skin is warm and dry. No rash noted.  Psychiatric: He has a normal mood and affect. His behavior is normal.    ED Course  Procedures (including critical care time)   Labs Reviewed  URINALYSIS, ROUTINE W REFLEX MICROSCOPIC   No results found.   1. Chronic back pain       MDM  Pt presents for 6th time this month w/  chronic low back pain. Ambulatory and no red flag s/sx.  U/A ordered d/t c/o dysuria and left CVA ttp but neg for infection.  Pt received 30mg  IM toradol which relieved pain.  He requested discharge.          Otilio Miu, [place] 02/06/11 1945

## 2011-02-07 ENCOUNTER — Emergency Department (HOSPITAL_COMMUNITY)
Admission: EM | Admit: 2011-02-07 | Discharge: 2011-02-07 | Disposition: A | Discharge status: Home / Self Care | Payer: Medicaid Other | Attending: Emergency Medicine | Admitting: Emergency Medicine

## 2011-02-07 ENCOUNTER — Encounter (HOSPITAL_COMMUNITY): Payer: Self-pay | Admitting: Emergency Medicine

## 2011-02-07 DIAGNOSIS — M545 Low back pain, unspecified: Secondary | ICD-10-CM | POA: Insufficient documentation

## 2011-02-07 DIAGNOSIS — R51 Headache: Secondary | ICD-10-CM | POA: Insufficient documentation

## 2011-02-07 DIAGNOSIS — G8929 Other chronic pain: Secondary | ICD-10-CM | POA: Diagnosis present

## 2011-02-07 DIAGNOSIS — G4700 Insomnia, unspecified: Secondary | ICD-10-CM | POA: Insufficient documentation

## 2011-02-07 DIAGNOSIS — I10 Essential (primary) hypertension: Secondary | ICD-10-CM | POA: Insufficient documentation

## 2011-02-07 DIAGNOSIS — M549 Dorsalgia, unspecified: Secondary | ICD-10-CM

## 2011-02-07 DIAGNOSIS — M79609 Pain in unspecified limb: Secondary | ICD-10-CM | POA: Insufficient documentation

## 2011-02-07 MED ORDER — IBUPROFEN 800 MG PO TABS
800.0000 mg | ORAL_TABLET | Freq: Once | ORAL | Status: AC
Start: 2011-02-07 — End: 2011-02-07
  Administered 2011-02-07: 800 mg via ORAL
  Filled 2011-02-07: qty 1

## 2011-02-07 MED ORDER — NICOTINE 14 MG/24HR TD PT24
14.0000 mg | MEDICATED_PATCH | TRANSDERMAL | Status: DC
  Filled 2011-02-07: qty 1

## 2011-02-07 NOTE — ED Notes (Signed)
Pt. Discharged to home.   Pt. Stated, "I will call my doctor".

## 2011-02-07 NOTE — Progress Notes (Signed)
Spoke with Irving Burton PA about patient and his behaviors with medications and frequency of visits. Advised of previous appts I scheduled for patient with [facility] and the education patient has had about taking meds correctly. Irving Burton agreed with concerns over behavioral problems. After she speaks with [facility], she will contact me if further assistance is warranted.

## 2011-02-07 NOTE — ED Notes (Signed)
Patient continues to walk in and out of the department.  Encouraged to remain in the ED and he refuses.  RN made aware

## 2011-02-07 NOTE — ED Notes (Signed)
Back pain  X 4 years  And has a h/a did not sleep good last night

## 2011-02-07 NOTE — ED Provider Notes (Signed)
Medical screening examination/treatment/procedure(s) were performed by non-physician practitioner and as supervising physician I was immediately available for consultation/collaboration.   Persephone Schriever M Ludy Messamore, DO 02/07/11 1928 

## 2011-02-07 NOTE — ED Provider Notes (Signed)
History     CSN: [id number] Arrival date & time: 02/07/2011  8:30 AM   First MD Initiated Contact with Patient 02/07/11 0912      Chief Complaint  Patient presents with  . Back Pain  . Headache    (Consider location/radiation/quality/duration/timing/severity/associated sxs/prior treatment) HPI Comments: I spoke with patient through [employer].  Patient returns to the ER today for his 4th visit in 4 days.  Pt reports chronic back pain x 4 years and insomnia x 4-5 months.  The back pain is in his lower back and radiates into his left leg.  This is completely unchanged from his chronic pain.  The insomnia is explained as feeling sleepy but not sleeping, states he sleeps 1 hour/night.  States he has felt depressed for about 1 year.  Does not think about hurting himself.  States "I think I'm crazy" because I don't sleep.  Denies anxiety or nervousness, denies hearing or seeing things other people don't see or hear.  Patient is a 40 y.o. male presenting with back pain and headaches. The history is provided by the patient. The history is limited by a language barrier. A language interpreter was used.  Back Pain  Associated symptoms include headaches.  Headache     Past Medical History  Diagnosis Date  . Back pain, chronic   . Hypertension     History reviewed. No pertinent past surgical history.  No family history on file.  History  Substance Use Topics  . Smoking status: Current Everyday Smoker  . Smokeless tobacco: Not on file  . Alcohol Use: No      Review of Systems  Unable to perform ROS Musculoskeletal: Positive for back pain.  Neurological: Positive for headaches.    Allergies  Review of patient's allergies indicates no known allergies.  Home Medications   Current Outpatient Rx  Name Route Sig Dispense Refill  . AMLODIPINE BESYLATE 10 MG PO TABS Oral Take 0.5 tablets (5 mg total) by mouth daily. 10 tablet 0  . IBUPROFEN 200 MG PO TABS Oral Take 200  mg by mouth every 6 (six) hours as needed. For pain     . LISINOPRIL 10 MG PO TABS Oral Take 1 tablet (10 mg total) by mouth daily. 30 tablet 0    BP 166/109  Pulse 98  Temp(Src) 98.4 F (36.9 C) (Oral)  Resp 22  SpO2 97%  Physical Exam  Constitutional: He is oriented to person, place, and time. He appears well-developed and well-nourished.  HENT:  Head: Normocephalic and atraumatic.  Neck: Normal range of motion. Neck supple.  Pulmonary/Chest: Effort normal.  Musculoskeletal: Normal range of motion.       Normal gait  Neurological: He is alert and oriented to person, place, and time.    ED Course  Procedures (including critical care time) 10:28 AM Spoke with Dewayne Hatch, Case manager, who states she has seen patient in the past and has arranged follow up with [facility] and has asked [facility] to speak with him misusing his medications.    11:16 AM Did not receive call from [facility] but nurse states ACT team member has told her she has seen the patient before.    Patient moved to yellow to await consult from [facility] - patient decided he preferred to go home.  States he does not want to stay to talk to someone about his insomnia.  States he will follow up with [facility].  Labs Reviewed - No data to display No  results found.   1. Chronic back pain   2. Insomnia       MDM  Patient with chronic back pain and insomnia, multiple visits to ED for same, symptoms unchanged.  Attempted to help with resources and [facility] team but patient decided he would prefer to go home.  Denied SI.          Rise Patience, [place] 02/07/11 1555

## 2011-02-08 ENCOUNTER — Emergency Department (HOSPITAL_COMMUNITY)
Admission: EM | Admit: 2011-02-08 | Discharge: 2011-02-08 | Disposition: A | Discharge status: Home / Self Care | Payer: Medicaid Other | Attending: Emergency Medicine | Admitting: Emergency Medicine

## 2011-02-08 ENCOUNTER — Encounter (HOSPITAL_COMMUNITY): Payer: Self-pay | Admitting: Emergency Medicine

## 2011-02-08 DIAGNOSIS — M549 Dorsalgia, unspecified: Secondary | ICD-10-CM | POA: Insufficient documentation

## 2011-02-08 DIAGNOSIS — G8929 Other chronic pain: Secondary | ICD-10-CM | POA: Insufficient documentation

## 2011-02-08 DIAGNOSIS — Z79899 Other long term (current) drug therapy: Secondary | ICD-10-CM | POA: Insufficient documentation

## 2011-02-08 DIAGNOSIS — I10 Essential (primary) hypertension: Secondary | ICD-10-CM | POA: Insufficient documentation

## 2011-02-08 DIAGNOSIS — F17200 Nicotine dependence, unspecified, uncomplicated: Secondary | ICD-10-CM | POA: Insufficient documentation

## 2011-02-08 MED ORDER — IBUPROFEN 800 MG PO TABS
800.0000 mg | ORAL_TABLET | Freq: Once | ORAL | Status: AC
  Administered 2011-02-08: 800 mg via ORAL
  Filled 2011-02-08: qty 1

## 2011-02-08 MED ORDER — PREGABALIN 50 MG PO CAPS: 50.0000 mg | ORAL_CAPSULE | Freq: Three times a day (TID) | ORAL | Status: DC

## 2011-02-08 NOTE — ED Notes (Signed)
Pt reports a 4 year hx of back pain. Denies trauma. Requesting refill of lyrica

## 2011-02-08 NOTE — ED Provider Notes (Signed)
History     CSN: [id number] Arrival date & time: 02/08/2011  9:49 AM   First MD Initiated Contact with Patient 02/08/11 1108      Chief Complaint  Patient presents with  . Back Pain    (Consider location/radiation/quality/duration/timing/severity/associated sxs/prior treatment) Patient is a 40 y.o. male presenting with back pain. No language interpreter was used.  Back Pain  This is a chronic problem. The current episode started more than 1 week ago. The problem occurs constantly. The problem has been gradually worsening. The quality of the pain is described as aching. The pain does not radiate. The pain is at a severity of 5/10. The pain is moderate. The symptoms are aggravated by twisting and bending. Associated symptoms include bladder incontinence. Pertinent negatives include no fever, no numbness, no abdominal pain, no bowel incontinence, no perianal numbness, no dysuria, no pelvic pain, no leg pain, no paresthesias, no paresis, no tingling and no weakness. He has tried walking and muscle relaxants for the symptoms.   here today with a 4 year history of lower back pain. States that [facility] as he is on 50 mg alert today he is out of the medication. Requesting a refill for his Lyrica today. Denies radiating pain perineal numbness bowel or bladder problems. Good reflexes to bilateral knees. She was able to walk. Will refill his Lyrica and have him follow [facility] as his possible.  Past Medical History  Diagnosis Date  . Back pain, chronic   . Hypertension     History reviewed. No pertinent past surgical history.  History reviewed. No pertinent family history.  History  Substance Use Topics  . Smoking status: Current Everyday Smoker  . Smokeless tobacco: Not on file  . Alcohol Use: No      Review of Systems  Constitutional: Negative for fever.  Gastrointestinal: Negative for abdominal pain and bowel incontinence.  Genitourinary: Positive for bladder  incontinence. Negative for dysuria and pelvic pain.  Musculoskeletal: Positive for back pain.  Neurological: Negative for tingling, weakness, numbness and paresthesias.  All other systems reviewed and are negative.    Allergies  Review of patient's allergies indicates no known allergies.  Home Medications   Current Outpatient Rx  Name Route Sig Dispense Refill  . AMLODIPINE BESYLATE 10 MG PO TABS Oral Take 0.5 tablets (5 mg total) by mouth daily. 10 tablet 0  . IBUPROFEN 200 MG PO TABS Oral Take 200 mg by mouth every 6 (six) hours as needed. For pain     . LISINOPRIL 10 MG PO TABS Oral Take 1 tablet (10 mg total) by mouth daily. 30 tablet 0    BP 154/98  Pulse 93  Temp(Src) 97.9 F (36.6 C) (Oral)  Resp 24  SpO2 97%  Physical Exam  Constitutional: He is oriented to person, place, and time. He appears well-developed and well-nourished.  HENT:  Head: Normocephalic.  Eyes: Pupils are equal, round, and reactive to light.  Pulmonary/Chest: Effort normal.  Musculoskeletal: He exhibits no edema.  Neurological: He is alert and oriented to person, place, and time. No cranial nerve deficit.  Skin: Skin is warm and dry.  Psychiatric: He has a normal mood and affect.    ED Course  Procedures (including critical care time)  Labs Reviewed - No data to display No results found.   No diagnosis found.    MDM  Frequent flyer to the ER with lower back pain x4 years. Requesting a refill of his and  50 mg per day. No fever no radiating pain or perineal numbness good reflexes in bowel or bladder problems today. We'll followup with [facility] as [facility] possible.        Jethro Bastos, NP 02/08/11 [phone]

## 2011-02-09 ENCOUNTER — Emergency Department (HOSPITAL_COMMUNITY)
Admission: EM | Admit: 2011-02-09 | Discharge: 2011-02-09 | Disposition: A | Discharge status: Home / Self Care | Payer: Medicaid Other | Attending: Emergency Medicine | Admitting: Emergency Medicine

## 2011-02-09 ENCOUNTER — Encounter (HOSPITAL_COMMUNITY): Payer: Self-pay | Admitting: *Deleted

## 2011-02-09 DIAGNOSIS — R51 Headache: Secondary | ICD-10-CM | POA: Insufficient documentation

## 2011-02-09 DIAGNOSIS — M545 Low back pain, unspecified: Secondary | ICD-10-CM | POA: Insufficient documentation

## 2011-02-09 DIAGNOSIS — G4700 Insomnia, unspecified: Secondary | ICD-10-CM | POA: Insufficient documentation

## 2011-02-09 DIAGNOSIS — G8929 Other chronic pain: Secondary | ICD-10-CM

## 2011-02-09 DIAGNOSIS — I10 Essential (primary) hypertension: Secondary | ICD-10-CM

## 2011-02-09 MED ORDER — ACETAMINOPHEN 325 MG PO TABS
650.0000 mg | ORAL_TABLET | Freq: Once | ORAL | Status: AC
  Administered 2011-02-09: 650 mg via ORAL
  Filled 2011-02-09: qty 2

## 2011-02-09 NOTE — ED Provider Notes (Signed)
Medical screening examination/treatment/procedure(s) were performed by non-physician practitioner and as supervising physician I was immediately available for consultation/collaboration.  Jaskarn Schweer M Becka Lagasse, MD 02/09/11 1251 

## 2011-02-09 NOTE — ED Provider Notes (Signed)
Medical screening examination/treatment/procedure(s) were performed by non-physician practitioner and as supervising physician I was immediately available for consultation/collaboration.   Forbes Cellar, MD 02/09/11 [phone]

## 2011-02-09 NOTE — ED Provider Notes (Signed)
History     CSN: [id number] Arrival date & time: 02/09/2011  8:53 AM   First MD Initiated Contact with Patient 02/09/11 1016      No chief complaint on file.  chief complaint back headache, can't sleep Level V caveat language barrier history is obtained from [facility] interpreter using language line  (Consider location/radiation/quality/duration/timing/severity/associated sxs/prior treatment) HPI Complains of low back pain and headache for 4 years. Unchanged seen in the emergency department multiple times this week described Lyrica yesterday states he cannot get it filled as Medicaid will not fill his prescription. No other treatment prior to coming here nothing makes symptoms better or worse. States he's unable to sleep for several days. Past Medical History  Diagnosis Date  . Back pain, chronic   . Hypertension    diabetes History reviewed. No pertinent past surgical history.  History reviewed. No pertinent family history.  History  Substance Use Topics  . Smoking status: Current Everyday Smoker  . Smokeless tobacco: Not on file  . Alcohol Use: No      Review of Systems  Constitutional: Negative.   HENT: Negative.   Respiratory: Negative.   Cardiovascular: Negative.   Gastrointestinal: Negative.   Musculoskeletal: Positive for back pain.  Skin: Negative.   Neurological:       Headache  Hematological: Negative.   Psychiatric/Behavioral: Negative.     Allergies  Review of patient's allergies indicates no known allergies.  Home Medications   Current Outpatient Rx  Name Route Sig Dispense Refill  . AMLODIPINE BESYLATE 10 MG PO TABS Oral Take 0.5 tablets (5 mg total) by mouth daily. 10 tablet 0  . IBUPROFEN 200 MG PO TABS Oral Take 200 mg by mouth every 6 (six) hours as needed. For pain     . LISINOPRIL 10 MG PO TABS Oral Take 1 tablet (10 mg total) by mouth daily. 30 tablet 0  . PREGABALIN 50 MG PO CAPS Oral Take 1 capsule (50 mg total) by mouth 3 (three) times  daily. 90 capsule 0    BP 143/104  Pulse 106  Temp(Src) 98.3 F (36.8 C) (Oral)  Resp 12  SpO2 96%  Physical Exam  Nursing note and vitals reviewed. Constitutional: He appears well-developed and well-nourished.  HENT:  Head: Normocephalic and atraumatic.  Eyes: Conjunctivae are normal. Pupils are equal, round, and reactive to light.  Neck: Neck supple. No tracheal deviation present. No thyromegaly present.  Cardiovascular: Normal rate and regular rhythm.   No murmur heard. Pulmonary/Chest: Effort normal and breath sounds normal.  Abdominal: Soft. Bowel sounds are normal. He exhibits no distension. There is no tenderness.  Musculoskeletal: Normal range of motion. He exhibits no edema and no tenderness.  Neurological: He is alert. He displays normal reflexes. Coordination normal.       Gait normal  Skin: Skin is warm and dry. No rash noted.  Psychiatric: He has a normal mood and affect.    ED Course  Procedures (including critical care time)  Labs Reviewed - No data to display No results found.   No diagnosis found.    MDM  Symptoms chronic and unchanged. Plan Tylenol for pain, patient can use over-the-counter sleep medications. Followup PMD. Diagnosis #1 chronic pain #2 insomnia  #3 hypertension      Doug Sou, MD 02/09/11 1114

## 2011-02-10 ENCOUNTER — Encounter (HOSPITAL_COMMUNITY): Payer: Self-pay | Admitting: Emergency Medicine

## 2011-02-10 ENCOUNTER — Emergency Department (HOSPITAL_COMMUNITY)
Admission: EM | Admit: 2011-02-10 | Discharge: 2011-02-10 | Disposition: A | Discharge status: Home / Self Care | Payer: Medicaid Other | Attending: Emergency Medicine | Admitting: Emergency Medicine

## 2011-02-10 DIAGNOSIS — Z79899 Other long term (current) drug therapy: Secondary | ICD-10-CM | POA: Insufficient documentation

## 2011-02-10 DIAGNOSIS — G8929 Other chronic pain: Secondary | ICD-10-CM

## 2011-02-10 DIAGNOSIS — I10 Essential (primary) hypertension: Secondary | ICD-10-CM | POA: Insufficient documentation

## 2011-02-10 DIAGNOSIS — M545 Low back pain, unspecified: Secondary | ICD-10-CM | POA: Insufficient documentation

## 2011-02-10 MED ORDER — ACETAMINOPHEN-CODEINE #3 300-30 MG PO TABS
1.0000 | ORAL_TABLET | Freq: Once | ORAL | Status: AC
  Administered 2011-02-10: 1 via ORAL
  Filled 2011-02-10: qty 1

## 2011-02-10 MED ORDER — METHOCARBAMOL 500 MG PO TABS: 500.0000 mg | ORAL_TABLET | Freq: Four times a day (QID) | ORAL | Status: AC

## 2011-02-10 NOTE — ED Provider Notes (Signed)
Evaluation and management procedures were performed by the mid-level provider (PA/NP/CNM) under my supervision/collaboration. I was present and available during the ED course. Swayzee Wadley Y.   Gavin Pound. Hadiyah Maricle, MD 02/10/11 1315

## 2011-02-10 NOTE — ED Notes (Signed)
Pt reports recurrent low back pain

## 2011-02-10 NOTE — ED Provider Notes (Signed)
History     CSN: [id number] Arrival date & time: 02/10/2011 10:01 AM   None     Chief Complaint  Patient presents with  . Back Pain    (Consider location/radiation/quality/duration/timing/severity/associated sxs/prior treatment) HPI  Patient has been seen in emergency department numerous times with complaint of chronic back pain he was seen in ER yesterday with noted discussion about having patient follow up with primary care doctor and/or pain management for further evaluation and management of back pain returns to emergency department today complaining of ongoing lower back pain. History was provided by the patient without translator use. Patient verbalizes his understanding of discussion. Patient states that his back pain has been persistent for 4 years and aggravated by movement. Denies any changes or worsening of symptoms just complaining of ongoing back pain. Denies radiation of pain into legs. Denies abdominal pain. Denies extremity numbness/tingling/weakness. Denies difficulty ambulating. Denies loss of bowel or bladder function or saddle seat paresthesias. Patient states over-the-counter pain medicine is not relieving pain. Pain aggravated by movement. Patient had x-ray of thoracic and lumbar spine at the beginning of November without acute findings.  Past Medical History  Diagnosis Date  . Back pain, chronic   . Hypertension     History reviewed. No pertinent past surgical history.  History reviewed. No pertinent family history.  History  Substance Use Topics  . Smoking status: Current Everyday Smoker  . Smokeless tobacco: Not on file  . Alcohol Use: No      Review of Systems  All other systems reviewed and are negative.    Allergies  Review of patient's allergies indicates no known allergies.  Home Medications   Current Outpatient Rx  Name Route Sig Dispense Refill  . AMLODIPINE BESYLATE 10 MG PO TABS Oral Take 0.5 tablets (5 mg total) by mouth daily. 10  tablet 0  . IBUPROFEN 200 MG PO TABS Oral Take 200 mg by mouth every 6 (six) hours as needed. For pain     . LISINOPRIL 10 MG PO TABS Oral Take 1 tablet (10 mg total) by mouth daily. 30 tablet 0  . PREGABALIN 50 MG PO CAPS Oral Take 1 capsule (50 mg total) by mouth 3 (three) times daily. 90 capsule 0    BP 132/90  Pulse 97  Temp(Src) 97.5 F (36.4 C) (Oral)  Resp 18  SpO2 99%  Physical Exam  Nursing note and vitals reviewed. Constitutional: He is oriented to person, place, and time. He appears well-developed and well-nourished. No distress.  HENT:  Head: Normocephalic and atraumatic.  Eyes: Conjunctivae are normal.  Neck: Normal range of motion. Neck supple.  Cardiovascular: Normal rate, regular rhythm, normal heart sounds and intact distal pulses.  Exam reveals no gallop and no friction rub.   No murmur heard. Pulmonary/Chest: Effort normal and breath sounds normal. No respiratory distress. He has no wheezes. He has no rales. He exhibits no tenderness.  Abdominal: Bowel sounds are normal. He exhibits no distension and no mass. There is no tenderness. There is no rebound and no guarding.  Musculoskeletal: Normal range of motion. He exhibits no edema and no tenderness.       Lumbar back: He exhibits tenderness and spasm.       Tenderness to palpation of bilateral lower back soft tissue in midline.  Neurological: He is alert and oriented to person, place, and time. He has normal reflexes. Coordination normal.  Skin: Skin is warm and dry. No rash noted. He is not diaphoretic. No  erythema.  Psychiatric: He has a normal mood and affect.    ED Course  Procedures (including critical care time)  Labs Reviewed - No data to display No results found.   1. Chronic back pain       MDM  Patient is alert and oriented and ambulating without difficulty. Patient able to get in and out of exam chair without difficulty. Patient has bilateral lower back pain and denies radiation of pain. The  signs symptoms of cauda equina or central cord compression. Patient has been seen numerous times for complaint of chronic back pain with numerous discussions about: Per primary care Dr. in pain management. Once again patient voices his understanding that he needs to follow up with his primary care Dr. for further discussion of pain.        [place], [place] 02/10/11 1032

## 2011-02-12 ENCOUNTER — Encounter (HOSPITAL_COMMUNITY): Payer: Self-pay | Admitting: Emergency Medicine

## 2011-02-12 ENCOUNTER — Emergency Department (HOSPITAL_COMMUNITY)
Admission: EM | Admit: 2011-02-12 | Discharge: 2011-02-12 | Disposition: A | Discharge status: Home / Self Care | Payer: Medicaid Other | Attending: Emergency Medicine | Admitting: Emergency Medicine

## 2011-02-12 DIAGNOSIS — R51 Headache: Secondary | ICD-10-CM | POA: Insufficient documentation

## 2011-02-12 DIAGNOSIS — M545 Low back pain, unspecified: Secondary | ICD-10-CM | POA: Insufficient documentation

## 2011-02-12 DIAGNOSIS — K2970 Gastritis, unspecified, without bleeding: Secondary | ICD-10-CM | POA: Insufficient documentation

## 2011-02-12 DIAGNOSIS — R1084 Generalized abdominal pain: Secondary | ICD-10-CM | POA: Insufficient documentation

## 2011-02-12 DIAGNOSIS — R112 Nausea with vomiting, unspecified: Secondary | ICD-10-CM | POA: Insufficient documentation

## 2011-02-12 DIAGNOSIS — I10 Essential (primary) hypertension: Secondary | ICD-10-CM | POA: Insufficient documentation

## 2011-02-12 DIAGNOSIS — G8929 Other chronic pain: Secondary | ICD-10-CM | POA: Insufficient documentation

## 2011-02-12 LAB — COMPREHENSIVE METABOLIC PANEL
ALT: 17 U/L (ref 0–53)
AST: 17 U/L (ref 0–37)
Alkaline Phosphatase: 70 U/L (ref 39–117)
CO2: 26 mEq/L (ref 19–32)
Calcium: 9.4 mg/dL (ref 8.4–10.5)
Chloride: 103 mEq/L (ref 96–112)
GFR calc non Af Amer: >90 mL/min (ref >90)
Glucose, Bld: 151 mg/dL — ABNORMAL HIGH (ref 70–99)
Potassium: 3.5 mEq/L (ref 3.5–5.1)
Sodium: 137 mEq/L (ref 135–145)
Total Bilirubin: 0.8 mg/dL (ref 0.3–1.2)

## 2011-02-12 LAB — DIFFERENTIAL
Basophils Absolute: 0.1 10*3/uL (ref 0.0–0.1)
Lymphocytes Relative: 21 % (ref 12–46)
Lymphs Abs: 2.7 10*3/uL (ref 0.7–4.0)
Neutro Abs: 9.2 10*3/uL — ABNORMAL HIGH (ref 1.7–7.7)
Neutrophils Relative %: 71 % (ref 43–77)

## 2011-02-12 LAB — URINALYSIS, ROUTINE W REFLEX MICROSCOPIC
Bilirubin Urine: NEGATIVE
Glucose, UA: NEGATIVE mg/dL
Hgb urine dipstick: NEGATIVE
Protein, ur: NEGATIVE mg/dL

## 2011-02-12 LAB — CBC
Platelets: 209 10*3/uL (ref 150–400)
RBC: 6.16 MIL/uL — ABNORMAL HIGH (ref 4.22–5.81)
RDW: 12.3 % (ref 11.5–15.5)
WBC: 13 10*3/uL — ABNORMAL HIGH (ref 4.0–10.5)

## 2011-02-12 MED ORDER — KETOROLAC TROMETHAMINE 60 MG/2ML IM SOLN
60.0000 mg | Freq: Once | INTRAMUSCULAR | Status: AC
  Administered 2011-02-12: 60 mg via INTRAMUSCULAR
  Filled 2011-02-12: qty 2

## 2011-02-12 MED ORDER — ONDANSETRON 8 MG PO TBDP: 8.0000 mg | ORAL_TABLET | Freq: Three times a day (TID) | ORAL | Status: AC | PRN

## 2011-02-12 MED ORDER — PREGABALIN 50 MG PO CAPS: 50.0000 mg | ORAL_CAPSULE | Freq: Three times a day (TID) | ORAL | Status: DC

## 2011-02-12 MED ORDER — DEXAMETHASONE SODIUM PHOSPHATE 10 MG/ML IJ SOLN
10.0000 mg | Freq: Once | INTRAMUSCULAR | Status: AC
  Administered 2011-02-12: 10 mg via INTRAMUSCULAR
  Filled 2011-02-12: qty 1

## 2011-02-12 NOTE — ED Provider Notes (Signed)
History     CSN: [id number] Arrival date & time: 02/12/2011  9:12 AM   First MD Initiated Contact with Patient 02/12/11 0919     HPI  Patient is a 40 y.o. male presenting with back pain and abdominal pain. The history is provided by the patient.  Back Pain  This is a chronic problem. Episode onset: 4 years. The problem occurs constantly. The problem has been gradually worsening. Associated with: Medication noncompliance. States he is not taking his Lyrica because he ran out of it. The pain is present in the lumbar spine. The pain does not radiate. The pain is severe. The symptoms are aggravated by certain positions. Associated symptoms include abdominal pain. Pertinent negatives include no chest pain, no fever, no numbness, no headaches, no bowel incontinence, no bladder incontinence, no dysuria, no pelvic pain, no leg pain, no paresthesias, no paresis, no tingling and no weakness. He has tried nothing for the symptoms.  Abdominal Pain The primary symptoms of the illness include abdominal pain, nausea and vomiting. The primary symptoms of the illness do not include fever, shortness of breath, diarrhea, hematochezia or dysuria. The current episode started yesterday. The onset of the illness was gradual. The problem has not changed since onset. The pain came on gradually. The abdominal pain is generalized. The abdominal pain does not radiate.  Additional symptoms associated with the illness include back pain. Symptoms associated with the illness do not include chills, constipation, urgency or hematuria.    Past Medical History  Diagnosis Date  . Back pain, chronic   . Hypertension     History reviewed. No pertinent past surgical history.  History reviewed. No pertinent family history.  History  Substance Use Topics  . Smoking status: Current Everyday Smoker  . Smokeless tobacco: Not on file  . Alcohol Use: No      Review of Systems  Constitutional: Negative for fever and chills.    HENT: Negative for neck pain.   Respiratory: Negative for cough and shortness of breath.   Cardiovascular: Negative for chest pain and palpitations.  Gastrointestinal: Positive for nausea, vomiting and abdominal pain. Negative for diarrhea, constipation, hematochezia and bowel incontinence.  Genitourinary: Negative for bladder incontinence, dysuria, urgency, hematuria, flank pain and pelvic pain.  Musculoskeletal: Positive for back pain. Negative for myalgias and gait problem.       Denies saddle anesthesias, perineal numbness, bowel incontinence, urinary incontinence  Neurological: Negative for dizziness, tingling, weakness, numbness, headaches and paresthesias.  All other systems reviewed and are negative.    Allergies  Review of patient's allergies indicates no known allergies.  Home Medications   Current Outpatient Rx  Name Route Sig Dispense Refill  . AMLODIPINE BESYLATE 10 MG PO TABS Oral Take 0.5 tablets (5 mg total) by mouth daily. 10 tablet 0  . IBUPROFEN 200 MG PO TABS Oral Take 200 mg by mouth every 6 (six) hours as needed. For pain     . LISINOPRIL 10 MG PO TABS Oral Take 1 tablet (10 mg total) by mouth daily. 30 tablet 0  . METHOCARBAMOL 500 MG PO TABS Oral Take 1 tablet (500 mg total) by mouth 4 (four) times daily. 20 tablet 0  . PREGABALIN 50 MG PO CAPS Oral Take 1 capsule (50 mg total) by mouth 3 (three) times daily. 90 capsule 0    BP 150/100  Pulse 117  Temp(Src) 97.7 F (36.5 C) (Oral)  Resp 16  SpO2 96%  Physical Exam  Vitals reviewed. Constitutional: He is  oriented to person, place, and time. He appears well-developed and well-nourished.  HENT:  Head: Normocephalic and atraumatic.  Eyes: Conjunctivae are normal. Pupils are equal, round, and reactive to light.  Neck: Normal range of motion. Neck supple.  Cardiovascular: Normal rate, regular rhythm and normal heart sounds.   Pulmonary/Chest: Effort normal and breath sounds normal.  Abdominal: Soft. Bowel  sounds are normal. He exhibits no distension and no mass. There is no tenderness. There is no rebound and no guarding.  Musculoskeletal:       Lumbar back: He exhibits tenderness, pain and spasm. He exhibits normal range of motion, no bony tenderness, no swelling, no edema, no deformity, no laceration and normal pulse.       Back:  Neurological: He is alert and oriented to person, place, and time.  Skin: Skin is warm and dry. No rash noted. No erythema. No pallor.  Psychiatric: He has a normal mood and affect. His behavior is normal.    ED Course  Procedures  Results for orders placed during the hospital encounter of 02/12/11  CBC      Component Value Range   WBC 13.0 (*) 4.0 - 10.5 (K/uL)   RBC 6.16 (*) 4.22 - 5.81 (MIL/uL)   Hemoglobin 18.3 (*) 13.0 - 17.0 (g/dL)   HCT 45.4  09.8 - 11.9 (%)   MCV 81.8  78.0 - 100.0 (fL)   MCH 29.7  26.0 - 34.0 (pg)   MCHC 36.3 (*) 30.0 - 36.0 (g/dL)   RDW 14.7  82.9 - 56.2 (%)   Platelets 209  150 - 400 (K/uL)  DIFFERENTIAL      Component Value Range   Neutrophils Relative 71  43 - 77 (%)   Neutro Abs 9.2 (*) 1.7 - 7.7 (K/uL)   Lymphocytes Relative 21  12 - 46 (%)   Lymphs Abs 2.7  0.7 - 4.0 (K/uL)   Monocytes Relative 6  3 - 12 (%)   Monocytes Absolute 0.7  0.1 - 1.0 (K/uL)   Eosinophils Relative 2  0 - 5 (%)   Eosinophils Absolute 0.2  0.0 - 0.7 (K/uL)   Basophils Relative 0  0 - 1 (%)   Basophils Absolute 0.1  0.0 - 0.1 (K/uL)  COMPREHENSIVE METABOLIC PANEL      Component Value Range   Sodium 137  135 - 145 (mEq/L)   Potassium 3.5  3.5 - 5.1 (mEq/L)   Chloride 103  96 - 112 (mEq/L)   CO2 26  19 - 32 (mEq/L)   Glucose, Bld 151 (*) 70 - 99 (mg/dL)   BUN 8  6 - 23 (mg/dL)   Creatinine, Ser 1.30  0.50 - 1.35 (mg/dL)   Calcium 9.4  8.4 - 86.5 (mg/dL)   Total Protein 7.7  6.0 - 8.3 (g/dL)   Albumin 4.0  3.5 - 5.2 (g/dL)   AST 17  0 - 37 (U/L)   ALT 17  0 - 53 (U/L)   Alkaline Phosphatase 70  39 - 117 (U/L)   Total Bilirubin 0.8  0.3  - 1.2 (mg/dL)   GFR calc non Af Amer >90  >90 (mL/min)   GFR calc Af Amer >90  >90 (mL/min)  LIPASE, BLOOD      Component Value Range   Lipase 20  11 - 59 (U/L)  URINALYSIS, ROUTINE W REFLEX MICROSCOPIC      Component Value Range   Color, Urine YELLOW  YELLOW    Appearance CLEAR  CLEAR  Specific Gravity, Urine 1.004 (*) 1.005 - 1.030    pH 6.5  5.0 - 8.0    Glucose, UA NEGATIVE  NEGATIVE (mg/dL)   Hgb urine dipstick NEGATIVE  NEGATIVE    Bilirubin Urine NEGATIVE  NEGATIVE    Ketones, ur NEGATIVE  NEGATIVE (mg/dL)   Protein, ur NEGATIVE  NEGATIVE (mg/dL)   Urobilinogen, UA 0.2  0.0 - 1.0 (mg/dL)   Nitrite NEGATIVE  NEGATIVE    Leukocytes, UA NEGATIVE  NEGATIVE      MDM    Labs indicate a slight elevation in white count likely patient has a gastritis. Will treat with prescription of antiemetics. Will treat chronic pain with renewed prescription of Lyrica. Discussed that he will need to have further prescriptions refilled by his primary care physician for treatment of chronic pain patient is agreeable to plan rate for discharge.      Thomasene Lot, [place] 02/12/11 1147

## 2011-02-12 NOTE — ED Provider Notes (Signed)
Evaluation and management procedures were performed by the PA/NP under my supervision/collaboration.   Emanuel Campos D Artis Buechele, MD 02/12/11 1721 

## 2011-02-12 NOTE — ED Notes (Signed)
Pt reports back pain onset last night, denies injury.

## 2011-02-12 NOTE — ED Notes (Signed)
Pt. reports having "spine pain and Headache for 4 years."  Pt. Reports that he developed vomiting lastnight.  Pt. Denies n/d, SOB, and fever.  Pt. Denies having a recent injury.

## 2011-02-12 NOTE — ED Notes (Signed)
Pt. Is currently wondering the hallway.

## 2011-02-15 ENCOUNTER — Emergency Department (HOSPITAL_COMMUNITY)
Admission: EM | Admit: 2011-02-15 | Discharge: 2011-02-15 | Disposition: A | Discharge status: Home / Self Care | Payer: Medicaid Other | Attending: Emergency Medicine | Admitting: Emergency Medicine

## 2011-02-15 ENCOUNTER — Encounter (HOSPITAL_COMMUNITY): Payer: Self-pay

## 2011-02-15 DIAGNOSIS — Z79899 Other long term (current) drug therapy: Secondary | ICD-10-CM | POA: Insufficient documentation

## 2011-02-15 DIAGNOSIS — M549 Dorsalgia, unspecified: Secondary | ICD-10-CM | POA: Insufficient documentation

## 2011-02-15 DIAGNOSIS — I10 Essential (primary) hypertension: Secondary | ICD-10-CM | POA: Insufficient documentation

## 2011-02-15 DIAGNOSIS — G8929 Other chronic pain: Secondary | ICD-10-CM | POA: Insufficient documentation

## 2011-02-15 MED ORDER — KETOROLAC TROMETHAMINE 60 MG/2ML IM SOLN
60.0000 mg | Freq: Once | INTRAMUSCULAR | Status: AC
  Administered 2011-02-15: 60 mg via INTRAMUSCULAR
  Filled 2011-02-15: qty 2

## 2011-02-15 NOTE — ED Notes (Signed)
Patient here with complaint of lower backpain. Reports that his pain was worse last night, denies trauma. Ambulatory on arrival

## 2011-02-15 NOTE — ED Provider Notes (Signed)
History     CSN: [id number] Arrival date & time: 02/15/2011 10:49 AM   First MD Initiated Contact with Patient 02/15/11 1101      No chief complaint on file.   (Consider location/radiation/quality/duration/timing/severity/associated sxs/prior treatment) HPI Comments: Patient states that he has had chronic back pain for the last 4 years that has worsened since last night.  He denies any recent injury or trauma to the area.  He denies radiation of the pain, numbness or tingling of lower extremities, any urgency, or urinary retention, bowel or bladder incontinence, and all other complaints.  Patient denies abdominal pain, and headache.  Of note, the patient has been to the emergency department 11 times this month.  He is currently taking his Lyrica and states he saw [facility] PCP.  Last month and will see him again next month.  Patient in the past has been noncompliant with followup and medications.  The history is provided by the patient. The history is limited by a language barrier. No language interpreter was used.    Past Medical History  Diagnosis Date  . Back pain, chronic   . Hypertension     History reviewed. No pertinent past surgical history.  No family history on file.  History  Substance Use Topics  . Smoking status: Current Everyday Smoker  . Smokeless tobacco: Not on file  . Alcohol Use: No      Review of Systems  Constitutional: Negative for fever, fatigue and unexpected weight change.  HENT: Negative for neck pain and neck stiffness.   Eyes: Negative for visual disturbance.  Respiratory: Negative for cough.   Gastrointestinal: Negative for nausea, abdominal pain, diarrhea, constipation, abdominal distention and rectal pain.  Genitourinary: Negative for dysuria, urgency and difficulty urinating.  Musculoskeletal: Positive for back pain. Negative for myalgias, arthralgias and gait problem.  Skin: Negative for rash.  Neurological: Negative for tremors,  syncope, weakness, numbness and headaches.    Allergies  Review of patient's allergies indicates no known allergies.  Home Medications   Current Outpatient Rx  Name Route Sig Dispense Refill  . AMLODIPINE BESYLATE 10 MG PO TABS Oral Take 0.5 tablets (5 mg total) by mouth daily. 10 tablet 0  . IBUPROFEN 200 MG PO TABS Oral Take 200 mg by mouth every 6 (six) hours as needed. For pain     . LISINOPRIL 10 MG PO TABS Oral Take 1 tablet (10 mg total) by mouth daily. 30 tablet 0  . METHOCARBAMOL 500 MG PO TABS Oral Take 1 tablet (500 mg total) by mouth 4 (four) times daily. 20 tablet 0  . ONDANSETRON 8 MG PO TBDP Oral Take 1 tablet (8 mg total) by mouth every 8 (eight) hours as needed for nausea. 20 tablet 0  . PREGABALIN 50 MG PO CAPS Oral Take 1 capsule (50 mg total) by mouth 3 (three) times daily. 45 capsule 0    BP 131/82  Pulse 95  Temp(Src) 97.6 F (36.4 C) (Oral)  Resp 24  SpO2 96%  Physical Exam  Constitutional: He is oriented to person, place, and time. He appears well-developed and well-nourished. No distress.  HENT:  Head: Normocephalic and atraumatic.  Eyes: Conjunctivae and EOM are normal. Pupils are equal, round, and reactive to light. No scleral icterus.  Neck: Normal range of motion and full passive range of motion without pain. Neck supple. No tracheal tenderness, no spinous process tenderness and no muscular tenderness present. Carotid bruit is not present. No Brudzinski's sign noted. No mass  and no thyromegaly present.  Cardiovascular: Normal rate, regular rhythm and intact distal pulses.  Exam reveals no gallop and no friction rub.   No murmur heard. Pulmonary/Chest: Effort normal and breath sounds normal. No stridor. No respiratory distress. He has no wheezes. He has no rales. He exhibits no tenderness.  Abdominal: Soft. Bowel sounds are normal.  Musculoskeletal:       Cervical back: He exhibits normal range of motion, no tenderness, no bony tenderness and no pain.         Thoracic back: He exhibits no tenderness, no bony tenderness and no pain.       Lumbar back: He exhibits tenderness and pain. He exhibits no bony tenderness, no spasm and normal pulse.       Back:       Right foot: He exhibits no swelling.       Left foot: He exhibits no swelling.       Patient denies pain with range of motion of lumbar spine.  Forward flexion and extension.  There is no pain with ambulation.  Note that patient moves freely standing and sitting from chair as well as ambulating around hallway.  Neurological: He is alert and oriented to person, place, and time. He has normal strength and normal reflexes. No cranial nerve deficit or sensory deficit.  Skin: Skin is warm and dry. No rash noted. He is not diaphoretic. No erythema. No pallor.  Psychiatric: He has a normal mood and affect.    ED Course  Procedures (including critical care time)   Patient is alert and oriented x3, ambulating around hallway, and sitting, and standing from exam chair without difficulty or pain complaints. Pt denies symptoms of central cord compression/cuada equina. Pt states he will follow up with PCP from [facility] next month.   Pt complains of insomnia, denies SI & HI, does not want to talk to [facility] in regards to his insomnia. 1 Toradol shot given with explanation that this is chronic pain that needs to be managed by [facility]. The s/s of emergency back pain were discussed.   MDM  Chronic back pain x 4 years (11 ED visits this mo)         [place], [place] 02/15/11 1129

## 2011-02-17 ENCOUNTER — Encounter (HOSPITAL_COMMUNITY): Payer: Self-pay | Admitting: Emergency Medicine

## 2011-02-17 ENCOUNTER — Emergency Department (HOSPITAL_COMMUNITY)
Admission: EM | Admit: 2011-02-17 | Discharge: 2011-02-17 | Disposition: A | Discharge status: Home / Self Care | Payer: Medicaid Other | Attending: Emergency Medicine | Admitting: Emergency Medicine

## 2011-02-17 DIAGNOSIS — G8929 Other chronic pain: Secondary | ICD-10-CM | POA: Insufficient documentation

## 2011-02-17 DIAGNOSIS — M549 Dorsalgia, unspecified: Secondary | ICD-10-CM

## 2011-02-17 DIAGNOSIS — Z79899 Other long term (current) drug therapy: Secondary | ICD-10-CM | POA: Insufficient documentation

## 2011-02-17 DIAGNOSIS — M545 Low back pain, unspecified: Secondary | ICD-10-CM | POA: Insufficient documentation

## 2011-02-17 DIAGNOSIS — I10 Essential (primary) hypertension: Secondary | ICD-10-CM | POA: Insufficient documentation

## 2011-02-17 DIAGNOSIS — F17200 Nicotine dependence, unspecified, uncomplicated: Secondary | ICD-10-CM | POA: Insufficient documentation

## 2011-02-17 MED ORDER — KETOROLAC TROMETHAMINE 30 MG/ML IJ SOLN
30.0000 mg | Freq: Once | INTRAMUSCULAR | Status: AC
  Administered 2011-02-17: 30 mg via INTRAMUSCULAR

## 2011-02-17 MED ORDER — KETOROLAC TROMETHAMINE 30 MG/ML IJ SOLN
30.0000 mg | Freq: Once | INTRAMUSCULAR | Status: DC
  Filled 2011-02-17: qty 1

## 2011-02-17 NOTE — ED Provider Notes (Signed)
Medical screening examination/treatment/procedure(s) were performed by non-physician practitioner and as supervising physician I was immediately available for consultation/collaboration.   Chetara Kropp A. Patrica Duel, MD 02/17/11 1245

## 2011-02-17 NOTE — ED Provider Notes (Signed)
History     CSN: [id number] Arrival date & time: 02/17/2011  9:33 AM   First MD Initiated Contact with Patient 02/17/11 1053      Chief Complaint  Patient presents with  . Back Pain    (Consider location/radiation/quality/duration/timing/severity/associated sxs/prior treatment) HPI Comments: Patient reports that he has had chronic back pain for the past 4 years.  No new trauma or injury.  Was seen in the Emergency Department for the same complaint 2 days ago.  Patient is a 40 y.o. male presenting with back pain. The history is provided by the patient.  Back Pain  This is a chronic problem. The problem occurs constantly. The problem has not changed since onset.The pain is associated with no known injury. The pain is present in the lumbar spine. The pain does not radiate. Pertinent negatives include no chest pain, no fever, no numbness, no headaches, no abdominal pain, no bowel incontinence, no perianal numbness, no bladder incontinence, no dysuria, no leg pain, no paresthesias, no paresis, no tingling and no weakness. He has tried nothing for the symptoms.    Past Medical History  Diagnosis Date  . Back pain, chronic   . Hypertension     History reviewed. No pertinent past surgical history.  History reviewed. No pertinent family history.  History  Substance Use Topics  . Smoking status: Current Everyday Smoker  . Smokeless tobacco: Not on file  . Alcohol Use: No      Review of Systems  Constitutional: Negative for fever.  HENT: Negative for neck pain and neck stiffness.   Respiratory: Negative for shortness of breath and wheezing.   Cardiovascular: Negative for chest pain.  Gastrointestinal: Negative for nausea, vomiting, abdominal pain, diarrhea and bowel incontinence.  Genitourinary: Negative for bladder incontinence, dysuria, decreased urine volume and difficulty urinating.  Musculoskeletal: Positive for back pain.  Skin: Negative for pallor and rash.    Neurological: Negative for dizziness, tingling, syncope, weakness, numbness, headaches and paresthesias.    Allergies  Review of patient's allergies indicates no known allergies.  Home Medications   Current Outpatient Rx  Name Route Sig Dispense Refill  . AMLODIPINE BESYLATE 10 MG PO TABS Oral Take 0.5 tablets (5 mg total) by mouth daily. 10 tablet 0  . IBUPROFEN 200 MG PO TABS Oral Take 200 mg by mouth every 6 (six) hours as needed. For pain     . LISINOPRIL 10 MG PO TABS Oral Take 1 tablet (10 mg total) by mouth daily. 30 tablet 0  . METHOCARBAMOL 500 MG PO TABS Oral Take 1 tablet (500 mg total) by mouth 4 (four) times daily. 20 tablet 0  . ONDANSETRON 8 MG PO TBDP Oral Take 1 tablet (8 mg total) by mouth every 8 (eight) hours as needed for nausea. 20 tablet 0  . PREGABALIN 50 MG PO CAPS Oral Take 1 capsule (50 mg total) by mouth 3 (three) times daily. 45 capsule 0    BP 131/99  Pulse 116  Temp(Src) 98.4 F (36.9 C) (Oral)  Resp 17  SpO2 96%  Physical Exam  Constitutional: He is oriented to person, place, and time. He appears well-developed and well-nourished. No distress.  HENT:  Head: Normocephalic and atraumatic.  Neck: Normal range of motion. Neck supple.  Cardiovascular: Normal rate, regular rhythm and normal heart sounds.   Pulmonary/Chest: Effort normal and breath sounds normal.  Abdominal: Soft. There is no tenderness.  Musculoskeletal: Normal range of motion.  Neurological: He is alert and oriented to person,  place, and time. He has normal strength and normal reflexes. No sensory deficit. Coordination and gait normal.  Skin: Skin is warm and dry. No rash noted. He is not diaphoretic. No erythema.  Psychiatric: He has a normal mood and affect.    ED Course  Procedures (including critical care time)  Labs Reviewed - No data to display No results found.   1. Chronic back pain       MDM  Back pain chronic.  Unchanged from back pain in the past.  No red  flags of back pain.  Therefore, did not feel that any imaging was indicated.  Recommended that patient follow up with his PCP for pain management.          Pascal Lux [facility] 02/18/11 1644

## 2011-02-17 NOTE — ED Notes (Signed)
Pt here with mid back pain and can not sleep; pt seen here for same in past

## 2011-02-18 ENCOUNTER — Encounter (HOSPITAL_COMMUNITY): Payer: Self-pay | Admitting: Adult Health

## 2011-02-18 ENCOUNTER — Emergency Department (HOSPITAL_COMMUNITY)
Admission: EM | Admit: 2011-02-18 | Discharge: 2011-02-18 | Discharge status: Against Medical Advice | Payer: Medicaid Other | Attending: Emergency Medicine | Admitting: Emergency Medicine

## 2011-02-18 DIAGNOSIS — M549 Dorsalgia, unspecified: Secondary | ICD-10-CM | POA: Insufficient documentation

## 2011-02-18 NOTE — ED Provider Notes (Signed)
Evaluation and management procedures were performed by the PA/NP under my supervision/collaboration.   Capucine Tryon, MD 02/18/11 2350 

## 2011-02-18 NOTE — ED Notes (Signed)
Here for chronic back pain

## 2011-11-07 ENCOUNTER — Encounter (HOSPITAL_COMMUNITY): Payer: Self-pay | Admitting: *Deleted

## 2011-11-07 DIAGNOSIS — F1010 Alcohol abuse, uncomplicated: Secondary | ICD-10-CM | POA: Diagnosis present

## 2011-11-07 DIAGNOSIS — F1210 Cannabis abuse, uncomplicated: Secondary | ICD-10-CM | POA: Diagnosis present

## 2011-11-07 DIAGNOSIS — G8929 Other chronic pain: Secondary | ICD-10-CM | POA: Diagnosis present

## 2011-11-07 DIAGNOSIS — K759 Inflammatory liver disease, unspecified: Secondary | ICD-10-CM | POA: Diagnosis present

## 2011-11-07 DIAGNOSIS — F411 Generalized anxiety disorder: Secondary | ICD-10-CM | POA: Diagnosis present

## 2011-11-07 DIAGNOSIS — K7200 Acute and subacute hepatic failure without coma: Principal | ICD-10-CM | POA: Diagnosis present

## 2011-11-07 DIAGNOSIS — Z8711 Personal history of peptic ulcer disease: Secondary | ICD-10-CM

## 2011-11-07 DIAGNOSIS — K7689 Other specified diseases of liver: Secondary | ICD-10-CM | POA: Diagnosis present

## 2011-11-07 DIAGNOSIS — E876 Hypokalemia: Secondary | ICD-10-CM | POA: Diagnosis present

## 2011-11-07 DIAGNOSIS — R1013 Epigastric pain: Secondary | ICD-10-CM | POA: Diagnosis present

## 2011-11-07 DIAGNOSIS — F1120 Opioid dependence, uncomplicated: Secondary | ICD-10-CM | POA: Diagnosis present

## 2011-11-07 DIAGNOSIS — K7010 Alcoholic hepatitis without ascites: Secondary | ICD-10-CM | POA: Diagnosis present

## 2011-11-07 DIAGNOSIS — K838 Other specified diseases of biliary tract: Secondary | ICD-10-CM | POA: Diagnosis present

## 2011-11-07 DIAGNOSIS — F17200 Nicotine dependence, unspecified, uncomplicated: Secondary | ICD-10-CM | POA: Diagnosis present

## 2011-11-07 DIAGNOSIS — R7309 Other abnormal glucose: Secondary | ICD-10-CM | POA: Diagnosis present

## 2011-11-07 DIAGNOSIS — K831 Obstruction of bile duct: Secondary | ICD-10-CM | POA: Diagnosis present

## 2011-11-07 DIAGNOSIS — Z9089 Acquired absence of other organs: Secondary | ICD-10-CM

## 2011-11-07 DIAGNOSIS — K219 Gastro-esophageal reflux disease without esophagitis: Secondary | ICD-10-CM | POA: Diagnosis present

## 2011-11-07 DIAGNOSIS — R7402 Elevation of levels of lactic acid dehydrogenase (LDH): Secondary | ICD-10-CM | POA: Diagnosis present

## 2011-11-07 DIAGNOSIS — I714 Abdominal aortic aneurysm, without rupture, unspecified: Secondary | ICD-10-CM | POA: Diagnosis present

## 2011-11-07 DIAGNOSIS — R17 Unspecified jaundice: Secondary | ICD-10-CM | POA: Diagnosis present

## 2011-11-07 DIAGNOSIS — M549 Dorsalgia, unspecified: Secondary | ICD-10-CM | POA: Diagnosis present

## 2011-11-07 DIAGNOSIS — K861 Other chronic pancreatitis: Secondary | ICD-10-CM | POA: Diagnosis present

## 2011-11-07 DIAGNOSIS — F259 Schizoaffective disorder, unspecified: Secondary | ICD-10-CM | POA: Diagnosis present

## 2011-11-07 DIAGNOSIS — I10 Essential (primary) hypertension: Secondary | ICD-10-CM | POA: Diagnosis present

## 2011-11-07 DIAGNOSIS — F3130 Bipolar disorder, current episode depressed, mild or moderate severity, unspecified: Secondary | ICD-10-CM | POA: Diagnosis present

## 2011-11-07 DIAGNOSIS — R7401 Elevation of levels of liver transaminase levels: Secondary | ICD-10-CM | POA: Diagnosis present

## 2011-11-07 LAB — URINALYSIS, ROUTINE W REFLEX MICROSCOPIC
Glucose, UA: NEGATIVE mg/dL
Hgb urine dipstick: NEGATIVE
Ketones, ur: 15 mg/dL — AB
Leukocytes, UA: NEGATIVE
Protein, ur: NEGATIVE mg/dL
pH: 7 (ref 5.0–8.0)

## 2011-11-07 NOTE — ED Notes (Signed)
Pt c/o upper abdominal pain since 2 pm today, vomited x 3.  Denies CP, SOB.

## 2011-11-08 ENCOUNTER — Inpatient Hospital Stay (HOSPITAL_COMMUNITY)
Admission: EM | Admit: 2011-11-08 | Discharge: 2011-11-14 | DRG: 441 | Disposition: A | Discharge status: Home / Self Care | Payer: Medicaid Other | Attending: Internal Medicine | Admitting: Internal Medicine

## 2011-11-08 ENCOUNTER — Inpatient Hospital Stay (HOSPITAL_COMMUNITY): Payer: Medicaid Other

## 2011-11-08 ENCOUNTER — Encounter (HOSPITAL_COMMUNITY): Payer: Self-pay | Admitting: Internal Medicine

## 2011-11-08 DIAGNOSIS — M1990 Unspecified osteoarthritis, unspecified site: Secondary | ICD-10-CM

## 2011-11-08 DIAGNOSIS — R0602 Shortness of breath: Secondary | ICD-10-CM

## 2011-11-08 DIAGNOSIS — Z8711 Personal history of peptic ulcer disease: Secondary | ICD-10-CM

## 2011-11-08 DIAGNOSIS — K861 Other chronic pancreatitis: Secondary | ICD-10-CM

## 2011-11-08 DIAGNOSIS — R300 Dysuria: Secondary | ICD-10-CM

## 2011-11-08 DIAGNOSIS — R7309 Other abnormal glucose: Secondary | ICD-10-CM

## 2011-11-08 DIAGNOSIS — I639 Cerebral infarction, unspecified: Secondary | ICD-10-CM

## 2011-11-08 DIAGNOSIS — F1010 Alcohol abuse, uncomplicated: Secondary | ICD-10-CM

## 2011-11-08 DIAGNOSIS — G8929 Other chronic pain: Secondary | ICD-10-CM

## 2011-11-08 DIAGNOSIS — R739 Hyperglycemia, unspecified: Secondary | ICD-10-CM

## 2011-11-08 DIAGNOSIS — J42 Unspecified chronic bronchitis: Secondary | ICD-10-CM

## 2011-11-08 DIAGNOSIS — F99 Mental disorder, not otherwise specified: Secondary | ICD-10-CM

## 2011-11-08 DIAGNOSIS — R7401 Elevation of levels of liver transaminase levels: Secondary | ICD-10-CM

## 2011-11-08 DIAGNOSIS — R748 Abnormal levels of other serum enzymes: Secondary | ICD-10-CM

## 2011-11-08 DIAGNOSIS — F1210 Cannabis abuse, uncomplicated: Secondary | ICD-10-CM

## 2011-11-08 DIAGNOSIS — R109 Unspecified abdominal pain: Secondary | ICD-10-CM

## 2011-11-08 DIAGNOSIS — R519 Headache, unspecified: Secondary | ICD-10-CM

## 2011-11-08 DIAGNOSIS — R569 Unspecified convulsions: Secondary | ICD-10-CM

## 2011-11-08 DIAGNOSIS — K759 Inflammatory liver disease, unspecified: Secondary | ICD-10-CM

## 2011-11-08 DIAGNOSIS — G4700 Insomnia, unspecified: Secondary | ICD-10-CM

## 2011-11-08 HISTORY — DX: Cerebral infarction, unspecified: I63.9

## 2011-11-08 HISTORY — DX: Shortness of breath: R06.02

## 2011-11-08 HISTORY — DX: Mental disorder, not otherwise specified: F99

## 2011-11-08 HISTORY — DX: Bipolar disorder, unspecified: F31.9

## 2011-11-08 HISTORY — DX: Schizoaffective disorder, bipolar type: F25.0

## 2011-11-08 HISTORY — DX: Unspecified osteoarthritis, unspecified site: M19.90

## 2011-11-08 HISTORY — DX: Acute myocardial infarction, unspecified: I21.9

## 2011-11-08 HISTORY — DX: Dysuria: R30.0

## 2011-11-08 HISTORY — DX: Anxiety disorder, unspecified: F41.9

## 2011-11-08 HISTORY — DX: Unspecified chronic bronchitis: J42

## 2011-11-08 HISTORY — DX: Unspecified convulsions: R56.9

## 2011-11-08 HISTORY — DX: Inflammatory liver disease, unspecified: K75.9

## 2011-11-08 HISTORY — DX: Schizoaffective disorder, unspecified: F25.9

## 2011-11-08 HISTORY — DX: Personal history of peptic ulcer disease: Z87.11

## 2011-11-08 HISTORY — DX: Headache, unspecified: R51.9

## 2011-11-08 HISTORY — DX: Pneumonia, unspecified organism: J18.9

## 2011-11-08 HISTORY — DX: Personal history of other diseases of the digestive system: Z87.19

## 2011-11-08 HISTORY — DX: Headache: R51

## 2011-11-08 HISTORY — DX: Gastro-esophageal reflux disease without esophagitis: K21.9

## 2011-11-08 HISTORY — DX: Depression, unspecified: F32.A

## 2011-11-08 HISTORY — DX: Major depressive disorder, single episode, unspecified: F32.9

## 2011-11-08 LAB — HEPATIC FUNCTION PANEL
ALT: 348 U/L — ABNORMAL HIGH (ref 0–53)
AST: 880 U/L — ABNORMAL HIGH (ref 0–37)
Albumin: 4 g/dL (ref 3.5–5.2)
Alkaline Phosphatase: 211 U/L — ABNORMAL HIGH (ref 39–117)
Total Bilirubin: 1.7 mg/dL — ABNORMAL HIGH (ref 0.3–1.2)
Total Protein: 7 g/dL (ref 6.0–8.3)

## 2011-11-08 LAB — CBC
HCT: 49.1 % (ref 39.0–52.0)
MCH: 33.1 pg (ref 26.0–34.0)
MCV: 91.3 fL (ref 78.0–100.0)
Platelets: 193 10*3/uL (ref 150–400)
RBC: 5.38 MIL/uL (ref 4.22–5.81)

## 2011-11-08 LAB — APTT: aPTT: 26 seconds (ref 24–37)

## 2011-11-08 LAB — TROPONIN I
Troponin I: <0.3 ng/mL (ref <0.30)
Troponin I: <0.3 ng/mL (ref <0.30)

## 2011-11-08 LAB — BASIC METABOLIC PANEL
BUN: 8 mg/dL (ref 6–23)
CO2: 27 mEq/L (ref 19–32)
Calcium: 9.3 mg/dL (ref 8.4–10.5)
Creatinine, Ser: 0.85 mg/dL (ref 0.50–1.35)

## 2011-11-08 LAB — RAPID URINE DRUG SCREEN, HOSP PERFORMED
Benzodiazepines: NOT DETECTED
Cocaine: NOT DETECTED
Opiates: POSITIVE — AB

## 2011-11-08 LAB — ACETAMINOPHEN LEVEL: Acetaminophen (Tylenol), Serum: <15 ug/mL (ref 10–30)

## 2011-11-08 MED ORDER — BOOST / RESOURCE BREEZE PO LIQD
1.0000 | Freq: Three times a day (TID) | ORAL | Status: DC
  Administered 2011-11-09 – 2011-11-13 (×14): 1 via ORAL

## 2011-11-08 MED ORDER — ONDANSETRON HCL 4 MG/2ML IJ SOLN
4.0000 mg | Freq: Once | INTRAMUSCULAR | Status: AC
  Administered 2011-11-08: 4 mg via INTRAVENOUS
  Filled 2011-11-08: qty 2

## 2011-11-08 MED ORDER — ADULT MULTIVITAMIN W/MINERALS CH
1.0000 | ORAL_TABLET | Freq: Every day | ORAL | Status: DC
  Administered 2011-11-08 – 2011-11-14 (×7): 1 via ORAL
  Filled 2011-11-08 (×7): qty 1

## 2011-11-08 MED ORDER — FOLIC ACID 1 MG PO TABS
1.0000 mg | ORAL_TABLET | Freq: Every day | ORAL | Status: DC
  Administered 2011-11-08 – 2011-11-14 (×7): 1 mg via ORAL
  Filled 2011-11-08 (×7): qty 1

## 2011-11-08 MED ORDER — MORPHINE SULFATE 4 MG/ML IJ SOLN
4.0000 mg | Freq: Once | INTRAMUSCULAR | Status: AC
  Administered 2011-11-08: 4 mg via INTRAVENOUS
  Filled 2011-11-08: qty 1

## 2011-11-08 MED ORDER — LORAZEPAM 2 MG/ML IJ SOLN: 1.0000 mg | Freq: Four times a day (QID) | INTRAMUSCULAR | Status: AC | PRN

## 2011-11-08 MED ORDER — AMLODIPINE BESYLATE 5 MG PO TABS
5.0000 mg | ORAL_TABLET | Freq: Every day | ORAL | Status: DC
  Administered 2011-11-08 – 2011-11-14 (×7): 5 mg via ORAL
  Filled 2011-11-08 (×11): qty 1

## 2011-11-08 MED ORDER — LISINOPRIL 10 MG PO TABS
10.0000 mg | ORAL_TABLET | Freq: Every day | ORAL | Status: DC
  Administered 2011-11-08 – 2011-11-14 (×7): 10 mg via ORAL
  Filled 2011-11-08 (×7): qty 1

## 2011-11-08 MED ORDER — PNEUMOCOCCAL VAC POLYVALENT 25 MCG/0.5ML IJ INJ
0.5000 mL | INJECTION | INTRAMUSCULAR | Status: AC
  Filled 2011-11-08: qty 0.5

## 2011-11-08 MED ORDER — LORAZEPAM 1 MG PO TABS
1.0000 mg | ORAL_TABLET | Freq: Four times a day (QID) | ORAL | Status: AC | PRN
  Administered 2011-11-09 – 2011-11-10 (×4): 1 mg via ORAL
  Filled 2011-11-08 (×4): qty 1

## 2011-11-08 MED ORDER — HYDROMORPHONE HCL PF 1 MG/ML IJ SOLN
1.0000 mg | INTRAMUSCULAR | Status: DC | PRN
  Administered 2011-11-08 – 2011-11-13 (×31): 1 mg via INTRAVENOUS
  Filled 2011-11-08 (×31): qty 1

## 2011-11-08 MED ORDER — PREGABALIN 50 MG PO CAPS
50.0000 mg | ORAL_CAPSULE | Freq: Three times a day (TID) | ORAL | Status: DC
  Administered 2011-11-08 – 2011-11-14 (×17): 50 mg via ORAL
  Filled 2011-11-08 (×17): qty 1

## 2011-11-08 MED ORDER — FENTANYL CITRATE 0.05 MG/ML IJ SOLN
100.0000 ug | Freq: Once | INTRAMUSCULAR | Status: AC
  Administered 2011-11-08: 100 ug via INTRAVENOUS
  Filled 2011-11-08: qty 2

## 2011-11-08 MED ORDER — SODIUM CHLORIDE 0.9 % IV SOLN
INTRAVENOUS | Status: DC
  Administered 2011-11-08: 07:00:00 via INTRAVENOUS
  Administered 2011-11-09: 175 mL/h via INTRAVENOUS
  Administered 2011-11-09 – 2011-11-13 (×8): via INTRAVENOUS

## 2011-11-08 MED ORDER — GADOBENATE DIMEGLUMINE 529 MG/ML IV SOLN
19.0000 mL | Freq: Once | INTRAVENOUS | Status: AC
  Administered 2011-11-08: 19 mL via INTRAVENOUS

## 2011-11-08 MED ORDER — VITAMIN B-1 100 MG PO TABS
100.0000 mg | ORAL_TABLET | Freq: Every day | ORAL | Status: DC
  Administered 2011-11-08 – 2011-11-14 (×7): 100 mg via ORAL
  Filled 2011-11-08 (×7): qty 1

## 2011-11-08 NOTE — Progress Notes (Signed)
[facility] PROGRESS NOTE  Dustin Hansen WUJ:[id number] DOB: [birth year] DOA: 11/08/2011 PCP: Arelia Sneddon, MD  Assessment/Plan: Acute alcoholic hepatitis -Vomiting appears better today -Will try and hepatic panel -Start a liquid diet -Followup viral hepatitis serologies -Place the patient on AWAS protocol, start thiamine, multivitamin -Continue IV fluids -Check urine drug screen  hyperlipasemia -The patient may have a mild degree of pancreatitis -Lipase was only 61 -Previous alcohol use may have resulted in a burnt out pancreas which may only result in mild lipase elevations Transaminasemia -Order MRCP -New ductal dilatation noted on abdominal ultrasound, not noted on previous CTs of the abdomen   Procedures/Studies: US Abdomen Complete  11/08/2011  *RADIOLOGY REPORT*  Clinical Data:  Abnormal liver function studies.  COMPLETE ABDOMINAL ULTRASOUND  Comparison:  CT abdomen and pelvis 02/24/2010  Findings:  Gallbladder:  Surgically absent.  There appears to be a surgical clip in the gallbladder fossa.  Common bile duct:  Extrahepatic common bile ducts measure up to about 16 mm diameter.  This likely represents postoperative physiologic dilatation.  No common duct stones are visualized.  Liver:  Diffuse heterogeneous increased hepatic parenchymal echotexture suggesting fatty infiltration.  The no focal lesions are identified.  Limited color flow Doppler images of the main portal vein show flow in the appropriate directions.  IVC:  Appears normal.  Pancreas:  Pancreas is mostly obscured by overlying bowel gas and is poorly visualized.  Spleen:  Spleen length measures 9.8 cm.  Normal parenchymal echotexture.  Right Kidney:  Right kidney measures 12.2 cm length.  No hydronephrosis.  Left Kidney:  Left kidney measures 12.1 cm length.  No hydronephrosis.  Abdominal aorta:  Limited visualization.  No aneurysm identified in the visualized areas.  IMPRESSION: Surgical absence of the gallbladder.   Moderate extrahepatic bile duct dilatation is likely due to postoperative physiology.  Diffuse fatty infiltration of the liver.   Original Report Authenticated By: Marlon Pel, Mhyperlipasemia D.    Acute Abdominal Series  11/08/2011  *RADIOLOGY REPORT*  Clinical Data: Epigastric pain, nausea, vomiting, chills, cough. Smoker.  ACUTE ABDOMEN SERIES (ABDOMEN 2 VIEW & CHEST 1 VIEW)  Comparison: 02/24/2010  Findings: Slightly shallow inspiration.  Normal heart size and pulmonary vascularity.  No focal airspace consolidation in the lungs.  Slight fibrosis or atelectasis in the lung bases.  No blunting of costophrenic angles.  No pneumothorax.  Gas filled small bowel in the left upper quadrant are at the upper limits of normal caliber.  Localized ileus or early obstructive change could have this appearance.  Gas and stool scattered throughout the colon without distension.  No free intra-abdominal air.  No abnormal air fluid levels.  Surgical clips in the right upper quadrant.  Degenerative changes in the hips.  IMPRESSION: Nonspecific bowel gas pattern with borderline dilated small bowel loop in the left upper quadrant likely to represent ileus or enteritis.  Early obstruction is not entirely excluded.  Followup as clinically indicated.   Original Report Authenticated By: Marlon Pel, M.D.         Code Status: Full Family Communication: Pt at bedside Disposition Plan: Home when medically stable  Subjective: Patient still complains of epigastric and right upper quadrant abdominal pain. Vomiting has improved. He feels hungry. He wants to eat. Denies any chest pain, shortness of breath, rectal bleeding, hematochezia, melena. Denies any fevers, chills, headaches.  Objective: Filed Vitals:   11/07/11 2257 11/08/11 0432 11/08/11 0646 11/08/11 1041  BP: 153/105 153/96 148/87 130/84  Pulse: 60 59 72 65  Temp: 97.5 F (36.4 C) 97 F (36.1 C) 97.6 F (36.4 C) 97.9 F (36.6 C)  TempSrc:  Oral  Oral Oral  Resp: 20 16 18 18   SpO2: 98% 98% 98% 98%   No intake or output data in the 24 hours ending 11/08/11 1202 Weight change:  Exam:   General:  Pt is alert, follows commands appropriately, not in acute distress  HEENT: No icterus, No thrush, No neck mass, [place]/AT  Cardiovascular: Regular rate and rhythm, S1/S2, , no rubs, no gallops  Respiratory: Clear to auscultation bilaterally, no wheezing, no crackles, no rhonchi  Abdomen: Soft, epigastric and right upper quadrant tenderness; non distended, bowel sounds present, no guarding, no rebound tenderness  Extremities: No edema, pulses DP and PT palpable bilaterally  Data Reviewed: Basic Metabolic Panel:  Lab 11/07/11 1610  NA 141  K 3.4*  CL 101  CO2 27  GLUCOSE 138*  BUN 8  CREATININE 0.85  CALCIUM 9.3  MG --  PHOS --   Liver Function Tests:  Lab 11/07/11 2300  AST 880*  ALT 348*  ALKPHOS 211*  BILITOT 1.7*  PROT 7.0  ALBUMIN 4.0    Lab 11/07/11 2300  LIPASE 61*  AMYLASE --   No results found for this basename: AMMONIA:5 in the last 168 hours CBC:  Lab 11/07/11 2300  WBC 9.1  NEUTROABS --  HGB 17.8*  HCT 49.1  MCV 91.3  PLT 193   Cardiac Enzymes: No results found for this basename: CKTOTAL:5,CKMB:5,CKMBINDEX:5,TROPONINI:5 in the last 168 hours BNP: No components found with this basename: POCBNP:5 CBG: No results found for this basename: GLUCAP:5 in the last 168 hours  No results found for this or any previous visit (from the past 240 hour(s)).   Scheduled Meds:   . fentaNYL  100 mcg Intravenous Once  . folic acid  1 mg Oral Daily  .  morphine injection  4 mg Intravenous Once  . multivitamin with minerals  1 tablet Oral Daily  . ondansetron  4 mg Intravenous Once  . thiamine  100 mg Oral Daily   Continuous Infusions:   . sodium chloride 175 mL/hr at 11/08/11 0630     Ariyanna Oien, DO  [facility] Pager [phone]  If 7PM-7AM, please contact  night-coverage [email or web address] Password [email or web address] 11/08/2011, 12:02 PM   LOS: 0 days

## 2011-11-08 NOTE — ED Provider Notes (Addendum)
History     CSN: [id number]  Arrival date & time 11/07/11  2249   First MD Initiated Contact with Patient 11/08/11 0059      Chief Complaint  Patient presents with  . Abdominal Pain    (Consider location/radiation/quality/duration/timing/severity/associated sxs/prior treatment) HPI 41 year old male presents to the emergency apartment complaining of epigastric pain and nausea and vomiting starting yesterday around 2 PM. He denies any travel, no unusual foods, no sick contacts. Patient is a difficult historian, English is not his first language.  Pt does not want translator.  No fevers,chills, no OTC medications.  No diarrhea.  Past Medical History  Diagnosis Date  . Back pain, chronic   . Hypertension   . AAA (abdominal aortic aneurysm) 10/2010    4.1cm on CT scan    Past Surgical History  Procedure Date  . Cholecystectomy     Family History  Problem Relation Age of Onset  . Hypertension Mother     History  Substance Use Topics  . Smoking status: Current Everyday Smoker -- 1.0 packs/day  . Smokeless tobacco: Never Used  . Alcohol Use: 2.5 oz/week    5 drink(s) per week      Review of Systems  Unable to perform ROS: Other  language barrier  Allergies  Review of patient's allergies indicates no known allergies.  Home Medications   Current Outpatient Rx  Name Route Sig Dispense Refill  . AMLODIPINE BESYLATE 10 MG PO TABS Oral Take 0.5 tablets (5 mg total) by mouth daily. 10 tablet 0  . IBUPROFEN 200 MG PO TABS Oral Take 200 mg by mouth every 6 (six) hours as needed. For pain     . LISINOPRIL 10 MG PO TABS Oral Take 1 tablet (10 mg total) by mouth daily. 30 tablet 0  . PREGABALIN 50 MG PO CAPS Oral Take 1 capsule (50 mg total) by mouth 3 (three) times daily. 45 capsule 0    BP 153/96  Pulse 59  Temp 97 F (36.1 C) (Oral)  Resp 16  SpO2 98%  Physical Exam  Nursing note and vitals reviewed. Constitutional: He is oriented to person, place, and time. He  appears well-developed and well-nourished. He appears distressed (uncomfortable appearing).  HENT:  Head: Normocephalic and atraumatic.  Nose: Nose normal.  Mouth/Throat: Oropharynx is clear and moist.  Eyes: Conjunctivae and EOM are normal. Pupils are equal, round, and reactive to light.  Neck: Normal range of motion. Neck supple. No JVD present. No tracheal deviation present. No thyromegaly present.  Cardiovascular: Normal rate, regular rhythm, normal heart sounds and intact distal pulses.  Exam reveals no gallop and no friction rub.   No murmur heard. Pulmonary/Chest: Effort normal and breath sounds normal. No stridor. No respiratory distress. He has no wheezes. He has no rales. He exhibits no tenderness.  Abdominal: Soft. Bowel sounds are normal. He exhibits no distension and no mass. There is tenderness (epigastric pain). There is no rebound and no guarding.  Musculoskeletal: Normal range of motion. He exhibits no edema and no tenderness.  Lymphadenopathy:    He has no cervical adenopathy.  Neurological: He is alert and oriented to person, place, and time. He exhibits normal muscle tone. Coordination normal.  Skin: Skin is warm and dry. No rash noted. No erythema. No pallor.  Psychiatric: He has a normal mood and affect. His behavior is normal. Judgment and thought content normal.    ED Course  Procedures (including critical care time)  Labs Reviewed  CBC -  Abnormal; Notable for the following:    Hemoglobin 17.8 (*)     MCHC 36.3 (*)     All other components within normal limits  BASIC METABOLIC PANEL - Abnormal; Notable for the following:    Potassium 3.4 (*)     Glucose, Bld 138 (*)     All other components within normal limits  URINALYSIS, ROUTINE W REFLEX MICROSCOPIC - Abnormal; Notable for the following:    Color, Urine AMBER (*)  BIOCHEMICALS MAY BE AFFECTED BY COLOR   APPearance CLOUDY (*)     Bilirubin Urine SMALL (*)     Ketones, ur 15 (*)     All other components  within normal limits  HEPATIC FUNCTION PANEL - Abnormal; Notable for the following:    AST 880 (*)     ALT 348 (*)     Alkaline Phosphatase 211 (*)     Total Bilirubin 1.7 (*)     Bilirubin, Direct 1.2 (*)     All other components within normal limits  LIPASE, BLOOD - Abnormal; Notable for the following:    Lipase 61 (*)     All other components within normal limits  POCT I-STAT TROPONIN I  PROTIME-INR  APTT  ACETAMINOPHEN LEVEL  HEPATITIS PANEL, ACUTE  HEMOGLOBIN A1C   US Abdomen Complete  11/08/2011  *RADIOLOGY REPORT*  Clinical Data:  Abnormal liver function studies.  COMPLETE ABDOMINAL ULTRASOUND  Comparison:  CT abdomen and pelvis 02/24/2010  Findings:  Gallbladder:  Surgically absent.  There appears to be a surgical clip in the gallbladder fossa.  Common bile duct:  Extrahepatic common bile ducts measure up to about 16 mm diameter.  This likely represents postoperative physiologic dilatation.  No common duct stones are visualized.  Liver:  Diffuse heterogeneous increased hepatic parenchymal echotexture suggesting fatty infiltration.  The no focal lesions are identified.  Limited color flow Doppler images of the main portal vein show flow in the appropriate directions.  IVC:  Appears normal.  Pancreas:  Pancreas is mostly obscured by overlying bowel gas and is poorly visualized.  Spleen:  Spleen length measures 9.8 cm.  Normal parenchymal echotexture.  Right Kidney:  Right kidney measures 12.2 cm length.  No hydronephrosis.  Left Kidney:  Left kidney measures 12.1 cm length.  No hydronephrosis.  Abdominal aorta:  Limited visualization.  No aneurysm identified in the visualized areas.  IMPRESSION: Surgical absence of the gallbladder.  Moderate extrahepatic bile duct dilatation is likely due to postoperative physiology.  Diffuse fatty infiltration of the liver.   Original Report Authenticated By: Marlon Pel, M.D.      1. Abdominal pain   2. Elevated liver enzymes   3. Hepatitis     4. Hyperglycemia       MDM  41 year old male with epigastric pain noted to have significantly elevated transaminases. Patient has had his gallbladder removed some time ago. Will discuss the hospitalist for admission for probable hepatitis     Date: 11/07/2011  Rate: 59  Rhythm: sinus bradycardia  QRS Axis: normal  Intervals: normal  ST/T Wave abnormalities: normal  Conduction Disutrbances:none  Narrative Interpretation:   Old EKG Reviewed: none available      Olivia Mackie, MD 11/08/11 4098  Olivia Mackie, MD 12/06/11 1100

## 2011-11-08 NOTE — Care Management Note (Signed)
    Page 1 of 1   11/08/2011     3:50:21 PM   CARE MANAGEMENT NOTE 11/08/2011  Patient:  Dustin Hansen, Dustin Hansen   Account Number:  [id number]  Date Initiated:  11/08/2011  Documentation initiated by:  Jim Like  Subjective/Objective Assessment:   Pt is a 41 yr old admitted with complaints of epigastric pain and elevated liver enzymes.     Action/Plan:   Continue to follow for CM/discharge planning needs   Anticipated DC Date:  11/12/2011   Anticipated DC Plan:  HOME/SELF CARE      DC Planning Services  CM consult      Choice offered to / List presented to:             Status of service:  In process, will continue to follow Medicare Important Message given?   (If response is "NO", the following Medicare IM given date fields will be blank) Date Medicare IM given:   Date Additional Medicare IM given:    Discharge Disposition:    Per UR Regulation:  Reviewed for med. necessity/level of care/duration of stay  If discussed at Long Length of Stay Meetings, dates discussed:    Comments:

## 2011-11-08 NOTE — Progress Notes (Signed)
INITIAL ADULT NUTRITION ASSESSMENT Date: 11/08/2011   Time: 4:38 PM Reason for Assessment: MST  ASSESSMENT: Male 41 y.o.  Dx: Hepatitis  Hx:  Past Medical History  Diagnosis Date  . Back pain, chronic   . Hypertension   . AAA (abdominal aortic aneurysm) 10/2010    4.1cm on CT scan  . Myocardial infarction     "think so; when I was young; used to go to heart dr"  . Pneumonia   . Chronic bronchitis 11/08/2011    "get it q month"  . Shortness of breath 11/08/2011    "lying down"  . Mental disorder 11/08/2011    "I get crazy; I take medicine"  . Depression   . Anxiety   . Bipolar 1 disorder   . Schizo-affective schizophrenia   . GERD (gastroesophageal reflux disease)   . History of stomach ulcers 11/08/2011  . Hepatitis 11/08/2011    "not sure which one"  . Daily headache 11/08/2011  . Stroke 11/08/2011    points to right chest  . 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 Burning with urination 11/08/2011  . Arthritis 11/08/2011    "feet and knees"    Related Meds:  Scheduled Meds:   . fentaNYL  100 mcg Intravenous Once  . folic acid  1 mg Oral Daily  .  morphine injection  4 mg Intravenous Once  . multivitamin with minerals  1 tablet Oral Daily  . ondansetron  4 mg Intravenous Once  . pneumococcal 23 valent vaccine  0.5 mL Intramuscular Tomorrow-1000  . thiamine  100 mg Oral Daily   Continuous Infusions:   . sodium chloride 175 mL/hr at 11/08/11 0630   PRN Meds:.HYDROmorphone (DILAUDID) injection, LORazepam, LORazepam  Ht:  5'7" (estimated)  Wt:  181 lbs per bedscale  Ideal Wt:    67.2 % Ideal Wt: 122%  Usual Wt: 200 lbs per chart review (01/2011), pt unsure % Usual Wt: 90.5%  There is no height or weight on file to calculate BMI.  Food/Nutrition Related Hx: unable to eat due to pain  Labs:  CMP     Component Value Date/Time   NA 141 11/07/2011 2300   K 3.4* 11/07/2011 2300   CL 101 11/07/2011 2300   CO2 27 11/07/2011 2300   GLUCOSE 138* 11/07/2011 2300     BUN 8 11/07/2011 2300   CREATININE 0.85 11/07/2011 2300   CALCIUM 9.3 11/07/2011 2300   PROT 7.0 11/07/2011 2300   ALBUMIN 4.0 11/07/2011 2300   AST 880* 11/07/2011 2300   ALT 348* 11/07/2011 2300   ALKPHOS 211* 11/07/2011 2300   BILITOT 1.7* 11/07/2011 2300   GFRNONAA >90 11/07/2011 2300   GFRAA >90 11/07/2011 2300    CBC    Component Value Date/Time   WBC 9.1 11/07/2011 2300   RBC 5.38 11/07/2011 2300   HGB 17.8* 11/07/2011 2300   HCT 49.1 11/07/2011 2300   PLT 193 11/07/2011 2300   MCV 91.3 11/07/2011 2300   MCH 33.1 11/07/2011 2300   MCHC 36.3* 11/07/2011 2300   RDW 13.1 11/07/2011 2300   LYMPHSABS 2.7 02/12/2011 0958   MONOABS 0.7 02/12/2011 0958   EOSABS 0.2 02/12/2011 0958   BASOSABS 0.1 02/12/2011 0958   Intake: no meals yet Output:   Intake/Output Summary (Last 24 hours) at 11/08/11 1643 Last data filed at 11/08/11 1600  Gross per 24 hour  Intake 1662.5 ml  Output    200 ml  Net 1462.5 ml   Last  BM 8/20  Diet Order: Clear Liquid  Supplements/Tube Feeding: none at this time  IVF:    sodium chloride Last Rate: 175 mL/hr at 11/08/11 0630    Estimated Nutritional Needs:   Kcal: 1900-2060 Protein: 82-98g Fluid: >2.0 L/day  RD spoke with pt re:  Nutrition status via phone interpreter.  Pt reports he thinks he lost wt due to inability to eat r/t pain, however is unable to state how much, duration of wt loss, or his usual wt.  Pt requests pain medication.  Wt obtained via bed scale: 181 lbs.  Per chart review pt weighed 200 lbs in November 2012 which is consistent with estimated wt loss of 20 lbs.  Pt has lost ~9% of usual wt in unknown period of time.  Pt admitted with nausea, vomiting, elevated lipase. Lipase     Component Value Date/Time   LIPASE 61* 11/07/2011 2300    Per RN, diet to be held for evening procedure.  Will schedule supplements to start tomorrow.  Unable to dx with malnutrition at this time, will continue to assess nutrition status.  NUTRITION  DIAGNOSIS: Unintended wt loss  RELATED TO: abdominal pain  AS EVIDENCE BY: pt report of wt loss, wt decreased compared to last known wt  MONITORING/EVALUATION(Goals): 1.  Food/Beverage; diet advancement per MD discretion 2.  Wt/wt change; deter further loss  EDUCATION NEEDS: -No education needs identified at this time  INTERVENTION: 1.  Supplements; Resource Breeze BID between meals starting tomorrow.  Dietitian #: [id number]  DOCUMENTATION CODES Per approved criteria  -Not Applicable    Loyce Dys [facility] 11/08/2011, 4:38 PM

## 2011-11-08 NOTE — H&P (Signed)
[facility] History and Physical  Dustin Hansen WUJ:[id number] DOB: [birth year] DOA: 11/08/2011  Referring physician: Norlene Campbell PCP: Arelia Sneddon, MD   Chief Complaint: abdominal pain  HPI:  41 yo male with etoh dependence has c/o abdominal pain.  Epigastric, sharp. + n/v,  Denies diarrhea, constipation, brbpr, black stool.  Pt notes that the pain radiates to the back.  Pt presented to the ED and found to have abnormal liver function and pancreatitis.  Pt will be admitted for abnormal lft and pancreatitis  Review of Systems:  Negative for fever, chills, negative for all organ systems except for + above  Past Medical History  Diagnosis Date  . Back pain, chronic   . Hypertension   . AAA (abdominal aortic aneurysm) 10/2010    4.1cm on CT scan   Past Surgical History  Procedure Date  . Cholecystectomy    Social History:  reports that he has been smoking.  He has never used smokeless tobacco. He reports that he drinks about 2.5 ounces of alcohol per week. He reports that he does not use illicit drugs. Lives at home, able to perform all ADLS    No Known Allergies  Family History  Problem Relation Age of Onset  . Hypertension Mother   (be sure to complete)  Prior to Admission medications   Medication Sig Start Date End Date Taking? Authorizing Provider  amLODipine (NORVASC) 10 MG tablet Take 0.5 tablets (5 mg total) by mouth daily. 02/04/11 02/04/12  Hilario Quarry, MD  ibuprofen (ADVIL,MOTRIN) 200 MG tablet Take 200 mg by mouth every 6 (six) hours as needed. For pain     Historical Provider, MD  lisinopril (PRINIVIL,ZESTRIL) 10 MG tablet Take 1 tablet (10 mg total) by mouth daily. 01/31/11 01/31/12  Dede Query, MD  pregabalin (LYRICA) 50 MG capsule Take 1 capsule (50 mg total) by mouth 3 (three) times daily. 02/12/11 02/12/12  Thomasene Lot, PA-C   Physical Exam: Filed Vitals:   11/07/11 2257 11/08/11 0432  BP: 153/105 153/96  Pulse: 60 59  Temp: 97.5 F (36.4 C) 97 F  (36.1 C)  TempSrc:  Oral  Resp: 20 16  SpO2: 98% 98%     General:  No acute distress  Eyes: anicteric  ENT: mmm  Neck: no jvd, no bruit, no tm, no adenopathy  Cardiovascular: rrr s1, s2, no m/g/r  Respiratory: ctab  Abdomen: soft, slightly tender, no guarding no rebound, +bs  Skin: no rash, + tatoo  Musculoskeletal: wnl  Psychiatric: no depression  Neurologic: nonfocal  Labs on Admission:  Basic Metabolic Panel:  Lab 11/07/11 9562  NA 141  K 3.4*  CL 101  CO2 27  GLUCOSE 138*  BUN 8  CREATININE 0.85  CALCIUM 9.3  MG --  PHOS --   Liver Function Tests:  Lab 11/07/11 2300  AST 880*  ALT 348*  ALKPHOS 211*  BILITOT 1.7*  PROT 7.0  ALBUMIN 4.0    Lab 11/07/11 2300  LIPASE 61*  AMYLASE --   No results found for this basename: AMMONIA:5 in the last 168 hours CBC:  Lab 11/07/11 2300  WBC 9.1  NEUTROABS --  HGB 17.8*  HCT 49.1  MCV 91.3  PLT 193   Cardiac Enzymes: No results found for this basename: CKTOTAL:5,CKMB:5,CKMBINDEX:5,TROPONINI:5 in the last 168 hours  BNP (last 3 results) No results found for this basename: PROBNP:3 in the last 8760 hours CBG: No results found for this basename: GLUCAP:5 in the last 168 hours  Radiological Exams on  Admission: No results found.  EKG:    Assessment/Plan Principal Problem:  *Hepatitis Active Problems:  Hyperglycemia   1. Acute hepatitis ? Secondary to etoh abuse, check acute hepatitis panel, ruq u/s 2. Pancreatitis: NPO, NS iv, pain control 3. Hyperglycemia: check hga1c 4. Hypokalemia: replete potassium 5. Etoh abuse:  Pt counselled on etoh cessation 6. Tobacco dep: pt counselled on smoking cessation x  Code Status: full code Family Communication:  Disposition Plan: home  Time spent: 45 minutes  Pearson Grippe [facility] Pager [phone]  If 7PM-7AM, please contact night-coverage [email or web address] Password [email or web address] 11/08/2011, 5:11 AM

## 2011-11-08 NOTE — ED Notes (Signed)
Patient reports no improvement in pain.

## 2011-11-09 DIAGNOSIS — F1210 Cannabis abuse, uncomplicated: Secondary | ICD-10-CM

## 2011-11-09 LAB — COMPREHENSIVE METABOLIC PANEL
AST: 683 U/L — ABNORMAL HIGH (ref 0–37)
Albumin: 3.1 g/dL — ABNORMAL LOW (ref 3.5–5.2)
BUN: <3 mg/dL — ABNORMAL LOW (ref 6–23)
Calcium: 8.7 mg/dL (ref 8.4–10.5)
Creatinine, Ser: 0.73 mg/dL (ref 0.50–1.35)
Total Protein: 5.8 g/dL — ABNORMAL LOW (ref 6.0–8.3)

## 2011-11-09 LAB — CBC
Hemoglobin: 15.5 g/dL (ref 13.0–17.0)
MCH: 32.5 pg (ref 26.0–34.0)
MCHC: 34.8 g/dL (ref 30.0–36.0)
Platelets: 168 10*3/uL (ref 150–400)
RDW: 13.2 % (ref 11.5–15.5)

## 2011-11-09 LAB — HEPATITIS PANEL, ACUTE
HCV Ab: NEGATIVE
Hep A IgM: NEGATIVE
Hep B C IgM: NEGATIVE
Hepatitis B Surface Ag: NEGATIVE

## 2011-11-09 LAB — PHOSPHORUS: Phosphorus: 3 mg/dL (ref 2.3–4.6)

## 2011-11-09 LAB — IRON AND TIBC: UIBC: <15 ug/dL — ABNORMAL LOW (ref 125–400)

## 2011-11-09 LAB — FERRITIN: Ferritin: 5046 ng/mL — ABNORMAL HIGH (ref 22–322)

## 2011-11-09 LAB — MAGNESIUM: Magnesium: 2 mg/dL (ref 1.5–2.5)

## 2011-11-09 NOTE — Progress Notes (Signed)
[facility] PROGRESS NOTE  Dustin Hansen EAV:[id number] DOB: 06/19/1970 DOA: 11/08/2011 PCP: Arelia Sneddon, MD  Assessment/Plan: Acute hepatitis-alochol vs other toxin vs. Biliary obstruction -hx of alcohol; Utox showed THC 8/21 -Vomiting-resolved, but still pain with meals -tolerating full liquids -Will try and hepatic panel  -previous viral hepatitis serologies showed immunity to Hep A and B, neg Hep C Abnormal MRCP -s/p chole -consult GI hyperlipasemia  -doubt pancreatitis -Lipase was only 61  -Previous alcohol use may have resulted in a burnt out pancreas which may only result in mild lipase elevations  Transaminasemia/fatty liver -worsen hyperbilirubinemia Abdominal pain -unclear if some degree of gastritis, PUD THC abuse   Procedures/Studies: US Abdomen Complete  11/08/2011  *RADIOLOGY REPORT*  Clinical Data:  Abnormal liver function studies.  COMPLETE ABDOMINAL ULTRASOUND  Comparison:  CT abdomen and pelvis 02/24/2010  Findings:  Gallbladder:  Surgically absent.  There appears to be a surgical clip in the gallbladder fossa.  Common bile duct:  Extrahepatic common bile ducts measure up to about 16 mm diameter.  This likely represents postoperative physiologic dilatation.  No common duct stones are visualized.  Liver:  Diffuse heterogeneous increased hepatic parenchymal echotexture suggesting fatty infiltration.  The no focal lesions are identified.  Limited color flow Doppler images of the main portal vein show flow in the appropriate directions.  IVC:  Appears normal.  Pancreas:  Pancreas is mostly obscured by overlying bowel gas and is poorly visualized.  Spleen:  Spleen length measures 9.8 cm.  Normal parenchymal echotexture.  Right Kidney:  Right kidney measures 12.2 cm length.  No hydronephrosis.  Left Kidney:  Left kidney measures 12.1 cm length.  No hydronephrosis.  Abdominal aorta:  Limited visualization.  No aneurysm identified in the visualized areas.  IMPRESSION:  Surgical absence of the gallbladder.  Moderate extrahepatic bile duct dilatation is likely due to postoperative physiology.  Diffuse fatty infiltration of the liver.   Original Report Authenticated By: Marlon Pel, M.D.    Mr 3d Recon At Scanner  11/09/2011  *RADIOLOGY REPORT*  Clinical Data:  Severe epigastric pain. Nausea and vomiting. Pancreatitis.  Elevated liver function test.  MRI ABDOMEN WITHOUT AND WITH CONTRAST (MRCP)  Technique:  Multiplanar multisequence MR imaging of the abdomen was performed without and with contrast, including heavily T2-weighted images of the biliary and pancreatic ducts.  Three-dimensional MR images were rendered by post processing of the original MR data.  Contrast: 19mL MULTIHANCE GADOBENATE DIMEGLUMINE 529 MG/ML IV SOLN  Comparison:   None  Findings:  Gallbladder surgically absent.  Mild dilatation of both intra and extrahepatic bile ducts is seen, with common bile duct measuring 10 mm in diameter. Abrupt tapering of the distal common bile duct is seen at the ampulla, but no definite meniscus sign is seen.  Differential diagnosis includes ampullary stenosis and occult impacted distal common bile duct stone.  There is no evidence of pancreatic ductal dilatation or pancreas divisum. No evidence of pancreatic soft tissue mass or peripancreatic inflammatory changes. Severe hepatic steatosis is demonstrated.  Dynamic postcontrast imaging is limited by motion artifact, but no liver masses are identified.  Spleen is normal in size in appearance.  Several tiny right renal cysts are seen, however there is no evidence of renal mass or hydronephrosis.  Both adrenal glands are normal appearance.  No lymphadenopathy identified.  No evidence of inflammatory process.  IMPRESSION:  1.  Mild biliary ductal dilatation with common bile duct measuring 10 mm.  Abrupt transition in tapering of the distal common  bile duct is seen at the ampulla.  Differential diagnosis includes ampullary  stenosis and an occult impacted distal common bile duct stone.  Consider ERCP for further evaluation. 2.  Severe hepatic steatosis. 3.   Normal appearance of pancreas.  No evidence of pancreatic mass, pancreas divisum, or pancreatic ductal dilatation.   Original Report Authenticated By: Danae Orleans, M.D.    Acute Abdominal Series  11/08/2011  *RADIOLOGY REPORT*  Clinical Data: Epigastric pain, nausea, vomiting, chills, cough. Smoker.  ACUTE ABDOMEN SERIES (ABDOMEN 2 VIEW & CHEST 1 VIEW)  Comparison: 02/24/2010  Findings: Slightly shallow inspiration.  Normal heart size and pulmonary vascularity.  No focal airspace consolidation in the lungs.  Slight fibrosis or atelectasis in the lung bases.  No blunting of costophrenic angles.  No pneumothorax.  Gas filled small bowel in the left upper quadrant are at the upper limits of normal caliber.  Localized ileus or early obstructive change could have this appearance.  Gas and stool scattered throughout the colon without distension.  No free intra-abdominal air.  No abnormal air fluid levels.  Surgical clips in the right upper quadrant.  Degenerative changes in the hips.  IMPRESSION: Nonspecific bowel gas pattern with borderline dilated small bowel loop in the left upper quadrant likely to represent ileus or enteritis.  Early obstruction is not entirely excluded.  Followup as clinically indicated.   Original Report Authenticated By: Marlon Pel, M.D.    Mr Abd W/wo Cm/mrcp  11/09/2011  *RADIOLOGY REPORT*  Clinical Data:  Severe epigastric pain. Nausea and vomiting. Pancreatitis.  Elevated liver function test.  MRI ABDOMEN WITHOUT AND WITH CONTRAST (MRCP)  Technique:  Multiplanar multisequence MR imaging of the abdomen was performed without and with contrast, including heavily T2-weighted images of the biliary and pancreatic ducts.  Three-dimensional MR images were rendered by post processing of the original MR data.  Contrast: 19mL MULTIHANCE GADOBENATE  DIMEGLUMINE 529 MG/ML IV SOLN  Comparison:   None  Findings:  Gallbladder surgically absent.  Mild dilatation of both intra and extrahepatic bile ducts is seen, with common bile duct measuring 10 mm in diameter. Abrupt tapering of the distal common bile duct is seen at the ampulla, but no definite meniscus sign is seen.  Differential diagnosis includes ampullary stenosis and occult impacted distal common bile duct stone.  There is no evidence of pancreatic ductal dilatation or pancreas divisum. No evidence of pancreatic soft tissue mass or peripancreatic inflammatory changes. Severe hepatic steatosis is demonstrated.  Dynamic postcontrast imaging is limited by motion artifact, but no liver masses are identified.  Spleen is normal in size in appearance.  Several tiny right renal cysts are seen, however there is no evidence of renal mass or hydronephrosis.  Both adrenal glands are normal appearance.  No lymphadenopathy identified.  No evidence of inflammatory process.  IMPRESSION:  1.  Mild biliary ductal dilatation with common bile duct measuring 10 mm.  Abrupt transition in tapering of the distal common bile duct is seen at the ampulla.  Differential diagnosis includes ampullary stenosis and an occult impacted distal common bile duct stone.  Consider ERCP for further evaluation. 2.  Severe hepatic steatosis. 3.   Normal appearance of pancreas.  No evidence of pancreatic mass, pancreas divisum, or pancreatic ductal dilatation.   Original Report Authenticated By: Danae Orleans, M.D.       Code Status: Full Family Communication: Pt at bedside Disposition Plan: Home when medically stable  Subjective: Patient walking halls.  Tolerates full liquids.  Wants to eat "meat".  Denies f/c,n/v/d.  No cp, sob, rash.  Objective: Filed Vitals:   11/09/11 0024 11/09/11 0605 11/09/11 1031 11/09/11 1419  BP: 128/76 112/90 135/87 134/98  Pulse: 63 69 64 78  Temp: 97.8 F (36.6 C) 98.1 F (36.7 C) 97.4 F (36.3 C)  97.9 F (36.6 C)  TempSrc: Oral Oral Oral Oral  Resp: 20 20 18 18   Weight:   85.775 kg (189 lb 1.6 oz)   SpO2: 97% 98% 94% 97%    Intake/Output Summary (Last 24 hours) at 11/09/11 1610 Last data filed at 11/09/11 1421  Gross per 24 hour  Intake 4439.58 ml  Output   1775 ml  Net 2664.58 ml   Weight change:  Exam:   General:  Pt is alert, follows commands appropriately, not in acute distress  HEENT: No icterus, No thrush, No neck mass, [place]/AT  Cardiovascular: Regular rate and rhythm, S1/S2, , no rubs, no gallops  Respiratory: Clear to auscultation bilaterally, no wheezing, no crackles, no rhonchi  Abdomen: Soft,epigastric tenderness, no guarding. non distended, bowel sounds present, no guarding  Extremities: No edema, no rash  Data Reviewed: Basic Metabolic Panel:  Lab 11/09/11 1610 11/07/11 2300  NA 140 141  K 4.5 3.4*  CL 106 101  CO2 26 27  GLUCOSE 99 138*  BUN <3* 8  CREATININE 0.73 0.85  CALCIUM 8.7 9.3  MG 2.0 --  PHOS 3.0 --   Liver Function Tests:  Lab 11/09/11 0527 11/07/11 2300  AST 683* 880*  ALT 574* 348*  ALKPHOS 212* 211*  BILITOT 6.9* 1.7*  PROT 5.8* 7.0  ALBUMIN 3.1* 4.0    Lab 11/07/11 2300  LIPASE 61*  AMYLASE --   No results found for this basename: AMMONIA:5 in the last 168 hours CBC:  Lab 11/09/11 0527 11/07/11 2300  WBC 5.6 9.1  NEUTROABS -- --  HGB 15.5 17.8*  HCT 44.5 49.1  MCV 93.3 91.3  PLT 168 193   Cardiac Enzymes:  Lab 11/08/11 2020 11/08/11 1452  CKTOTAL -- --  CKMB -- --  CKMBINDEX -- --  TROPONINI <0.30 <0.30   BNP: No components found with this basename: POCBNP:5 CBG: No results found for this basename: GLUCAP:5 in the last 168 hours  No results found for this or any previous visit (from the past 240 hour(s)).   Scheduled Meds:   . amLODipine  5 mg Oral Daily  . feeding supplement  1 Container Oral TID BM  . folic acid  1 mg Oral Daily  . gadobenate dimeglumine  19 mL Intravenous Once  .  lisinopril  10 mg Oral Daily  . multivitamin with minerals  1 tablet Oral Daily  . pneumococcal 23 valent vaccine  0.5 mL Intramuscular Tomorrow-1000  . pregabalin  50 mg Oral TID  . thiamine  100 mg Oral Daily   Continuous Infusions:   . sodium chloride 175 mL/hr at 11/09/11 0039     Dejanique Ruehl, DO  [facility] Pager [phone]  If 7PM-7AM, please contact night-coverage [email or web address] Password [email or web address] 11/09/2011, 4:10 PM   LOS: 1 day

## 2011-11-09 NOTE — Consult Note (Signed)
[facility] Consult Note  Referring Provider: No ref. provider found Primary Care Physician:  Arelia Sneddon, MD Primary Gastroenterologist:  Dr.  Antony Contras Complaint: Abdominal pain and elevated liver function tests. HPI: Dustin Hansen is an 41 y.o. [place] male  who presents with abdominal pain and elevated liver function tests. He speaks very little [place] and no interpreter is currently available. According to another physician his pain seems to be worse after eating and has been associated with some vomiting. He had an ultrasound which showed a dilated common bile duct and showed at surgical absence of the gallbladder. MRCP was ordered which showed dilatation of the common bile duct with abrupt narrowing at the ampulla possibly representing a distal impacted stone or papillary stenosis.  Past Medical History  Diagnosis Date  . Back pain, chronic   . Hypertension   . AAA (abdominal aortic aneurysm) 10/2010    4.1cm on CT scan  . Myocardial infarction     "think so; when I was young; used to go to heart dr"  . Pneumonia   . Chronic bronchitis 11/08/2011    "get it q month"  . Shortness of breath 11/08/2011    "lying down"  . Mental disorder 11/08/2011    "I get crazy; I take medicine"  . Depression   . Anxiety   . Bipolar 1 disorder   . Schizo-affective schizophrenia   . GERD (gastroesophageal reflux disease)   . History of stomach ulcers 11/08/2011  . Hepatitis 11/08/2011    "not sure which one"  . Daily headache 11/08/2011  . Stroke 11/08/2011    points to right chest  . 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 Burning with urination 11/08/2011  . Arthritis 11/08/2011    "feet and knees"    Past Surgical History  Procedure Date  . Cholecystectomy 2008    Medications Prior to Admission  Medication Sig Dispense Refill  . amLODipine (NORVASC) 10 MG tablet Take 0.5 tablets (5 mg total) by mouth daily.  10 tablet  0  . ibuprofen (ADVIL,MOTRIN) 200 MG tablet Take 200  mg by mouth every 6 (six) hours as needed. For pain       . lisinopril (PRINIVIL,ZESTRIL) 10 MG tablet Take 1 tablet (10 mg total) by mouth daily.  30 tablet  0  . pregabalin (LYRICA) 50 MG capsule Take 1 capsule (50 mg total) by mouth 3 (three) times daily.  45 capsule  0    Allergies: No Known Allergies  Family History  Problem Relation Age of Onset  . Hypertension Mother     Social History:  reports that he has been smoking Cigarettes.  He has a 14.5 pack-year smoking history. He has never used smokeless tobacco. He reports that he drinks about 3 ounces of alcohol per week. He reports that he uses illicit drugs.  Review of Systems: negative except not currently obtainable   Blood pressure 134/98, pulse 78, temperature 97.9 F (36.6 C), temperature source Oral, resp. rate 18, weight 85.775 kg (189 lb 1.6 oz), SpO2 97.00%. Head: Normocephalic, without obvious abnormality, atraumatic Neck: no adenopathy, no carotid bruit, no JVD, supple, symmetrical, trachea midline and thyroid not enlarged, symmetric, no tenderness/mass/nodules Resp: clear to auscultation bilaterally Cardio: regular rate and rhythm, S1, S2 normal, no murmur, click, rub or gallop GI: Abdomen appears tender in the epigastrium without guarding or rebound Extremities: extremities normal, atraumatic, no cyanosis or edema  Results for orders placed during the hospital encounter of 11/08/11 (from the past  48 hour(s))  CBC     Status: Abnormal   Collection Time   11/07/11 11:00 PM      Component Value Range Comment   WBC 9.1  4.0 - 10.5 K/uL    RBC 5.38  4.22 - 5.81 MIL/uL    Hemoglobin 17.8 (*) 13.0 - 17.0 g/dL    HCT 16.1  09.6 - 04.5 %    MCV 91.3  78.0 - 100.0 fL    MCH 33.1  26.0 - 34.0 pg    MCHC 36.3 (*) 30.0 - 36.0 g/dL    RDW 40.9  81.1 - 91.4 %    Platelets 193  150 - 400 K/uL   BASIC METABOLIC PANEL     Status: Abnormal   Collection Time   11/07/11 11:00 PM      Component Value Range Comment   Sodium 141   135 - 145 mEq/L    Potassium 3.4 (*) 3.5 - 5.1 mEq/L    Chloride 101  96 - 112 mEq/L    CO2 27  19 - 32 mEq/L    Glucose, Bld 138 (*) 70 - 99 mg/dL    BUN 8  6 - 23 mg/dL    Creatinine, Ser 7.82  0.50 - 1.35 mg/dL    Calcium 9.3  8.4 - 95.6 mg/dL    GFR calc non Af Amer >90  >90 mL/min    GFR calc Af Amer >90  >90 mL/min   HEPATIC FUNCTION PANEL     Status: Abnormal   Collection Time   11/07/11 11:00 PM      Component Value Range Comment   Total Protein 7.0  6.0 - 8.3 g/dL    Albumin 4.0  3.5 - 5.2 g/dL    AST 213 (*) 0 - 37 U/L    ALT 348 (*) 0 - 53 U/L    Alkaline Phosphatase 211 (*) 39 - 117 U/L    Total Bilirubin 1.7 (*) 0.3 - 1.2 mg/dL    Bilirubin, Direct 1.2 (*) 0.0 - 0.3 mg/dL    Indirect Bilirubin 0.5  0.3 - 0.9 mg/dL   LIPASE, BLOOD     Status: Abnormal   Collection Time   11/07/11 11:00 PM      Component Value Range Comment   Lipase 61 (*) 11 - 59 U/L   URINALYSIS, ROUTINE W REFLEX MICROSCOPIC     Status: Abnormal   Collection Time   11/07/11 11:11 PM      Component Value Range Comment   Color, Urine AMBER (*) YELLOW BIOCHEMICALS MAY BE AFFECTED BY COLOR   APPearance CLOUDY (*) CLEAR    Specific Gravity, Urine 1.020  1.005 - 1.030    pH 7.0  5.0 - 8.0    Glucose, UA NEGATIVE  NEGATIVE mg/dL    Hgb urine dipstick NEGATIVE  NEGATIVE    Bilirubin Urine SMALL (*) NEGATIVE    Ketones, ur 15 (*) NEGATIVE mg/dL    Protein, ur NEGATIVE  NEGATIVE mg/dL    Urobilinogen, UA 1.0  0.0 - 1.0 mg/dL    Nitrite NEGATIVE  NEGATIVE    Leukocytes, UA NEGATIVE  NEGATIVE MICROSCOPIC NOT DONE ON URINES WITH NEGATIVE PROTEIN, BLOOD, LEUKOCYTES, NITRITE, OR GLUCOSE <1000 mg/dL.  POCT I-STAT TROPONIN I     Status: Normal   Collection Time   11/07/11 11:41 PM      Component Value Range Comment   Troponin i, poc 0.00  0.00 - 0.08 ng/mL    Comment  3            PROTIME-INR     Status: Normal   Collection Time   11/08/11  4:40 AM      Component Value Range Comment   Prothrombin Time 13.4   11.6 - 15.2 seconds    INR 1.00  0.00 - 1.49   APTT     Status: Normal   Collection Time   11/08/11  4:40 AM      Component Value Range Comment   aPTT 26  24 - 37 seconds   HEPATITIS PANEL, ACUTE     Status: Normal   Collection Time   11/08/11  4:40 AM      Component Value Range Comment   Hepatitis B Surface Ag NEGATIVE  NEGATIVE    HCV Ab NEGATIVE  NEGATIVE    Hep A IgM NEGATIVE  NEGATIVE    Hep B C IgM NEGATIVE  NEGATIVE   ACETAMINOPHEN LEVEL     Status: Normal   Collection Time   11/08/11  4:40 AM      Component Value Range Comment   Acetaminophen (Tylenol), Serum <15.0  10 - 30 ug/mL   HEMOGLOBIN A1C     Status: Normal   Collection Time   11/08/11  4:40 AM      Component Value Range Comment   Hemoglobin A1C 5.5  <5.7 %    Mean Plasma Glucose 111  <117 mg/dL   IRON AND TIBC     Status: Abnormal   Collection Time   11/08/11  1:38 PM      Component Value Range Comment   Iron 262 (*) 42 - 135 ug/dL    TIBC NOT CALC  829 - 435 ug/dL TIBC and %SAT were not calculated due to the UIBC being <15.   Saturation Ratios NOT CALC  20 - 55 % TIBC and %SAT were not calculated due to the UIBC being <15.   UIBC <15 (*) 125 - 400 ug/dL   FERRITIN     Status: Abnormal   Collection Time   11/08/11  1:38 PM      Component Value Range Comment   Ferritin 5046 (*) 22 - 322 ng/mL   TROPONIN I     Status: Normal   Collection Time   11/08/11  2:52 PM      Component Value Range Comment   Troponin I <0.30  <0.30 ng/mL   URINE RAPID DRUG SCREEN (HOSP PERFORMED)     Status: Abnormal   Collection Time   11/08/11  4:03 PM      Component Value Range Comment   Opiates POSITIVE (*) NONE DETECTED    Cocaine NONE DETECTED  NONE DETECTED    Benzodiazepines NONE DETECTED  NONE DETECTED    Amphetamines NONE DETECTED  NONE DETECTED    Tetrahydrocannabinol POSITIVE (*) NONE DETECTED    Barbiturates NONE DETECTED  NONE DETECTED   TROPONIN I     Status: Normal   Collection Time   11/08/11  8:20 PM       Component Value Range Comment   Troponin I <0.30  <0.30 ng/mL   CBC     Status: Normal   Collection Time   11/09/11  5:27 AM      Component Value Range Comment   WBC 5.6  4.0 - 10.5 K/uL    RBC 4.77  4.22 - 5.81 MIL/uL    Hemoglobin 15.5  13.0 - 17.0 g/dL    HCT 44.5  39.0 - 52.0 %    MCV 93.3  78.0 - 100.0 fL    MCH 32.5  26.0 - 34.0 pg    MCHC 34.8  30.0 - 36.0 g/dL    RDW 09.8  11.9 - 14.7 %    Platelets 168  150 - 400 K/uL   MAGNESIUM     Status: Normal   Collection Time   11/09/11  5:27 AM      Component Value Range Comment   Magnesium 2.0  1.5 - 2.5 mg/dL   PHOSPHORUS     Status: Normal   Collection Time   11/09/11  5:27 AM      Component Value Range Comment   Phosphorus 3.0  2.3 - 4.6 mg/dL   COMPREHENSIVE METABOLIC PANEL     Status: Abnormal   Collection Time   11/09/11  5:27 AM      Component Value Range Comment   Sodium 140  135 - 145 mEq/L    Potassium 4.5  3.5 - 5.1 mEq/L    Chloride 106  96 - 112 mEq/L    CO2 26  19 - 32 mEq/L    Glucose, Bld 99  70 - 99 mg/dL    BUN <3 (*) 6 - 23 mg/dL    Creatinine, Ser 8.29  0.50 - 1.35 mg/dL    Calcium 8.7  8.4 - 56.2 mg/dL    Total Protein 5.8 (*) 6.0 - 8.3 g/dL    Albumin 3.1 (*) 3.5 - 5.2 g/dL    AST 130 (*) 0 - 37 U/L    ALT 574 (*) 0 - 53 U/L    Alkaline Phosphatase 212 (*) 39 - 117 U/L    Total Bilirubin 6.9 (*) 0.3 - 1.2 mg/dL    GFR calc non Af Amer >90  >90 mL/min    GFR calc Af Amer >90  >90 mL/min    US Abdomen Complete  11/08/2011  *RADIOLOGY REPORT*  Clinical Data:  Abnormal liver function studies.  COMPLETE ABDOMINAL ULTRASOUND  Comparison:  CT abdomen and pelvis 02/24/2010  Findings:  Gallbladder:  Surgically absent.  There appears to be a surgical clip in the gallbladder fossa.  Common bile duct:  Extrahepatic common bile ducts measure up to about 16 mm diameter.  This likely represents postoperative physiologic dilatation.  No common duct stones are visualized.  Liver:  Diffuse heterogeneous increased  hepatic parenchymal echotexture suggesting fatty infiltration.  The no focal lesions are identified.  Limited color flow Doppler images of the main portal vein show flow in the appropriate directions.  IVC:  Appears normal.  Pancreas:  Pancreas is mostly obscured by overlying bowel gas and is poorly visualized.  Spleen:  Spleen length measures 9.8 cm.  Normal parenchymal echotexture.  Right Kidney:  Right kidney measures 12.2 cm length.  No hydronephrosis.  Left Kidney:  Left kidney measures 12.1 cm length.  No hydronephrosis.  Abdominal aorta:  Limited visualization.  No aneurysm identified in the visualized areas.  IMPRESSION: Surgical absence of the gallbladder.  Moderate extrahepatic bile duct dilatation is likely due to postoperative physiology.  Diffuse fatty infiltration of the liver.   Original Report Authenticated By: Marlon Pel, M.D.    Mr 3d Recon At Scanner  11/09/2011  *RADIOLOGY REPORT*  Clinical Data:  Severe epigastric pain. Nausea and vomiting. Pancreatitis.  Elevated liver function test.  MRI ABDOMEN WITHOUT AND WITH CONTRAST (MRCP)  Technique:  Multiplanar multisequence MR imaging of the abdomen was performed without  and with contrast, including heavily T2-weighted images of the biliary and pancreatic ducts.  Three-dimensional MR images were rendered by post processing of the original MR data.  Contrast: 19mL MULTIHANCE GADOBENATE DIMEGLUMINE 529 MG/ML IV SOLN  Comparison:   None  Findings:  Gallbladder surgically absent.  Mild dilatation of both intra and extrahepatic bile ducts is seen, with common bile duct measuring 10 mm in diameter. Abrupt tapering of the distal common bile duct is seen at the ampulla, but no definite meniscus sign is seen.  Differential diagnosis includes ampullary stenosis and occult impacted distal common bile duct stone.  There is no evidence of pancreatic ductal dilatation or pancreas divisum. No evidence of pancreatic soft tissue mass or peripancreatic  inflammatory changes. Severe hepatic steatosis is demonstrated.  Dynamic postcontrast imaging is limited by motion artifact, but no liver masses are identified.  Spleen is normal in size in appearance.  Several tiny right renal cysts are seen, however there is no evidence of renal mass or hydronephrosis.  Both adrenal glands are normal appearance.  No lymphadenopathy identified.  No evidence of inflammatory process.  IMPRESSION:  1.  Mild biliary ductal dilatation with common bile duct measuring 10 mm.  Abrupt transition in tapering of the distal common bile duct is seen at the ampulla.  Differential diagnosis includes ampullary stenosis and an occult impacted distal common bile duct stone.  Consider ERCP for further evaluation. 2.  Severe hepatic steatosis. 3.   Normal appearance of pancreas.  No evidence of pancreatic mass, pancreas divisum, or pancreatic ductal dilatation.   Original Report Authenticated By: Danae Orleans, M.D.    Acute Abdominal Series  11/08/2011  *RADIOLOGY REPORT*  Clinical Data: Epigastric pain, nausea, vomiting, chills, cough. Smoker.  ACUTE ABDOMEN SERIES (ABDOMEN 2 VIEW & CHEST 1 VIEW)  Comparison: 02/24/2010  Findings: Slightly shallow inspiration.  Normal heart size and pulmonary vascularity.  No focal airspace consolidation in the lungs.  Slight fibrosis or atelectasis in the lung bases.  No blunting of costophrenic angles.  No pneumothorax.  Gas filled small bowel in the left upper quadrant are at the upper limits of normal caliber.  Localized ileus or early obstructive change could have this appearance.  Gas and stool scattered throughout the colon without distension.  No free intra-abdominal air.  No abnormal air fluid levels.  Surgical clips in the right upper quadrant.  Degenerative changes in the hips.  IMPRESSION: Nonspecific bowel gas pattern with borderline dilated small bowel loop in the left upper quadrant likely to represent ileus or enteritis.  Early obstruction is not  entirely excluded.  Followup as clinically indicated.   Original Report Authenticated By: Marlon Pel, M.D.    Mr Abd W/wo Cm/mrcp  11/09/2011  *RADIOLOGY REPORT*  Clinical Data:  Severe epigastric pain. Nausea and vomiting. Pancreatitis.  Elevated liver function test.  MRI ABDOMEN WITHOUT AND WITH CONTRAST (MRCP)  Technique:  Multiplanar multisequence MR imaging of the abdomen was performed without and with contrast, including heavily T2-weighted images of the biliary and pancreatic ducts.  Three-dimensional MR images were rendered by post processing of the original MR data.  Contrast: 19mL MULTIHANCE GADOBENATE DIMEGLUMINE 529 MG/ML IV SOLN  Comparison:   None  Findings:  Gallbladder surgically absent.  Mild dilatation of both intra and extrahepatic bile ducts is seen, with common bile duct measuring 10 mm in diameter. Abrupt tapering of the distal common bile duct is seen at the ampulla, but no definite meniscus sign is seen.  Differential  diagnosis includes ampullary stenosis and occult impacted distal common bile duct stone.  There is no evidence of pancreatic ductal dilatation or pancreas divisum. No evidence of pancreatic soft tissue mass or peripancreatic inflammatory changes. Severe hepatic steatosis is demonstrated.  Dynamic postcontrast imaging is limited by motion artifact, but no liver masses are identified.  Spleen is normal in size in appearance.  Several tiny right renal cysts are seen, however there is no evidence of renal mass or hydronephrosis.  Both adrenal glands are normal appearance.  No lymphadenopathy identified.  No evidence of inflammatory process.  IMPRESSION:  1.  Mild biliary ductal dilatation with common bile duct measuring 10 mm.  Abrupt transition in tapering of the distal common bile duct is seen at the ampulla.  Differential diagnosis includes ampullary stenosis and an occult impacted distal common bile duct stone.  Consider ERCP for further evaluation. 2.  Severe hepatic  steatosis. 3.   Normal appearance of pancreas.  No evidence of pancreatic mass, pancreas divisum, or pancreatic ductal dilatation.   Original Report Authenticated By: Danae Orleans, M.D.     Assessment: 1. Abdominal pain elevated liver function tests and dilated common bile duct unclear whether significant pathology or physiological from post cholecystectomy status. Plan:  Given the overall presentation, an obstructing lesion needs to be evaluated in would recommend ERCP. Limiting factor would be obtaining a translator to obtain consent. This does not seem to be available at present. Will try to obtain one can discuss in the morning and possibly proceed with procedure tomorrow. Latrelle Fuston C 11/09/2011, 4:26 PM
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
## 2011-11-10 ENCOUNTER — Encounter (HOSPITAL_COMMUNITY): Payer: Self-pay | Admitting: *Deleted

## 2011-11-10 DIAGNOSIS — F1210 Cannabis abuse, uncomplicated: Secondary | ICD-10-CM

## 2011-11-10 LAB — COMPREHENSIVE METABOLIC PANEL
ALT: 338 U/L — ABNORMAL HIGH (ref 0–53)
Alkaline Phosphatase: 175 U/L — ABNORMAL HIGH (ref 39–117)
BUN: 4 mg/dL — ABNORMAL LOW (ref 6–23)
Chloride: 104 mEq/L (ref 96–112)
GFR calc Af Amer: >90 mL/min (ref >90)
Glucose, Bld: 180 mg/dL — ABNORMAL HIGH (ref 70–99)
Potassium: 3.7 mEq/L (ref 3.5–5.1)
Sodium: 139 mEq/L (ref 135–145)
Total Bilirubin: 2.6 mg/dL — ABNORMAL HIGH (ref 0.3–1.2)
Total Protein: 5.4 g/dL — ABNORMAL LOW (ref 6.0–8.3)

## 2011-11-10 MED ORDER — ZOLPIDEM TARTRATE 5 MG PO TABS
10.0000 mg | ORAL_TABLET | Freq: Once | ORAL | Status: AC
  Administered 2011-11-10: 10 mg via ORAL
  Filled 2011-11-10: qty 2

## 2011-11-10 MED ORDER — ZOLPIDEM TARTRATE 5 MG PO TABS
5.0000 mg | ORAL_TABLET | Freq: Every evening | ORAL | Status: DC | PRN
Start: 2011-11-10 — End: 2011-11-14
  Administered 2011-11-10 – 2011-11-13 (×4): 5 mg via ORAL
  Filled 2011-11-10 (×4): qty 1

## 2011-11-10 NOTE — Progress Notes (Signed)
Patient ID: Dustin Hansen, male   DOB: 03-22-1970, 41 y.o.   MRN: [id number] [facility] Progress Note  Subjective: Through an interpreter the patient states his pain is present but improved from the last 2 days. He has had no further nausea or vomiting.  Objective: Vital signs in last 24 hours: Temp:  [97.4 F (36.3 C)-98.4 F (36.9 C)] 98.4 F (36.9 C) (08/23 0520) Pulse Rate:  [70-91] 76  (08/23 0520) Resp:  [18] 18  (08/23 0520) BP: (111-143)/(69-98) 127/97 mmHg (08/23 0520) SpO2:  [92 %-98 %] 92 % (08/23 0520) Weight change:    PE: Abdomen still tender in the epigastrium.  Lab Results: Results for orders placed during the hospital encounter of 11/08/11 (from the past 24 hour(s))  COMPREHENSIVE METABOLIC PANEL     Status: Abnormal   Collection Time   11/10/11  6:52 AM      Component Value Range   Sodium 139  135 - 145 mEq/L   Potassium 3.7  3.5 - 5.1 mEq/L   Chloride 104  96 - 112 mEq/L   CO2 26  19 - 32 mEq/L   Glucose, Bld 180 (*) 70 - 99 mg/dL   BUN 4 (*) 6 - 23 mg/dL   Creatinine, Ser 6.57  0.50 - 1.35 mg/dL   Calcium 8.7  8.4 - 84.6 mg/dL   Total Protein 5.4 (*) 6.0 - 8.3 g/dL   Albumin 2.9 (*) 3.5 - 5.2 g/dL   AST 962 (*) 0 - 37 U/L   ALT 338 (*) 0 - 53 U/L   Alkaline Phosphatase 175 (*) 39 - 117 U/L   Total Bilirubin 2.6 (*) 0.3 - 1.2 mg/dL   GFR calc non Af Amer >90  >90 mL/min   GFR calc Af Amer >90  >90 mL/min    Studies/Results: Mr 3d Recon At Scanner  11/09/2011  *RADIOLOGY REPORT*  Clinical Data:  Severe epigastric pain. Nausea and vomiting. Pancreatitis.  Elevated liver function test.  MRI ABDOMEN WITHOUT AND WITH CONTRAST (MRCP)  Technique:  Multiplanar multisequence MR imaging of the abdomen was performed without and with contrast, including heavily T2-weighted images of the biliary and pancreatic ducts.  Three-dimensional MR images were rendered by post processing of the original MR data.  Contrast: 19mL MULTIHANCE GADOBENATE DIMEGLUMINE 529  MG/ML IV SOLN  Comparison:   None  Findings:  Gallbladder surgically absent.  Mild dilatation of both intra and extrahepatic bile ducts is seen, with common bile duct measuring 10 mm in diameter. Abrupt tapering of the distal common bile duct is seen at the ampulla, but no definite meniscus sign is seen.  Differential diagnosis includes ampullary stenosis and occult impacted distal common bile duct stone.  There is no evidence of pancreatic ductal dilatation or pancreas divisum. No evidence of pancreatic soft tissue mass or peripancreatic inflammatory changes. Severe hepatic steatosis is demonstrated.  Dynamic postcontrast imaging is limited by motion artifact, but no liver masses are identified.  Spleen is normal in size in appearance.  Several tiny right renal cysts are seen, however there is no evidence of renal mass or hydronephrosis.  Both adrenal glands are normal appearance.  No lymphadenopathy identified.  No evidence of inflammatory process.  IMPRESSION:  1.  Mild biliary ductal dilatation with common bile duct measuring 10 mm.  Abrupt transition in tapering of the distal common bile duct is seen at the ampulla.  Differential diagnosis includes ampullary stenosis and an occult impacted distal common bile duct stone.  Consider ERCP  for further evaluation. 2.  Severe hepatic steatosis. 3.   Normal appearance of pancreas.  No evidence of pancreatic mass, pancreas divisum, or pancreatic ductal dilatation.   Original Report Authenticated By: Danae Orleans, M.D.    Mr Abd W/wo Cm/mrcp  11/09/2011  *RADIOLOGY REPORT*  Clinical Data:  Severe epigastric pain. Nausea and vomiting. Pancreatitis.  Elevated liver function test.  MRI ABDOMEN WITHOUT AND WITH CONTRAST (MRCP)  Technique:  Multiplanar multisequence MR imaging of the abdomen was performed without and with contrast, including heavily T2-weighted images of the biliary and pancreatic ducts.  Three-dimensional MR images were rendered by post processing of the  original MR data.  Contrast: 19mL MULTIHANCE GADOBENATE DIMEGLUMINE 529 MG/ML IV SOLN  Comparison:   None  Findings:  Gallbladder surgically absent.  Mild dilatation of both intra and extrahepatic bile ducts is seen, with common bile duct measuring 10 mm in diameter. Abrupt tapering of the distal common bile duct is seen at the ampulla, but no definite meniscus sign is seen.  Differential diagnosis includes ampullary stenosis and occult impacted distal common bile duct stone.  There is no evidence of pancreatic ductal dilatation or pancreas divisum. No evidence of pancreatic soft tissue mass or peripancreatic inflammatory changes. Severe hepatic steatosis is demonstrated.  Dynamic postcontrast imaging is limited by motion artifact, but no liver masses are identified.  Spleen is normal in size in appearance.  Several tiny right renal cysts are seen, however there is no evidence of renal mass or hydronephrosis.  Both adrenal glands are normal appearance.  No lymphadenopathy identified.  No evidence of inflammatory process.  IMPRESSION:  1.  Mild biliary ductal dilatation with common bile duct measuring 10 mm.  Abrupt transition in tapering of the distal common bile duct is seen at the ampulla.  Differential diagnosis includes ampullary stenosis and an occult impacted distal common bile duct stone.  Consider ERCP for further evaluation. 2.  Severe hepatic steatosis. 3.   Normal appearance of pancreas.  No evidence of pancreatic mass, pancreas divisum, or pancreatic ductal dilatation.   Original Report Authenticated By: Danae Orleans, M.D.       Assessment: 1. Abdominal pain with dilated common bile duct status post cholecystectomy in 2008 with elevated liver function tests. Whether there is a true source of biliary obstruction is not readily apparent by ultrasound and MRI but would presume obstructive process still proven otherwise.  Plan: 1. Would advise ERCP or possibly EUS with ERCP on standby for positive  findings. I explained the procedure of ERCP as best I could to the patient through an interpreter today including the risks. Communication was not optimal and the patient said that he is to scared to have the ERCP procedure done with mention of the risks even when explained that the risk of pancreatitis is approximately 5% and bleeding 1%. 2. Therefore will recheck liver function tests tomorrow, allow full liquid diet which she desires today and Dr. Dulce Sellar who can perform both an EUS and ERCP if necessary will try to readdress tomorrow.    Kailly Richoux C 11/10/2011, 10:42 AM

## 2011-11-10 NOTE — Progress Notes (Signed)
[facility] PROGRESS NOTE  Dustin Hansen RUE:[id number] DOB: 1970-06-23 DOA: 11/08/2011 PCP: Arelia Sneddon, MD  Assessment/Plan: Acute hepatitis-alochol vs other toxin vs. Biliary obstruction  -hx of alcohol; Utox showed THC 8/21  -Vomiting-resolved, but still pain with meals  -tolerating full liquids  -Will trend hepatic panel  -previous viral hepatitis serologies showed immunity to Hep A and B, neg Hep C  Abnormal MRCP  -s/p chole  -Appreciate Dr. Madilyn Fireman; await possible EUS and ERCP hyperlipasemia  -doubt pancreatitis  -Lipase was only 61  Transaminasemia/fatty liver  - hyperbilirubinemia  Abdominal pain  -unclear if some degree of gastritis, PUD  THC abuse   Procedures/Studies: US Abdomen Complete  11/08/2011  *RADIOLOGY REPORT*  Clinical Data:  Abnormal liver function studies.  COMPLETE ABDOMINAL ULTRASOUND  Comparison:  CT abdomen and pelvis 02/24/2010  Findings:  Gallbladder:  Surgically absent.  There appears to be a surgical clip in the gallbladder fossa.  Common bile duct:  Extrahepatic common bile ducts measure up to about 16 mm diameter.  This likely represents postoperative physiologic dilatation.  No common duct stones are visualized.  Liver:  Diffuse heterogeneous increased hepatic parenchymal echotexture suggesting fatty infiltration.  The no focal lesions are identified.  Limited color flow Doppler images of the main portal vein show flow in the appropriate directions.  IVC:  Appears normal.  Pancreas:  Pancreas is mostly obscured by overlying bowel gas and is poorly visualized.  Spleen:  Spleen length measures 9.8 cm.  Normal parenchymal echotexture.  Right Kidney:  Right kidney measures 12.2 cm length.  No hydronephrosis.  Left Kidney:  Left kidney measures 12.1 cm length.  No hydronephrosis.  Abdominal aorta:  Limited visualization.  No aneurysm identified in the visualized areas.  IMPRESSION: Surgical absence of the gallbladder.  Moderate extrahepatic bile duct  dilatation is likely due to postoperative physiology.  Diffuse fatty infiltration of the liver.   Original Report Authenticated By: Marlon Pel, M.D.    Mr 3d Recon At Scanner  11/09/2011  *RADIOLOGY REPORT*  Clinical Data:  Severe epigastric pain. Nausea and vomiting. Pancreatitis.  Elevated liver function test.  MRI ABDOMEN WITHOUT AND WITH CONTRAST (MRCP)  Technique:  Multiplanar multisequence MR imaging of the abdomen was performed without and with contrast, including heavily T2-weighted images of the biliary and pancreatic ducts.  Three-dimensional MR images were rendered by post processing of the original MR data.  Contrast: 19mL MULTIHANCE GADOBENATE DIMEGLUMINE 529 MG/ML IV SOLN  Comparison:   None  Findings:  Gallbladder surgically absent.  Mild dilatation of both intra and extrahepatic bile ducts is seen, with common bile duct measuring 10 mm in diameter. Abrupt tapering of the distal common bile duct is seen at the ampulla, but no definite meniscus sign is seen.  Differential diagnosis includes ampullary stenosis and occult impacted distal common bile duct stone.  There is no evidence of pancreatic ductal dilatation or pancreas divisum. No evidence of pancreatic soft tissue mass or peripancreatic inflammatory changes. Severe hepatic steatosis is demonstrated.  Dynamic postcontrast imaging is limited by motion artifact, but no liver masses are identified.  Spleen is normal in size in appearance.  Several tiny right renal cysts are seen, however there is no evidence of renal mass or hydronephrosis.  Both adrenal glands are normal appearance.  No lymphadenopathy identified.  No evidence of inflammatory process.  IMPRESSION:  1.  Mild biliary ductal dilatation with common bile duct measuring 10 mm.  Abrupt transition in tapering of the distal common bile duct is  seen at the ampulla.  Differential diagnosis includes ampullary stenosis and an occult impacted distal common bile duct stone.  Consider  ERCP for further evaluation. 2.  Severe hepatic steatosis. 3.   Normal appearance of pancreas.  No evidence of pancreatic mass, pancreas divisum, or pancreatic ductal dilatation.   Original Report Authenticated By: Danae Orleans, M.D.    Acute Abdominal Series  11/08/2011  *RADIOLOGY REPORT*  Clinical Data: Epigastric pain, nausea, vomiting, chills, cough. Smoker.  ACUTE ABDOMEN SERIES (ABDOMEN 2 VIEW & CHEST 1 VIEW)  Comparison: 02/24/2010  Findings: Slightly shallow inspiration.  Normal heart size and pulmonary vascularity.  No focal airspace consolidation in the lungs.  Slight fibrosis or atelectasis in the lung bases.  No blunting of costophrenic angles.  No pneumothorax.  Gas filled small bowel in the left upper quadrant are at the upper limits of normal caliber.  Localized ileus or early obstructive change could have this appearance.  Gas and stool scattered throughout the colon without distension.  No free intra-abdominal air.  No abnormal air fluid levels.  Surgical clips in the right upper quadrant.  Degenerative changes in the hips.  IMPRESSION: Nonspecific bowel gas pattern with borderline dilated small bowel loop in the left upper quadrant likely to represent ileus or enteritis.  Early obstruction is not entirely excluded.  Followup as clinically indicated.   Original Report Authenticated By: Marlon Pel, M.D.    Mr Abd W/wo Cm/mrcp  11/09/2011  *RADIOLOGY REPORT*  Clinical Data:  Severe epigastric pain. Nausea and vomiting. Pancreatitis.  Elevated liver function test.  MRI ABDOMEN WITHOUT AND WITH CONTRAST (MRCP)  Technique:  Multiplanar multisequence MR imaging of the abdomen was performed without and with contrast, including heavily T2-weighted images of the biliary and pancreatic ducts.  Three-dimensional MR images were rendered by post processing of the original MR data.  Contrast: 19mL MULTIHANCE GADOBENATE DIMEGLUMINE 529 MG/ML IV SOLN  Comparison:   None  Findings:  Gallbladder  surgically absent.  Mild dilatation of both intra and extrahepatic bile ducts is seen, with common bile duct measuring 10 mm in diameter. Abrupt tapering of the distal common bile duct is seen at the ampulla, but no definite meniscus sign is seen.  Differential diagnosis includes ampullary stenosis and occult impacted distal common bile duct stone.  There is no evidence of pancreatic ductal dilatation or pancreas divisum. No evidence of pancreatic soft tissue mass or peripancreatic inflammatory changes. Severe hepatic steatosis is demonstrated.  Dynamic postcontrast imaging is limited by motion artifact, but no liver masses are identified.  Spleen is normal in size in appearance.  Several tiny right renal cysts are seen, however there is no evidence of renal mass or hydronephrosis.  Both adrenal glands are normal appearance.  No lymphadenopathy identified.  No evidence of inflammatory process.  IMPRESSION:  1.  Mild biliary ductal dilatation with common bile duct measuring 10 mm.  Abrupt transition in tapering of the distal common bile duct is seen at the ampulla.  Differential diagnosis includes ampullary stenosis and an occult impacted distal common bile duct stone.  Consider ERCP for further evaluation. 2.  Severe hepatic steatosis. 3.   Normal appearance of pancreas.  No evidence of pancreatic mass, pancreas divisum, or pancreatic ductal dilatation.   Original Report Authenticated By: Danae Orleans, M.D.     Subjective: Patient continues to complain of abdominal pain. It is controlled with Dilaudid. He continues to ask for pain medicines every 4-5 hours. He denies any nausea, vomiting,  chest pain, shortness breath, headaches. He states that oral intake causes a "burning" sensation in his stomach area.  Objective: Filed Vitals:   11/10/11 0520 11/10/11 1043 11/10/11 1412 11/10/11 1454  BP: 127/97  131/86 152/89  Pulse: 76  72 75  Temp: 98.4 F (36.9 C)   97.8 F (36.6 C)  TempSrc: Oral   Oral    Resp: 18   18  Height:  5\' 6"  (1.676 m)    Weight:  85.775 kg (189 lb 1.6 oz)    SpO2: 92%   96%    Intake/Output Summary (Last 24 hours) at 11/10/11 2043 Last data filed at 11/10/11 1300  Gross per 24 hour  Intake 4685.42 ml  Output   2525 ml  Net 2160.42 ml   Weight change:  Exam:   General:  Pt is alert, follows commands appropriately, not in acute distress  HEENT: No icterus, No thrush, , [place]/AT  Cardiovascular: Regular rate and rhythm, S1/S2, , no rubs, no gallops  Respiratory: Clear to auscultation bilaterally, no wheezing, no crackles, no rhonchi  Abdomen: Soft, epigastric tenderness, non distended, bowel sounds present, no guarding  Extremities: No edema, no rashes or lymphangitis  Data Reviewed: Basic Metabolic Panel:  Lab 11/10/11 4132 11/09/11 0527 11/07/11 2300  NA 139 140 141  K 3.7 4.5 3.4*  CL 104 106 101  CO2 26 26 27   GLUCOSE 180* 99 138*  BUN 4* <3* 8  CREATININE 0.74 0.73 0.85  CALCIUM 8.7 8.7 9.3  MG -- 2.0 --  PHOS -- 3.0 --   Liver Function Tests:  Lab 11/10/11 0652 11/09/11 0527 11/07/11 2300  AST 211* 683* 880*  ALT 338* 574* 348*  ALKPHOS 175* 212* 211*  BILITOT 2.6* 6.9* 1.7*  PROT 5.4* 5.8* 7.0  ALBUMIN 2.9* 3.1* 4.0    Lab 11/07/11 2300  LIPASE 61*  AMYLASE --   No results found for this basename: AMMONIA:5 in the last 168 hours CBC:  Lab 11/09/11 0527 11/07/11 2300  WBC 5.6 9.1  NEUTROABS -- --  HGB 15.5 17.8*  HCT 44.5 49.1  MCV 93.3 91.3  PLT 168 193   Cardiac Enzymes:  Lab 11/08/11 2020 11/08/11 1452  CKTOTAL -- --  CKMB -- --  CKMBINDEX -- --  TROPONINI <0.30 <0.30   BNP: No components found with this basename: POCBNP:5 CBG:  Lab 11/10/11 1830  GLUCAP 101*    No results found for this or any previous visit (from the past 240 hour(s)).   Scheduled Meds:   . amLODipine  5 mg Oral Daily  . feeding supplement  1 Container Oral TID BM  . folic acid  1 mg Oral Daily  . lisinopril  10 mg Oral Daily   . multivitamin with minerals  1 tablet Oral Daily  . pneumococcal 23 valent vaccine  0.5 mL Intramuscular Tomorrow-1000  . pregabalin  50 mg Oral TID  . thiamine  100 mg Oral Daily  . zolpidem  10 mg Oral Once   Continuous Infusions:   . sodium chloride 175 mL/hr at 11/10/11 0700     Dustin Winkowski, DO  [facility] Pager [phone]  If 7PM-7AM, please contact night-coverage [email or web address] Password [facility] 11/10/2011, 8:43 PM   LOS: 2 days

## 2011-11-11 LAB — COMPREHENSIVE METABOLIC PANEL
ALT: 250 U/L — ABNORMAL HIGH (ref 0–53)
AST: 133 U/L — ABNORMAL HIGH (ref 0–37)
CO2: 25 mEq/L (ref 19–32)
Calcium: 9.4 mg/dL (ref 8.4–10.5)
Creatinine, Ser: 0.67 mg/dL (ref 0.50–1.35)
GFR calc Af Amer: >90 mL/min (ref >90)
GFR calc non Af Amer: >90 mL/min (ref >90)
Glucose, Bld: 143 mg/dL — ABNORMAL HIGH (ref 70–99)
Sodium: 139 mEq/L (ref 135–145)
Total Protein: 5.8 g/dL — ABNORMAL LOW (ref 6.0–8.3)

## 2011-11-11 LAB — CERULOPLASMIN: Ceruloplasmin: 24 mg/dL (ref 20–60)

## 2011-11-11 NOTE — Progress Notes (Signed)
Subjective: Pain present, but continues to slowly improve.  Objective: Vital signs in last 24 hours: Temp:  [97.3 F (36.3 C)-98.4 F (36.9 C)] 97.3 F (36.3 C) (08/24 0538) Pulse Rate:  [69-77] 69  (08/24 0538) Resp:  [18] 18  (08/24 0538) BP: (128-152)/(86-98) 128/87 mmHg (08/24 0538) SpO2:  [93 %-97 %] 93 % (08/24 0538) Weight:  [86.818 kg (191 lb 6.4 oz)] 86.818 kg (191 lb 6.4 oz) (08/24 0538) Weight change: -0 kg (-0 oz) Last BM Date: 11/10/11  PE: GEN:  NAD HEENT:  Slight scleral icterus ABD:  Soft, mild epigastric tenderness, hypoactive but present bowel sounds  Lab Results: CMP     Component Value Date/Time   NA 139 11/11/2011 0510   K 4.1 11/11/2011 0510   CL 103 11/11/2011 0510   CO2 25 11/11/2011 0510   GLUCOSE 143* 11/11/2011 0510   BUN 6 11/11/2011 0510   CREATININE 0.67 11/11/2011 0510   CALCIUM 9.4 11/11/2011 0510   PROT 5.8* 11/11/2011 0510   ALBUMIN 3.0* 11/11/2011 0510   AST 133* 11/11/2011 0510   ALT 250* 11/11/2011 0510   ALKPHOS 163* 11/11/2011 0510   BILITOT 1.5* 11/11/2011 0510   GFRNONAA >90 11/11/2011 0510   GFRAA >90 11/11/2011 0510   Assessment:  1.  Elevated LFTs, but downtrending.  No evidence of cholangitis or ongoing acute biliary obstruction. 2.  Epigastric abdominal pain, persists, but improving. 3.  Abnormal U/S + MRCP.  ? CBC stone.  Plan:  1.  Given persistent downtrending LFTs, I would not proceed straight with ERCP.  Moreover, patient is against this approach, as he is very wary of risks of procedure-related pancreatitis. 2.  Suggest endoscopic ultrasound with ERCP to follow only if choledocholithiasis is identified.  Patient will need to be carelinked to [facility] to have this procedure done.  Patient will require anesthesia for sedation, which will be available on Tuesday.  Will tentatively plan EUS/ERCP procedure for Tuesday 8/27, sooner if symptoms of cholangitis ensue. 3.  Will revisit Monday.   Freddy Jaksch 11/11/2011, 11:01  AM

## 2011-11-11 NOTE — Progress Notes (Addendum)
[facility] PROGRESS NOTE  Dustin Hansen ZOX:[id number] DOB: 1970-11-09 DOA: 11/08/2011   Assessment/Plan: Acute hepatitis-alochol vs other toxin vs. Biliary obstruction  -Found out today pt took Abilify, Cogentin, Risperdal at home--hold for now in light of hepatic impairment -hx of alcohol; Utox showed THC 11/08/11 -Interestingly, the patient reveals today that drinking milk,tea and eating bread  Helps his abdominal pain -tolerating full liquids  - LFTs trending down -previous viral hepatitis serologies showed immunity to Hep A and B, neg Hep C  Abnormal MRCP  -s/p chole  -await possible EUS and ERCP, tentatively planned for Tuesday hyperlipasemia  -doubt pancreatitis  -Lipase was only 61  Transaminasemia/fatty liver  - hyperbilirubinemia  Abdominal pain  -unclear if some degree of gastritis, PUD  THC abuse  Subjective:  patient is feeling better. He states that his pain still exists and bothers him at night. Interestingly, he states that drinking tea , milk and broth and eating bread he denies any vomiting, diarrhea, chest pain, shortness of breath, fevers, chills.  Objective: Filed Vitals:   11/10/11 1454 11/10/11 2147 11/11/11 0538 11/11/11 1415  BP: 152/89 140/98 128/87 130/82  Pulse: 75 77 69 70  Temp: 97.8 F (36.6 C) 98.4 F (36.9 C) 97.3 F (36.3 C) 98.1 F (36.7 C)  TempSrc: Oral Oral Oral   Resp: 18 18 18 20   Height:      Weight:   86.818 kg (191 lb 6.4 oz)   SpO2: 96% 97% 93% 95%    Intake/Output Summary (Last 24 hours) at 11/11/11 1830 Last data filed at 11/11/11 1100  Gross per 24 hour  Intake   5338 ml  Output   1350 ml  Net   3988 ml   Weight change: -0 kg (-0 oz)  Exam:  General: Alert, awake, oriented x3, in no acute distress.  HEENT:  NCAT , no icterus, no thrush  Heart: Regular rate and rhythm, without murmurs, rubs, gallops.  Lungs:  Foot auscultation. No wheezes or rhonchi Abdomen: Soft,  Epigastric pain, no guarding,nondistended,  positive bowel sounds Extremities: no cyanosis, edema, lymphangitis.   Data Reviewed: Basic Metabolic Panel:  Lab 11/11/11 8119 11/10/11 0652 11/09/11 0527 11/07/11 2300  NA 139 139 140 141  K 4.1 3.7 -- --  CL 103 104 106 101  CO2 25 26 26 27   GLUCOSE 143* 180* 99 138*  BUN 6 4* <3* 8  CREATININE 0.67 0.74 0.73 0.85  CALCIUM 9.4 8.7 8.7 9.3  MG -- -- 2.0 --  PHOS -- -- 3.0 --   Liver Function Tests:  Lab 11/11/11 0510 11/10/11 0652 11/09/11 0527 11/07/11 2300  AST 133* 211* 683* 880*  ALT 250* 338* 574* 348*  ALKPHOS 163* 175* 212* 211*  BILITOT 1.5* 2.6* 6.9* 1.7*  PROT 5.8* 5.4* 5.8* 7.0  ALBUMIN 3.0* 2.9* 3.1* 4.0    Lab 11/07/11 2300  LIPASE 61*  AMYLASE --   No results found for this basename: AMMONIA:5 in the last 168 hours CBC:  Lab 11/09/11 0527 11/07/11 2300  WBC 5.6 9.1  NEUTROABS -- --  HGB 15.5 17.8*  HCT 44.5 49.1  MCV 93.3 91.3  PLT 168 193   Cardiac Enzymes:  Lab 11/08/11 2020 11/08/11 1452  CKTOTAL -- --  CKMB -- --  CKMBINDEX -- --  TROPONINI <0.30 <0.30   BNP: No components found with this basename: POCBNP:5 CBG:  Lab 11/10/11 1830  GLUCAP 101*    No results found for this or any previous visit (from  the past 240 hour(s)).   Studies: US Abdomen Complete  11/08/2011  *RADIOLOGY REPORT*  Clinical Data:  Abnormal liver function studies.  COMPLETE ABDOMINAL ULTRASOUND  Comparison:  CT abdomen and pelvis 02/24/2010  Findings:  Gallbladder:  Surgically absent.  There appears to be a surgical clip in the gallbladder fossa.  Common bile duct:  Extrahepatic common bile ducts measure up to about 16 mm diameter.  This likely represents postoperative physiologic dilatation.  No common duct stones are visualized.  Liver:  Diffuse heterogeneous increased hepatic parenchymal echotexture suggesting fatty infiltration.  The no focal lesions are identified.  Limited color flow Doppler images of the main portal vein show flow in the appropriate  directions.  IVC:  Appears normal.  Pancreas:  Pancreas is mostly obscured by overlying bowel gas and is poorly visualized.  Spleen:  Spleen length measures 9.8 cm.  Normal parenchymal echotexture.  Right Kidney:  Right kidney measures 12.2 cm length.  No hydronephrosis.  Left Kidney:  Left kidney measures 12.1 cm length.  No hydronephrosis.  Abdominal aorta:  Limited visualization.  No aneurysm identified in the visualized areas.  IMPRESSION: Surgical absence of the gallbladder.  Moderate extrahepatic bile duct dilatation is likely due to postoperative physiology.  Diffuse fatty infiltration of the liver.   Original Report Authenticated By: Marlon Pel, M.D.    Mr 3d Recon At Scanner  11/09/2011  *RADIOLOGY REPORT*  Clinical Data:  Severe epigastric pain. Nausea and vomiting. Pancreatitis.  Elevated liver function test.  MRI ABDOMEN WITHOUT AND WITH CONTRAST (MRCP)  Technique:  Multiplanar multisequence MR imaging of the abdomen was performed without and with contrast, including heavily T2-weighted images of the biliary and pancreatic ducts.  Three-dimensional MR images were rendered by post processing of the original MR data.  Contrast: 19mL MULTIHANCE GADOBENATE DIMEGLUMINE 529 MG/ML IV SOLN  Comparison:   None  Findings:  Gallbladder surgically absent.  Mild dilatation of both intra and extrahepatic bile ducts is seen, with common bile duct measuring 10 mm in diameter. Abrupt tapering of the distal common bile duct is seen at the ampulla, but no definite meniscus sign is seen.  Differential diagnosis includes ampullary stenosis and occult impacted distal common bile duct stone.  There is no evidence of pancreatic ductal dilatation or pancreas divisum. No evidence of pancreatic soft tissue mass or peripancreatic inflammatory changes. Severe hepatic steatosis is demonstrated.  Dynamic postcontrast imaging is limited by motion artifact, but no liver masses are identified.  Spleen is normal in size in  appearance.  Several tiny right renal cysts are seen, however there is no evidence of renal mass or hydronephrosis.  Both adrenal glands are normal appearance.  No lymphadenopathy identified.  No evidence of inflammatory process.  IMPRESSION:  1.  Mild biliary ductal dilatation with common bile duct measuring 10 mm.  Abrupt transition in tapering of the distal common bile duct is seen at the ampulla.  Differential diagnosis includes ampullary stenosis and an occult impacted distal common bile duct stone.  Consider ERCP for further evaluation. 2.  Severe hepatic steatosis. 3.   Normal appearance of pancreas.  No evidence of pancreatic mass, pancreas divisum, or pancreatic ductal dilatation.   Original Report Authenticated By: Danae Orleans, M.D.    Acute Abdominal Series  11/08/2011  *RADIOLOGY REPORT*  Clinical Data: Epigastric pain, nausea, vomiting, chills, cough. Smoker.  ACUTE ABDOMEN SERIES (ABDOMEN 2 VIEW & CHEST 1 VIEW)  Comparison: 02/24/2010  Findings: Slightly shallow inspiration.  Normal heart size  and pulmonary vascularity.  No focal airspace consolidation in the lungs.  Slight fibrosis or atelectasis in the lung bases.  No blunting of costophrenic angles.  No pneumothorax.  Gas filled small bowel in the left upper quadrant are at the upper limits of normal caliber.  Localized ileus or early obstructive change could have this appearance.  Gas and stool scattered throughout the colon without distension.  No free intra-abdominal air.  No abnormal air fluid levels.  Surgical clips in the right upper quadrant.  Degenerative changes in the hips.  IMPRESSION: Nonspecific bowel gas pattern with borderline dilated small bowel loop in the left upper quadrant likely to represent ileus or enteritis.  Early obstruction is not entirely excluded.  Followup as clinically indicated.   Original Report Authenticated By: Marlon Pel, M.D.    Mr Abd W/wo Cm/mrcp  11/09/2011  *RADIOLOGY REPORT*  Clinical Data:   Severe epigastric pain. Nausea and vomiting. Pancreatitis.  Elevated liver function test.  MRI ABDOMEN WITHOUT AND WITH CONTRAST (MRCP)  Technique:  Multiplanar multisequence MR imaging of the abdomen was performed without and with contrast, including heavily T2-weighted images of the biliary and pancreatic ducts.  Three-dimensional MR images were rendered by post processing of the original MR data.  Contrast: 19mL MULTIHANCE GADOBENATE DIMEGLUMINE 529 MG/ML IV SOLN  Comparison:   None  Findings:  Gallbladder surgically absent.  Mild dilatation of both intra and extrahepatic bile ducts is seen, with common bile duct measuring 10 mm in diameter. Abrupt tapering of the distal common bile duct is seen at the ampulla, but no definite meniscus sign is seen.  Differential diagnosis includes ampullary stenosis and occult impacted distal common bile duct stone.  There is no evidence of pancreatic ductal dilatation or pancreas divisum. No evidence of pancreatic soft tissue mass or peripancreatic inflammatory changes. Severe hepatic steatosis is demonstrated.  Dynamic postcontrast imaging is limited by motion artifact, but no liver masses are identified.  Spleen is normal in size in appearance.  Several tiny right renal cysts are seen, however there is no evidence of renal mass or hydronephrosis.  Both adrenal glands are normal appearance.  No lymphadenopathy identified.  No evidence of inflammatory process.  IMPRESSION:  1.  Mild biliary ductal dilatation with common bile duct measuring 10 mm.  Abrupt transition in tapering of the distal common bile duct is seen at the ampulla.  Differential diagnosis includes ampullary stenosis and an occult impacted distal common bile duct stone.  Consider ERCP for further evaluation. 2.  Severe hepatic steatosis. 3.   Normal appearance of pancreas.  No evidence of pancreatic mass, pancreas divisum, or pancreatic ductal dilatation.   Original Report Authenticated By: Danae Orleans, M.D.       Scheduled Meds:   . amLODipine  5 mg Oral Daily  . feeding supplement  1 Container Oral TID BM  . folic acid  1 mg Oral Daily  . lisinopril  10 mg Oral Daily  . multivitamin with minerals  1 tablet Oral Daily  . pregabalin  50 mg Oral TID  . thiamine  100 mg Oral Daily   Continuous Infusions:   . sodium chloride 175 mL/hr at 11/11/11 0531    Catarina Hartshorn, DO  [facility] Pager [phone]  If 7PM-7AM, please contact night-coverage [email or web address] Password [facility] 11/11/2011, 6:30 PM

## 2011-11-12 LAB — COMPREHENSIVE METABOLIC PANEL
ALT: 204 U/L — ABNORMAL HIGH (ref 0–53)
Albumin: 3.2 g/dL — ABNORMAL LOW (ref 3.5–5.2)
Alkaline Phosphatase: 160 U/L — ABNORMAL HIGH (ref 39–117)
Chloride: 103 mEq/L (ref 96–112)
Potassium: 4.1 mEq/L (ref 3.5–5.1)
Sodium: 139 mEq/L (ref 135–145)
Total Bilirubin: 1.1 mg/dL (ref 0.3–1.2)
Total Protein: 6.1 g/dL (ref 6.0–8.3)

## 2011-11-12 NOTE — Progress Notes (Signed)
[facility] PROGRESS NOTE  Dustin Hansen ZOX:[id number] DOB: [birth year] DOA: 11/08/2011 PCP: Arelia Sneddon, MD  Assessment/Plan: Acute hepatitis-alochol vs other toxin vs. Biliary obstruction  -Found out today pt took Abilify, Cogentin, Risperdal at home--hold for now in light of hepatic impairment  -states pain is improving -hx of alcohol abuse; Utox showed THC 11/08/11  -Interestingly, the patient reveals today that drinking milk,tea and eating bread helps his abdominal pain  -tolerating full liquids  - LFTs trending down  -previous viral hepatitis serologies showed immunity to Hep A and B, neg Hep C  Abnormal MRCP  -s/p chole  -await possible EUS and ERCP, tentatively planned for Tuesday  Opioid Dependence -Review of outpatient records reveals innumerable number of visits into the emergency department for chronic back pain and abdominal pain requesting opioid medications. Transaminasemia/fatty liver  - hyperbilirubinemia  Abdominal pain  -has been chronic according to old records -unclear if some degree of gastritis, PUD  THC abuse   Procedures/Studies: US Abdomen Complete  11/08/2011  *RADIOLOGY REPORT*  Clinical Data:  Abnormal liver function studies.  COMPLETE ABDOMINAL ULTRASOUND  Comparison:  CT abdomen and pelvis 02/24/2010  Findings:  Gallbladder:  Surgically absent.  There appears to be a surgical clip in the gallbladder fossa.  Common bile duct:  Extrahepatic common bile ducts measure up to about 16 mm diameter.  This likely represents postoperative physiologic dilatation.  No common duct stones are visualized.  Liver:  Diffuse heterogeneous increased hepatic parenchymal echotexture suggesting fatty infiltration.  The no focal lesions are identified.  Limited color flow Doppler images of the main portal vein show flow in the appropriate directions.  IVC:  Appears normal.  Pancreas:  Pancreas is mostly obscured by overlying bowel gas and is poorly visualized.  Spleen:   Spleen length measures 9.8 cm.  Normal parenchymal echotexture.  Right Kidney:  Right kidney measures 12.2 cm length.  No hydronephrosis.  Left Kidney:  Left kidney measures 12.1 cm length.  No hydronephrosis.  Abdominal aorta:  Limited visualization.  No aneurysm identified in the visualized areas.  IMPRESSION: Surgical absence of the gallbladder.  Moderate extrahepatic bile duct dilatation is likely due to postoperative physiology.  Diffuse fatty infiltration of the liver.   Original Report Authenticated By: Marlon Pel, M.D.    Mr 3d Recon At Scanner  11/09/2011  *RADIOLOGY REPORT*  Clinical Data:  Severe epigastric pain. Nausea and vomiting. Pancreatitis.  Elevated liver function test.  MRI ABDOMEN WITHOUT AND WITH CONTRAST (MRCP)  Technique:  Multiplanar multisequence MR imaging of the abdomen was performed without and with contrast, including heavily T2-weighted images of the biliary and pancreatic ducts.  Three-dimensional MR images were rendered by post processing of the original MR data.  Contrast: 19mL MULTIHANCE GADOBENATE DIMEGLUMINE 529 MG/ML IV SOLN  Comparison:   None  Findings:  Gallbladder surgically absent.  Mild dilatation of both intra and extrahepatic bile ducts is seen, with common bile duct measuring 10 mm in diameter. Abrupt tapering of the distal common bile duct is seen at the ampulla, but no definite meniscus sign is seen.  Differential diagnosis includes ampullary stenosis and occult impacted distal common bile duct stone.  There is no evidence of pancreatic ductal dilatation or pancreas divisum. No evidence of pancreatic soft tissue mass or peripancreatic inflammatory changes. Severe hepatic steatosis is demonstrated.  Dynamic postcontrast imaging is limited by motion artifact, but no liver masses are identified.  Spleen is normal in size in appearance.  Several tiny right renal cysts are  seen, however there is no evidence of renal mass or hydronephrosis.  Both adrenal glands  are normal appearance.  No lymphadenopathy identified.  No evidence of inflammatory process.  IMPRESSION:  1.  Mild biliary ductal dilatation with common bile duct measuring 10 mm.  Abrupt transition in tapering of the distal common bile duct is seen at the ampulla.  Differential diagnosis includes ampullary stenosis and an occult impacted distal common bile duct stone.  Consider ERCP for further evaluation. 2.  Severe hepatic steatosis. 3.   Normal appearance of pancreas.  No evidence of pancreatic mass, pancreas divisum, or pancreatic ductal dilatation.   Original Report Authenticated By: Danae Orleans, M.D.    Acute Abdominal Series  11/08/2011  *RADIOLOGY REPORT*  Clinical Data: Epigastric pain, nausea, vomiting, chills, cough. Smoker.  ACUTE ABDOMEN SERIES (ABDOMEN 2 VIEW & CHEST 1 VIEW)  Comparison: 02/24/2010  Findings: Slightly shallow inspiration.  Normal heart size and pulmonary vascularity.  No focal airspace consolidation in the lungs.  Slight fibrosis or atelectasis in the lung bases.  No blunting of costophrenic angles.  No pneumothorax.  Gas filled small bowel in the left upper quadrant are at the upper limits of normal caliber.  Localized ileus or early obstructive change could have this appearance.  Gas and stool scattered throughout the colon without distension.  No free intra-abdominal air.  No abnormal air fluid levels.  Surgical clips in the right upper quadrant.  Degenerative changes in the hips.  IMPRESSION: Nonspecific bowel gas pattern with borderline dilated small bowel loop in the left upper quadrant likely to represent ileus or enteritis.  Early obstruction is not entirely excluded.  Followup as clinically indicated.   Original Report Authenticated By: Marlon Pel, M.D.    Mr Abd W/wo Cm/mrcp  11/09/2011  *RADIOLOGY REPORT*  Clinical Data:  Severe epigastric pain. Nausea and vomiting. Pancreatitis.  Elevated liver function test.  MRI ABDOMEN WITHOUT AND WITH CONTRAST (MRCP)   Technique:  Multiplanar multisequence MR imaging of the abdomen was performed without and with contrast, including heavily T2-weighted images of the biliary and pancreatic ducts.  Three-dimensional MR images were rendered by post processing of the original MR data.  Contrast: 19mL MULTIHANCE GADOBENATE DIMEGLUMINE 529 MG/ML IV SOLN  Comparison:   None  Findings:  Gallbladder surgically absent.  Mild dilatation of both intra and extrahepatic bile ducts is seen, with common bile duct measuring 10 mm in diameter. Abrupt tapering of the distal common bile duct is seen at the ampulla, but no definite meniscus sign is seen.  Differential diagnosis includes ampullary stenosis and occult impacted distal common bile duct stone.  There is no evidence of pancreatic ductal dilatation or pancreas divisum. No evidence of pancreatic soft tissue mass or peripancreatic inflammatory changes. Severe hepatic steatosis is demonstrated.  Dynamic postcontrast imaging is limited by motion artifact, but no liver masses are identified.  Spleen is normal in size in appearance.  Several tiny right renal cysts are seen, however there is no evidence of renal mass or hydronephrosis.  Both adrenal glands are normal appearance.  No lymphadenopathy identified.  No evidence of inflammatory process.  IMPRESSION:  1.  Mild biliary ductal dilatation with common bile duct measuring 10 mm.  Abrupt transition in tapering of the distal common bile duct is seen at the ampulla.  Differential diagnosis includes ampullary stenosis and an occult impacted distal common bile duct stone.  Consider ERCP for further evaluation. 2.  Severe hepatic steatosis. 3.   Normal appearance  of pancreas.  No evidence of pancreatic mass, pancreas divisum, or pancreatic ductal dilatation.   Original Report Authenticated By: Danae Orleans, M.D.     Code Status: Full Family Communication: Pt at bedside Disposition Plan: Home when medically stable  Subjective: She continues  to complain of epigastric discomfort. He is able to tolerate a liquid diet. No vomiting or diarrhea noted. Denies any fevers or chills chest pain or shortness of breath.  Objective: Filed Vitals:   11/11/11 1415 11/11/11 2126 11/12/11 0608 11/12/11 1400  BP: 130/82 131/89 121/88 127/80  Pulse: 70 65 62 78  Temp: 98.1 F (36.7 C) 98.4 F (36.9 C) 98.3 F (36.8 C) 97.4 F (36.3 C)  TempSrc:  Oral Oral Oral  Resp: 20 18 16 18   Height:      Weight:   87.1 kg (192 lb 0.3 oz)   SpO2: 95% 97% 97% 95%    Intake/Output Summary (Last 24 hours) at 11/12/11 1744 Last data filed at 11/12/11 1400  Gross per 24 hour  Intake   2665 ml  Output   1400 ml  Net   1265 ml   Weight change: 1.325 kg (2 lb 14.7 oz) Exam:   General:  Pt is alert, follows commands appropriately, not in acute distress  HEENT: No icterus, No thrush,  [place]/AT  Cardiovascular: Regular rate and rhythm, S1/S2, , no rubs, no gallops  Respiratory: Clear to auscultation bilaterally, no wheezing, no crackles, no rhonchi  Abdomen: Soft, epigastric tender, no guarding, non distended, bowel sounds present, no guarding  Extremities: No edema, no rashes, no lymphangitis  Data Reviewed: Basic Metabolic Panel:  Lab 11/12/11 7829 11/11/11 0510 11/10/11 0652 11/09/11 0527 11/07/11 2300  NA 139 139 139 140 141  K 4.1 4.1 3.7 4.5 3.4*  CL 103 103 104 106 101  CO2 25 25 26 26 27   GLUCOSE 118* 143* 180* 99 138*  BUN 6 6 4* <3* 8  CREATININE 0.66 0.67 0.74 0.73 0.85  CALCIUM 9.3 9.4 8.7 8.7 9.3  MG -- -- -- 2.0 --  PHOS -- -- -- 3.0 --   Liver Function Tests:  Lab 11/12/11 0550 11/11/11 0510 11/10/11 0652 11/09/11 0527 11/07/11 2300  AST 107* 133* 211* 683* 880*  ALT 204* 250* 338* 574* 348*  ALKPHOS 160* 163* 175* 212* 211*  BILITOT 1.1 1.5* 2.6* 6.9* 1.7*  PROT 6.1 5.8* 5.4* 5.8* 7.0  ALBUMIN 3.2* 3.0* 2.9* 3.1* 4.0    Lab 11/07/11 2300  LIPASE 61*  AMYLASE --   No results found for this basename: AMMONIA:5 in  the last 168 hours CBC:  Lab 11/09/11 0527 11/07/11 2300  WBC 5.6 9.1  NEUTROABS -- --  HGB 15.5 17.8*  HCT 44.5 49.1  MCV 93.3 91.3  PLT 168 193   Cardiac Enzymes:  Lab 11/08/11 2020 11/08/11 1452  CKTOTAL -- --  CKMB -- --  CKMBINDEX -- --  TROPONINI <0.30 <0.30   BNP: No components found with this basename: POCBNP:5 CBG:  Lab 11/10/11 1830  GLUCAP 101*    No results found for this or any previous visit (from the past 240 hour(s)).   Scheduled Meds:   . amLODipine  5 mg Oral Daily  . feeding supplement  1 Container Oral TID BM  . folic acid  1 mg Oral Daily  . lisinopril  10 mg Oral Daily  . multivitamin with minerals  1 tablet Oral Daily  . pregabalin  50 mg Oral TID  .  thiamine  100 mg Oral Daily   Continuous Infusions:   . sodium chloride 175 mL/hr at 11/12/11 0025     Dustin Yanik, DO  [facility] Pager [phone]  If 7PM-7AM, please contact night-coverage [email or web address] Password [facility] 11/12/2011, 5:44 PM   LOS: 4 days

## 2011-11-13 LAB — CBC
HCT: 46.9 % (ref 39.0–52.0)
Hemoglobin: 16 g/dL (ref 13.0–17.0)
MCH: 32.5 pg (ref 26.0–34.0)
MCHC: 34.1 g/dL (ref 30.0–36.0)
RDW: 13.7 % (ref 11.5–15.5)

## 2011-11-13 LAB — HEPATIC FUNCTION PANEL
Albumin: 3.2 g/dL — ABNORMAL LOW (ref 3.5–5.2)
Alkaline Phosphatase: 147 U/L — ABNORMAL HIGH (ref 39–117)
Indirect Bilirubin: 0.5 mg/dL (ref 0.3–0.9)
Total Bilirubin: 0.9 mg/dL (ref 0.3–1.2)
Total Protein: 6.2 g/dL (ref 6.0–8.3)

## 2011-11-13 LAB — HIV ANTIBODY (ROUTINE TESTING W REFLEX): HIV: NONREACTIVE

## 2011-11-13 MED ORDER — CIPROFLOXACIN IN D5W 400 MG/200ML IV SOLN
400.0000 mg | Freq: Once | INTRAVENOUS | Status: DC
  Filled 2011-11-13: qty 200

## 2011-11-13 MED ORDER — CIPROFLOXACIN IN D5W 400 MG/200ML IV SOLN
400.0000 mg | Freq: Once | INTRAVENOUS | Status: AC
  Administered 2011-11-14: 400 mg via INTRAVENOUS
  Filled 2011-11-13: qty 200

## 2011-11-13 MED ORDER — SODIUM CHLORIDE 0.9 % IV SOLN
INTRAVENOUS | Status: DC
  Administered 2011-11-13: 16:00:00 via INTRAVENOUS

## 2011-11-13 NOTE — Progress Notes (Signed)
Subjective: Pain improving, but not resolved.  Objective: Vital signs in last 24 hours: Temp:  [97.4 F (36.3 C)-98.5 F (36.9 C)] 98.5 F (36.9 C) (08/26 0500) Pulse Rate:  [61-78] 61  (08/26 0500) Resp:  [15-18] 15  (08/26 0500) BP: (121-127)/(80-87) 121/82 mmHg (08/26 0500) SpO2:  [95 %-97 %] 97 % (08/26 0500) Weight:  [88.8 kg (195 lb 12.3 oz)] 88.8 kg (195 lb 12.3 oz) (08/26 0500) Weight change: 1.7 kg (3 lb 12 oz) Last BM Date: 11/11/11  PE: GEN:  Older appearing than stated age ABD:  Mild epigastric tenderness  Lab Results: CMP     Component Value Date/Time   NA 139 11/12/2011 0550   K 4.1 11/12/2011 0550   CL 103 11/12/2011 0550   CO2 25 11/12/2011 0550   GLUCOSE 118* 11/12/2011 0550   BUN 6 11/12/2011 0550   CREATININE 0.66 11/12/2011 0550   CALCIUM 9.3 11/12/2011 0550   PROT 6.2 11/13/2011 0510   ALBUMIN 3.2* 11/13/2011 0510   AST 91* 11/13/2011 0510   ALT 167* 11/13/2011 0510   ALKPHOS 147* 11/13/2011 0510   BILITOT 0.9 11/13/2011 0510   GFRNONAA >90 11/12/2011 0550   GFRAA >90 11/12/2011 0550   Assessment:  1.  Abdominal pain, chronic.  2.  Elevated liver tests, much improved.  Unclear if this is related to pain or not, which is chronic.  Plan:  1.  Endoscopic ultrasound tomorrow, with anesthesia, with possible ERCP (pending EUS results, and pending anesthesia availability for this second test). 2.  Consent obtained with [place] phone interpreter [phone]), in presence of Bonna Gains, RN.  Risks and benefits explained in detail. 3.  Will follow.  Thank you.   Freddy Jaksch 11/13/2011, 11:46 AM

## 2011-11-13 NOTE — Progress Notes (Signed)
[facility] PROGRESS NOTE  Dustin Hansen MVH:[id number] DOB: [birth year] DOA: 11/08/2011 PCP: Arelia Sneddon, MD  Assessment/Plan:  Acute hepatitis-alochol vs other toxin vs. Biliary obstruction  -Found out today pt took Abilify, Cogentin, Risperdal at home--hold for now in light of hepatic impairment  -states pain continues to improve correlated with improvement in his liver function studies -hx of alcohol abuse; Utox showed THC 11/08/11  -Interestingly, the patient reveals today that drinking milk,tea and eating bread helps his abdominal pain  -tolerating full liquids  - LFTs trending down  -previous viral hepatitis serologies showed immunity to Hep A and B, neg Hep C  Abnormal MRCP  -s/p chole  -await possible EUS and possible ERCP, tentatively planned for Tuesday  -Appreciate GI followup Opioid Dependence  -Review of outpatient records reveals innumerable number of visits into the emergency department for chronic back pain and abdominal pain requesting opioid medications.  Transaminasemia/fatty liver  - hyperbilirubinemia  Abdominal pain  -has been chronic according to old records  -unclear if some degree of gastritis, PUD  THC abuse    Procedures/Studies: US Abdomen Complete  11/08/2011  *RADIOLOGY REPORT*  Clinical Data:  Abnormal liver function studies.  COMPLETE ABDOMINAL ULTRASOUND  Comparison:  CT abdomen and pelvis 02/24/2010  Findings:  Gallbladder:  Surgically absent.  There appears to be a surgical clip in the gallbladder fossa.  Common bile duct:  Extrahepatic common bile ducts measure up to about 16 mm diameter.  This likely represents postoperative physiologic dilatation.  No common duct stones are visualized.  Liver:  Diffuse heterogeneous increased hepatic parenchymal echotexture suggesting fatty infiltration.  The no focal lesions are identified.  Limited color flow Doppler images of the main portal vein show flow in the appropriate directions.  IVC:  Appears  normal.  Pancreas:  Pancreas is mostly obscured by overlying bowel gas and is poorly visualized.  Spleen:  Spleen length measures 9.8 cm.  Normal parenchymal echotexture.  Right Kidney:  Right kidney measures 12.2 cm length.  No hydronephrosis.  Left Kidney:  Left kidney measures 12.1 cm length.  No hydronephrosis.  Abdominal aorta:  Limited visualization.  No aneurysm identified in the visualized areas.  IMPRESSION: Surgical absence of the gallbladder.  Moderate extrahepatic bile duct dilatation is likely due to postoperative physiology.  Diffuse fatty infiltration of the liver.   Original Report Authenticated By: Marlon Pel, M.D.    Mr 3d Recon At Scanner  11/09/2011  *RADIOLOGY REPORT*  Clinical Data:  Severe epigastric pain. Nausea and vomiting. Pancreatitis.  Elevated liver function test.  MRI ABDOMEN WITHOUT AND WITH CONTRAST (MRCP)  Technique:  Multiplanar multisequence MR imaging of the abdomen was performed without and with contrast, including heavily T2-weighted images of the biliary and pancreatic ducts.  Three-dimensional MR images were rendered by post processing of the original MR data.  Contrast: 19mL MULTIHANCE GADOBENATE DIMEGLUMINE 529 MG/ML IV SOLN  Comparison:   None  Findings:  Gallbladder surgically absent.  Mild dilatation of both intra and extrahepatic bile ducts is seen, with common bile duct measuring 10 mm in diameter. Abrupt tapering of the distal common bile duct is seen at the ampulla, but no definite meniscus sign is seen.  Differential diagnosis includes ampullary stenosis and occult impacted distal common bile duct stone.  There is no evidence of pancreatic ductal dilatation or pancreas divisum. No evidence of pancreatic soft tissue mass or peripancreatic inflammatory changes. Severe hepatic steatosis is demonstrated.  Dynamic postcontrast imaging is limited by motion artifact, but no liver  masses are identified.  Spleen is normal in size in appearance.  Several tiny right  renal cysts are seen, however there is no evidence of renal mass or hydronephrosis.  Both adrenal glands are normal appearance.  No lymphadenopathy identified.  No evidence of inflammatory process.  IMPRESSION:  1.  Mild biliary ductal dilatation with common bile duct measuring 10 mm.  Abrupt transition in tapering of the distal common bile duct is seen at the ampulla.  Differential diagnosis includes ampullary stenosis and an occult impacted distal common bile duct stone.  Consider ERCP for further evaluation. 2.  Severe hepatic steatosis. 3.   Normal appearance of pancreas.  No evidence of pancreatic mass, pancreas divisum, or pancreatic ductal dilatation.   Original Report Authenticated By: Danae Orleans, M.D.    Acute Abdominal Series  11/08/2011  *RADIOLOGY REPORT*  Clinical Data: Epigastric pain, nausea, vomiting, chills, cough. Smoker.  ACUTE ABDOMEN SERIES (ABDOMEN 2 VIEW & CHEST 1 VIEW)  Comparison: 02/24/2010  Findings: Slightly shallow inspiration.  Normal heart size and pulmonary vascularity.  No focal airspace consolidation in the lungs.  Slight fibrosis or atelectasis in the lung bases.  No blunting of costophrenic angles.  No pneumothorax.  Gas filled small bowel in the left upper quadrant are at the upper limits of normal caliber.  Localized ileus or early obstructive change could have this appearance.  Gas and stool scattered throughout the colon without distension.  No free intra-abdominal air.  No abnormal air fluid levels.  Surgical clips in the right upper quadrant.  Degenerative changes in the hips.  IMPRESSION: Nonspecific bowel gas pattern with borderline dilated small bowel loop in the left upper quadrant likely to represent ileus or enteritis.  Early obstruction is not entirely excluded.  Followup as clinically indicated.   Original Report Authenticated By: Marlon Pel, M.D.    Mr Abd W/wo Cm/mrcp  11/09/2011  *RADIOLOGY REPORT*  Clinical Data:  Severe epigastric pain. Nausea  and vomiting. Pancreatitis.  Elevated liver function test.  MRI ABDOMEN WITHOUT AND WITH CONTRAST (MRCP)  Technique:  Multiplanar multisequence MR imaging of the abdomen was performed without and with contrast, including heavily T2-weighted images of the biliary and pancreatic ducts.  Three-dimensional MR images were rendered by post processing of the original MR data.  Contrast: 19mL MULTIHANCE GADOBENATE DIMEGLUMINE 529 MG/ML IV SOLN  Comparison:   None  Findings:  Gallbladder surgically absent.  Mild dilatation of both intra and extrahepatic bile ducts is seen, with common bile duct measuring 10 mm in diameter. Abrupt tapering of the distal common bile duct is seen at the ampulla, but no definite meniscus sign is seen.  Differential diagnosis includes ampullary stenosis and occult impacted distal common bile duct stone.  There is no evidence of pancreatic ductal dilatation or pancreas divisum. No evidence of pancreatic soft tissue mass or peripancreatic inflammatory changes. Severe hepatic steatosis is demonstrated.  Dynamic postcontrast imaging is limited by motion artifact, but no liver masses are identified.  Spleen is normal in size in appearance.  Several tiny right renal cysts are seen, however there is no evidence of renal mass or hydronephrosis.  Both adrenal glands are normal appearance.  No lymphadenopathy identified.  No evidence of inflammatory process.  IMPRESSION:  1.  Mild biliary ductal dilatation with common bile duct measuring 10 mm.  Abrupt transition in tapering of the distal common bile duct is seen at the ampulla.  Differential diagnosis includes ampullary stenosis and an occult impacted distal common bile  duct stone.  Consider ERCP for further evaluation. 2.  Severe hepatic steatosis. 3.   Normal appearance of pancreas.  No evidence of pancreatic mass, pancreas divisum, or pancreatic ductal dilatation.   Original Report Authenticated By: Danae Orleans, M.D.     Code Status: Full Family  Communication: Pt at bedside Disposition Plan: Home when medically stable  Subjective: Patient states that pain continues to improve. He denies any fevers, chills, chest pain, shortness of breath, nausea, vomiting, dysuria, rashes.  Objective: Filed Vitals:   11/12/11 1400 11/12/11 2136 11/13/11 0500 11/13/11 1420  BP: 127/80 126/87 121/82 126/86  Pulse: 78 64 61 62  Temp: 97.4 F (36.3 C) 98.1 F (36.7 C) 98.5 F (36.9 C) 98.3 F (36.8 C)  TempSrc: Oral Oral Oral Oral  Resp: 18 16 15 18   Height:      Weight:   88.8 kg (195 lb 12.3 oz)   SpO2: 95% 96% 97% 96%    Intake/Output Summary (Last 24 hours) at 11/13/11 1926 Last data filed at 11/13/11 1814  Gross per 24 hour  Intake 5963.92 ml  Output   4550 ml  Net 1413.92 ml   Weight change: 1.7 kg (3 lb 12 oz) Exam:   General:  Pt is alert, follows commands appropriately, not in acute distress  HEENT: No icterus, No thrush,[place]/AT  Cardiovascular: Regular rate and rhythm, S1/S2, , no rubs, no gallops  Respiratory: Clear to auscultation bilaterally, no wheezing, no crackles, no rhonchi  Abdomen: Soft, non tender, epigastric tender without any rebound, bowel sounds present, no guarding  Extremities: No edema, no rashes, no lymphangitis  Data Reviewed: Basic Metabolic Panel:  Lab 11/12/11 1610 11/11/11 0510 11/10/11 0652 11/09/11 0527 11/07/11 2300  NA 139 139 139 140 141  K 4.1 4.1 3.7 4.5 3.4*  CL 103 103 104 106 101  CO2 25 25 26 26 27   GLUCOSE 118* 143* 180* 99 138*  BUN 6 6 4* <3* 8  CREATININE 0.66 0.67 0.74 0.73 0.85  CALCIUM 9.3 9.4 8.7 8.7 9.3  MG -- -- -- 2.0 --  PHOS -- -- -- 3.0 --   Liver Function Tests:  Lab 11/13/11 0510 11/12/11 0550 11/11/11 0510 11/10/11 0652 11/09/11 0527  AST 91* 107* 133* 211* 683*  ALT 167* 204* 250* 338* 574*  ALKPHOS 147* 160* 163* 175* 212*  BILITOT 0.9 1.1 1.5* 2.6* 6.9*  PROT 6.2 6.1 5.8* 5.4* 5.8*  ALBUMIN 3.2* 3.2* 3.0* 2.9* 3.1*    Lab 11/07/11 2300  LIPASE  61*  AMYLASE --   No results found for this basename: AMMONIA:5 in the last 168 hours CBC:  Lab 11/13/11 0510 11/09/11 0527 11/07/11 2300  WBC 7.5 5.6 9.1  NEUTROABS -- -- --  HGB 16.0 15.5 17.8*  HCT 46.9 44.5 49.1  MCV 95.1 93.3 91.3  PLT 162 168 193   Cardiac Enzymes:  Lab 11/08/11 2020 11/08/11 1452  CKTOTAL -- --  CKMB -- --  CKMBINDEX -- --  TROPONINI <0.30 <0.30   BNP: No components found with this basename: POCBNP:5 CBG:  Lab 11/10/11 1830  GLUCAP 101*    No results found for this or any previous visit (from the past 240 hour(s)).   Scheduled Meds:   . amLODipine  5 mg Oral Daily  . ciprofloxacin  400 mg Intravenous Once  . feeding supplement  1 Container Oral TID BM  . folic acid  1 mg Oral Daily  . lisinopril  10 mg Oral Daily  .  multivitamin with minerals  1 tablet Oral Daily  . pregabalin  50 mg Oral TID  . thiamine  100 mg Oral Daily  . DISCONTD: ciprofloxacin  400 mg Intravenous Once   Continuous Infusions:   . sodium chloride 175 mL/hr at 11/13/11 1449  . sodium chloride 20 mL/hr at 11/13/11 1624     Lequita Meadowcroft, DO  [facility] Pager [phone]  If 7PM-7AM, please contact night-coverage [email or web address] Password [email or web address] 11/13/2011, 7:26 PM   LOS: 5 days

## 2011-11-14 ENCOUNTER — Encounter (HOSPITAL_COMMUNITY): Payer: Self-pay | Admitting: *Deleted

## 2011-11-14 ENCOUNTER — Encounter (HOSPITAL_COMMUNITY)
Admission: EM | Disposition: A | Discharge status: Home / Self Care | Payer: Self-pay | Source: Home / Self Care | Attending: Internal Medicine

## 2011-11-14 ENCOUNTER — Encounter (HOSPITAL_COMMUNITY): Payer: Self-pay | Admitting: Anesthesiology

## 2011-11-14 ENCOUNTER — Inpatient Hospital Stay (HOSPITAL_COMMUNITY): Payer: Medicaid Other | Admitting: Anesthesiology

## 2011-11-14 DIAGNOSIS — K861 Other chronic pancreatitis: Secondary | ICD-10-CM

## 2011-11-14 HISTORY — PX: EUS: SHX5427

## 2011-11-14 SURGERY — ULTRASOUND, UPPER GI TRACT, ENDOSCOPIC
Anesthesia: General

## 2011-11-14 MED ORDER — CIPROFLOXACIN IN D5W 400 MG/200ML IV SOLN
INTRAVENOUS | Status: AC
  Filled 2011-11-14: qty 200

## 2011-11-14 MED ORDER — PROPOFOL INFUSION 10 MG/ML OPTIME
INTRAVENOUS | Status: DC | PRN
  Administered 2011-11-14: 75 ug/kg/min via INTRAVENOUS

## 2011-11-14 MED ORDER — PROMETHAZINE HCL 25 MG/ML IJ SOLN: 6.2500 mg | INTRAMUSCULAR | Status: DC | PRN

## 2011-11-14 MED ORDER — PROPOFOL 10 MG/ML IV BOLUS
INTRAVENOUS | Status: DC | PRN
  Administered 2011-11-14: 40 mg via INTRAVENOUS

## 2011-11-14 MED ORDER — AMLODIPINE BESYLATE 10 MG PO TABS: 5.0000 mg | ORAL_TABLET | Freq: Every day | ORAL | Status: DC

## 2011-11-14 MED ORDER — FENTANYL CITRATE 0.05 MG/ML IJ SOLN
INTRAMUSCULAR | Status: DC | PRN
  Administered 2011-11-14: 100 ug via INTRAVENOUS

## 2011-11-14 MED ORDER — MIDAZOLAM HCL 5 MG/5ML IJ SOLN
INTRAMUSCULAR | Status: DC | PRN
  Administered 2011-11-14: 2 mg via INTRAVENOUS

## 2011-11-14 MED ORDER — PREGABALIN 50 MG PO CAPS: 50.0000 mg | ORAL_CAPSULE | Freq: Three times a day (TID) | ORAL | Status: AC

## 2011-11-14 MED ORDER — LISINOPRIL 10 MG PO TABS: 10.0000 mg | ORAL_TABLET | Freq: Every day | ORAL | Status: AC

## 2011-11-14 NOTE — Preoperative (Signed)
Beta Blockers   Reason not to administer Beta Blockers:Not Applicable 

## 2011-11-14 NOTE — Op Note (Signed)
[facility] [address] [place], [address]   ENDOSCOPIC ULTRASOUND PROCEDURE REPORT  PATIENT: Dustin Hansen, Dustin Hansen  MR#: [id number] BIRTHDATE: [birth year]  GENDER: Male ENDOSCOPIST: Willis Modena, MD REFERRED BY:  Catarina Hartshorn, MD [facility]) PROCEDURE DATE:  11/14/2011 PROCEDURE:   Endoscopic Ultrasound ASA CLASS:      ASA-III INDICATIONS:   Abdominal pain; dilated bile duct; ? CBD stone; elevated LFTs MEDICATIONS: Cetacaine x 2; MAC anesthesia  DESCRIPTION OF PROCEDURE:   After the risks benefits and alternatives of the procedure were  explained, informed consent was obtained. The patient was then placed in the left, lateral, decubitus postion and IV sedation was administered. Throughout the procedure, the patients blood pressure, pulse and oxygen saturations were monitored continuously.  Under direct visualization, the Pentax Radial EUS [id number]  endoscope was introduced through the mouth  and advanced to the second portion of the duodenum      .  Water was used as necessary to provide an acoustic interface.  Upon completion of the imaging, water was removed and the patient was sent to the recovery room in satisfactory condition.     FINDINGS:      Radial EUS scope used.  Extensive changes of chronic pancreatitis throughout the pancreas, including hyperechoic strands, hyperechoic foci, and significant lobularity.  Ampulla normal via EUS.  Bile duct prominent, maximally   10mm, without any bile duct wall thickening or choledocholithiasis.  No pancreatic mass seen. Very hyperechoic liver consistent with steatosis.  IMPRESSION:     1.  Chronic pancreatitis, could be cause of some of his pain. 2.  Dilated but otherwise normal bile duct; no evidence of choledocholithiasis seen. 3.  Hepatic steatosis.  RECOMMENDATIONS:     1.  Watch for potential complications of procedure. 2.  Advance diet and manage supportively. 3.  OK to discharge home from outpatient perspective.   If pain becomes progressively severe despite escalating pharmacologic therapy, could consider elective EUS-guided celiac plexus block in the future. 4.  Follow-up with [facility] as-needed.   _______________________________ Rosalie DoctorWillis Modena, MD 11/14/2011 12:49 PM   CC:

## 2011-11-14 NOTE — Discharge Summary (Signed)
Physician Discharge Summary  Dustin Hansen ZOX:[id number] DOB: Jan 18, 1971 DOA: 11/08/2011  PCP: Arelia Sneddon, MD  Admit date: 11/08/2011 Discharge date: 11/14/2011  Recommendations for Outpatient Follow-up:  1. Pt will need to follow up with PCP in 2-3 weeks post discharge 2. Please obtain BMP to evaluate electrolytes and kidney function 3. Please also check CBC to evaluate Hg and Hct levels  Discharge Diagnoses:  Acute hepatitis-alochol vs other toxin vs. Biliary obstruction  -Found out today pt took Abilify, Cogentin, Risperdal at home--hold for now in light of hepatic impairment  -states pain continues to improve correlated with improvement in his liver function studies  -hx of alcohol abuse; Utox showed THC 11/08/11  -Interestingly, the patient reveals today that drinking milk,tea and eating bread helps his abdominal pain  -tolerating full liquids  - LFTs trending down  -previous viral hepatitis serologies showed immunity to Hep A and B, neg Hep C  Abnormal MRCP  -s/p chole  -Endoscopic ultrasound revealed chronic pancreatitis which may be the cause of some of the patient's pain -There was no pancreatic masses or choledocholithiasis -If pain becomes recurrent and intolerable, recommendation was for elective endoscopic ultrasound-guided celiac plexus block -Dr. Willis Modena is to arrange outpatient followup. Opioid Dependence  -Review of outpatient records reveals innumerable number of visits into the emergency department for chronic back pain and abdominal pain requesting opioid medications.  Transaminasemia/fatty liver  - hyperbilirubinemia, improved Abdominal pain  -has been chronic according to old records  -unclear if some degree of gastritis, PUD  THC abuse   Discharge Condition: Stable  Disposition:  Follow-up Information    Follow up with Arelia Sneddon, MD in 2 weeks.   Contact information:   [address] [place] [address] [phone]       Follow up with Freddy Jaksch, MD in 2 weeks.   Contact information:   [address] [place] [address] [phone]          Diet: Cardiac prudent Wt Readings from Last 3 Encounters:  11/13/11 88.8 kg (195 lb 12.3 oz)  11/13/11 88.8 kg (195 lb 12.3 oz)  02/05/11 90.719 kg (200 lb)    History of present illness:  40 year old [place] male presented with intolerable epigastric pain with intractable nausea and vomiting with significant elevation of his hepatic enzymes.  Hospital Course:  The patient was admitted to the hospital and started on intravenous fluids and intravenous analgesic medications. Initial liver enzymes showed AST 880, ALT 348, phosphatase 211, total bilirubin 1.7. His bilirubin continued to rise up to 6.9. Initially, the patient had an abdominal ultrasound which showed mild extrahepatic biliary ductal dilatation. This was followed up with an MRCP.  MRCP was ordered on August 21 which showed mild biliary ductal dilatation with concerns of stenosis and impacted common bile duct. Gastroenterology was consulted. The patient was switched to a clear liquid diet and tolerated it well. In fact, the patient wanted to eat solid food. The patient eventually underwent endoscopic ultrasound by the GI service which revealed chronic pancreatitis without any masses or strictures. Gastroenterology recommended symptomatic treatment. If the pain becomes intolerable or recurrent they recommended an endoscopic ultrasound-guided celiac plexus block in the future. The patient's pain gradually improved throughout the hospitalization. He remained hemodynamically stable. His liver enzymes including his bilirubin continued to improve. HIV test was negative. His psychotropic medicines were held to 2 concerns for hepatotoxicity. These will be restarted as an outpatient. The patient was instructed to stop using alcohol and cannabis.  Consultants: Gastroenterology, Dr.  Chrissie Noa outlaw  Discharge Exam: Filed Vitals:   11/14/11 1312  BP: 119/84  Pulse:   Temp:   Resp: 19   Filed Vitals:   11/14/11 1258 11/14/11 1300 11/14/11 1310 11/14/11 1312  BP: 99/54 107/43 107/43 119/84  Pulse:      Temp:      TempSrc:      Resp: 14 16 20 19   Height:      Weight:      SpO2: 98% 96% 95% 95%   General: A&O x 3, NAD, pleasant, cooperative Cardiovascular: RRR, no rub, no gallop, no S3 Respiratory: CTAB, no wheeze, no rhonchi Abdomen:soft, nontender nodes to palpation epigastric without any peritoneal signs, nondistended, positive bowel sounds  Discharge Instructions  Discharge Orders    Future Orders Please Complete By Expires   Diet - low sodium heart healthy      Increase activity slowly      Discharge instructions      Comments:   Stop smoking     Medication List  As of 11/14/2011  1:45 PM   TAKE these medications         amLODipine 10 MG tablet   Commonly known as: NORVASC   Take 0.5 tablets (5 mg total) by mouth daily.      ARIPiprazole 15 MG tablet   Commonly known as: ABILIFY   Take 15 mg by mouth daily.      benztropine 1 MG tablet   Commonly known as: COGENTIN   Take 1 mg by mouth 2 (two) times daily.      lisinopril 10 MG tablet   Commonly known as: PRINIVIL,ZESTRIL   Take 1 tablet (10 mg total) by mouth daily.      pregabalin 50 MG capsule   Commonly known as: LYRICA   Take 1 capsule (50 mg total) by mouth 3 (three) times daily.      risperiDONE 2 MG tablet   Commonly known as: RISPERDAL   Take 2 mg by mouth 3 (three) times daily.             The results of significant diagnostics from this hospitalization (including imaging, microbiology, ancillary and laboratory) are listed below for reference.    Significant Diagnostic Studies: US Abdomen Complete  11/08/2011  *RADIOLOGY REPORT*  Clinical Data:  Abnormal liver function studies.  COMPLETE ABDOMINAL ULTRASOUND  Comparison:  CT abdomen and pelvis 02/24/2010   Findings:  Gallbladder:  Surgically absent.  There appears to be a surgical clip in the gallbladder fossa.  Common bile duct:  Extrahepatic common bile ducts measure up to about 16 mm diameter.  This likely represents postoperative physiologic dilatation.  No common duct stones are visualized.  Liver:  Diffuse heterogeneous increased hepatic parenchymal echotexture suggesting fatty infiltration.  The no focal lesions are identified.  Limited color flow Doppler images of the main portal vein show flow in the appropriate directions.  IVC:  Appears normal.  Pancreas:  Pancreas is mostly obscured by overlying bowel gas and is poorly visualized.  Spleen:  Spleen length measures 9.8 cm.  Normal parenchymal echotexture.  Right Kidney:  Right kidney measures 12.2 cm length.  No hydronephrosis.  Left Kidney:  Left kidney measures 12.1 cm length.  No hydronephrosis.  Abdominal aorta:  Limited visualization.  No aneurysm identified in the visualized areas.  IMPRESSION: Surgical absence of the gallbladder.  Moderate extrahepatic bile duct dilatation is likely due to postoperative physiology.  Diffuse fatty infiltration of the liver.  Original Report Authenticated By: Marlon Pel, M.D.    Mr 3d Recon At Scanner  11/09/2011  *RADIOLOGY REPORT*  Clinical Data:  Severe epigastric pain. Nausea and vomiting. Pancreatitis.  Elevated liver function test.  MRI ABDOMEN WITHOUT AND WITH CONTRAST (MRCP)  Technique:  Multiplanar multisequence MR imaging of the abdomen was performed without and with contrast, including heavily T2-weighted images of the biliary and pancreatic ducts.  Three-dimensional MR images were rendered by post processing of the original MR data.  Contrast: 19mL MULTIHANCE GADOBENATE DIMEGLUMINE 529 MG/ML IV SOLN  Comparison:   None  Findings:  Gallbladder surgically absent.  Mild dilatation of both intra and extrahepatic bile ducts is seen, with common bile duct measuring 10 mm in diameter. Abrupt tapering of  the distal common bile duct is seen at the ampulla, but no definite meniscus sign is seen.  Differential diagnosis includes ampullary stenosis and occult impacted distal common bile duct stone.  There is no evidence of pancreatic ductal dilatation or pancreas divisum. No evidence of pancreatic soft tissue mass or peripancreatic inflammatory changes. Severe hepatic steatosis is demonstrated.  Dynamic postcontrast imaging is limited by motion artifact, but no liver masses are identified.  Spleen is normal in size in appearance.  Several tiny right renal cysts are seen, however there is no evidence of renal mass or hydronephrosis.  Both adrenal glands are normal appearance.  No lymphadenopathy identified.  No evidence of inflammatory process.  IMPRESSION:  1.  Mild biliary ductal dilatation with common bile duct measuring 10 mm.  Abrupt transition in tapering of the distal common bile duct is seen at the ampulla.  Differential diagnosis includes ampullary stenosis and an occult impacted distal common bile duct stone.  Consider ERCP for further evaluation. 2.  Severe hepatic steatosis. 3.   Normal appearance of pancreas.  No evidence of pancreatic mass, pancreas divisum, or pancreatic ductal dilatation.   Original Report Authenticated By: Danae Orleans, M.D.    Acute Abdominal Series  11/08/2011  *RADIOLOGY REPORT*  Clinical Data: Epigastric pain, nausea, vomiting, chills, cough. Smoker.  ACUTE ABDOMEN SERIES (ABDOMEN 2 VIEW & CHEST 1 VIEW)  Comparison: 02/24/2010  Findings: Slightly shallow inspiration.  Normal heart size and pulmonary vascularity.  No focal airspace consolidation in the lungs.  Slight fibrosis or atelectasis in the lung bases.  No blunting of costophrenic angles.  No pneumothorax.  Gas filled small bowel in the left upper quadrant are at the upper limits of normal caliber.  Localized ileus or early obstructive change could have this appearance.  Gas and stool scattered throughout the colon without  distension.  No free intra-abdominal air.  No abnormal air fluid levels.  Surgical clips in the right upper quadrant.  Degenerative changes in the hips.  IMPRESSION: Nonspecific bowel gas pattern with borderline dilated small bowel loop in the left upper quadrant likely to represent ileus or enteritis.  Early obstruction is not entirely excluded.  Followup as clinically indicated.   Original Report Authenticated By: Marlon Pel, M.D.    Mr Abd W/wo Cm/mrcp  11/09/2011  *RADIOLOGY REPORT*  Clinical Data:  Severe epigastric pain. Nausea and vomiting. Pancreatitis.  Elevated liver function test.  MRI ABDOMEN WITHOUT AND WITH CONTRAST (MRCP)  Technique:  Multiplanar multisequence MR imaging of the abdomen was performed without and with contrast, including heavily T2-weighted images of the biliary and pancreatic ducts.  Three-dimensional MR images were rendered by post processing of the original MR data.  Contrast: 19mL MULTIHANCE GADOBENATE DIMEGLUMINE  529 MG/ML IV SOLN  Comparison:   None  Findings:  Gallbladder surgically absent.  Mild dilatation of both intra and extrahepatic bile ducts is seen, with common bile duct measuring 10 mm in diameter. Abrupt tapering of the distal common bile duct is seen at the ampulla, but no definite meniscus sign is seen.  Differential diagnosis includes ampullary stenosis and occult impacted distal common bile duct stone.  There is no evidence of pancreatic ductal dilatation or pancreas divisum. No evidence of pancreatic soft tissue mass or peripancreatic inflammatory changes. Severe hepatic steatosis is demonstrated.  Dynamic postcontrast imaging is limited by motion artifact, but no liver masses are identified.  Spleen is normal in size in appearance.  Several tiny right renal cysts are seen, however there is no evidence of renal mass or hydronephrosis.  Both adrenal glands are normal appearance.  No lymphadenopathy identified.  No evidence of inflammatory process.   IMPRESSION:  1.  Mild biliary ductal dilatation with common bile duct measuring 10 mm.  Abrupt transition in tapering of the distal common bile duct is seen at the ampulla.  Differential diagnosis includes ampullary stenosis and an occult impacted distal common bile duct stone.  Consider ERCP for further evaluation. 2.  Severe hepatic steatosis. 3.   Normal appearance of pancreas.  No evidence of pancreatic mass, pancreas divisum, or pancreatic ductal dilatation.   Original Report Authenticated By: Danae Orleans, M.D.      Microbiology: No results found for this or any previous visit (from the past 240 hour(s)).   Labs: Basic Metabolic Panel:  Lab 11/12/11 9147 11/11/11 0510 11/10/11 0652 11/09/11 0527 11/07/11 2300  NA 139 139 139 140 141  K 4.1 4.1 -- -- --  CL 103 103 104 106 101  CO2 25 25 26 26 27   GLUCOSE 118* 143* 180* 99 138*  BUN 6 6 4* <3* 8  CREATININE 0.66 0.67 0.74 0.73 0.85  CALCIUM 9.3 9.4 8.7 8.7 9.3  MG -- -- -- 2.0 --  PHOS -- -- -- 3.0 --   Liver Function Tests:  Lab 11/13/11 0510 11/12/11 0550 11/11/11 0510 11/10/11 0652 11/09/11 0527  AST 91* 107* 133* 211* 683*  ALT 167* 204* 250* 338* 574*  ALKPHOS 147* 160* 163* 175* 212*  BILITOT 0.9 1.1 1.5* 2.6* 6.9*  PROT 6.2 6.1 5.8* 5.4* 5.8*  ALBUMIN 3.2* 3.2* 3.0* 2.9* 3.1*    Lab 11/07/11 2300  LIPASE 61*  AMYLASE --   No results found for this basename: AMMONIA:5 in the last 168 hours CBC:  Lab 11/13/11 0510 11/09/11 0527 11/07/11 2300  WBC 7.5 5.6 9.1  NEUTROABS -- -- --  HGB 16.0 15.5 17.8*  HCT 46.9 44.5 49.1  MCV 95.1 93.3 91.3  PLT 162 168 193   Cardiac Enzymes:  Lab 11/08/11 2020 11/08/11 1452  CKTOTAL -- --  CKMB -- --  CKMBINDEX -- --  TROPONINI <0.30 <0.30   BNP: No components found with this basename: POCBNP:5 CBG:  Lab 11/10/11 1830  GLUCAP 101*    Time coordinating discharge:  Greater than 30 minutes  Signed:  Chae Oommen, DO [facility] Pager:  [phone] 11/14/2011, 1:45 PM

## 2011-11-14 NOTE — Anesthesia Preprocedure Evaluation (Addendum)
Anesthesia Evaluation  Patient identified by MRN, date of birth, ID band Patient awake    Reviewed: Allergy & Precautions, H&P , NPO status , Patient's Chart, lab work & pertinent test results  Airway       Dental  (+) Dental Advisory Given   Pulmonary          Cardiovascular hypertension, Pt. on medications - CHF     Neuro/Psych  Headaches, Seizures -,  BPADNo residual symptoms from CVA, unsure if actually had CVA    GI/Hepatic GERD-  ,(+) Hepatitis -, Unspecified  Endo/Other  negative endocrine ROS  Renal/GU negative Renal ROS     Musculoskeletal negative musculoskeletal ROS (+)   Abdominal   Peds  Hematology negative hematology ROS (+)   Anesthesia Other Findings   Reproductive/Obstetrics                      Anesthesia Physical Anesthesia Plan  ASA: III  Anesthesia Plan: MAC   Post-op Pain Management:    Induction: Intravenous  Airway Management Planned: Simple Face Mask  Additional Equipment:   Intra-op Plan:   Post-operative Plan: Extubation in OR  Informed Consent: I have reviewed the patients History and Physical, chart, labs and discussed the procedure including the risks, benefits and alternatives for the proposed anesthesia with the patient or authorized representative who has indicated his/her understanding and acceptance.   Dental advisory given  Plan Discussed with: CRNA and Surgeon  Anesthesia Plan Comments:         Anesthesia Quick Evaluation

## 2011-11-14 NOTE — Transfer of Care (Signed)
Immediate Anesthesia Transfer of Care Note  Patient: Dustin Hansen  Procedure(s) Performed: Procedure(s) (LRB): FULL UPPER ENDOSCOPIC ULTRASOUND (EUS) RADIAL (N/A)  Patient Location: Endoscopy Unit  Anesthesia Type: MAC  Level of Consciousness: sedated  Airway & Oxygen Therapy: Patient Spontanous Breathing and Patient connected to nasal cannula oxygen  Post-op Assessment: Report given to PACU RN and Post -op Vital signs reviewed and stable  Post vital signs: Reviewed and stable  Complications: No apparent anesthesia complications

## 2011-11-14 NOTE — Interval H&P Note (Signed)
History and Physical Interval Note:  11/14/2011 10:29 AM  Dustin Hansen  has presented today for surgery, with the diagnosis of elevated LFTs poss.common bile duct stones  The various methods of treatment have been discussed with the patient and family. After consideration of risks, benefits and other options for treatment, the patient has consented to  Procedure(s) (LRB): FULL UPPER ENDOSCOPIC ULTRASOUND (EUS) RADIAL (N/A) ENDOSCOPIC RETROGRADE CHOLANGIOPANCREATOGRAPHY (ERCP) WITH PROPOFOL (N/A) as a surgical intervention .  The patient's history has been reviewed, patient examined, no change in status, stable for surgery.  I have reviewed the patient's chart and labs.  Questions were answered to the patient's satisfaction.     Sincerity Cedar M  Assessment: 1.  Abdominal pain, chronic. 2.  Abnormal MRCP. 3.  Elevated LFTs, much improved.  Plan: 1.  Endoscopic ultrasound. 2.  If EUS shows choledocholithiasis, will arrange ERCP (today at time of EUS or electively, depending on anesthesia scheduling abilities today). 3.  Consent obtained yesterday with phone interpreter (see yesterday's note for details).  Risks and benefits again reviewed today at bedside with [place] interpreter Ethelene Browns, from [place]).  Risks (bleeding, infection, bowel perforation that could require surgery, sedation-related changes in cardiopulmonary systems), benefits (identification and possible treatment of source of symptoms, exclusion of certain causes of symptoms), and alternatives (watchful waiting, radiographic imaging studies, empiric medical treatment) of upper endoscopy with ultrasound (EUS) were explained to patient in detail and patient wishes to proceed.  Risks (up to and including bleeding, infection, perforation, pancreatitis that can be complicated by infected necrosis and death), benefits (removal of stones, alleviating blockage, decreasing risk of cholangitis or choledocholithiasis-related pancreatitis), and  alternatives (watchful waiting, percutaneous transhepatic cholangiography) of ERCP were explained to patient in detail and patient elects to proceed.

## 2011-11-14 NOTE — Anesthesia Postprocedure Evaluation (Signed)
Anesthesia Post Note  Patient: Dustin Hansen  Procedure(s) Performed: Procedure(s) (LRB): FULL UPPER ENDOSCOPIC ULTRASOUND (EUS) RADIAL (N/A)  Anesthesia type: MAC  Patient location: PACU  Post pain: Pain level controlled  Post assessment: Post-op Vital signs reviewed  Last Vitals: BP 110/76  Pulse 90  Temp 36.8 C (Oral)  Resp 20  Ht 5\' 6"  (1.676 m)  Wt 195 lb 12.3 oz (88.8 kg)  BMI 31.60 kg/m2  SpO2 96%  Post vital signs: Reviewed  Level of consciousness: awake  Complications: No apparent anesthesia complications

## 2011-11-14 NOTE — Progress Notes (Signed)
Discharge instructions completed using telephonic interpreter service.  Instructions included follow up care, medications and diet.  Patient verbalizes understanding with no further questions.  Patient to be discharged to home. VSS, no complaints of pain.

## 2011-11-15 ENCOUNTER — Encounter (HOSPITAL_COMMUNITY): Payer: Self-pay | Admitting: Gastroenterology

## 2012-05-18 ENCOUNTER — Emergency Department (HOSPITAL_COMMUNITY)
Admission: EM | Admit: 2012-05-18 | Discharge: 2012-05-19 | Disposition: A | Discharge status: Home / Self Care | Payer: Medicaid Other | Attending: Emergency Medicine | Admitting: Emergency Medicine

## 2012-05-18 ENCOUNTER — Encounter (HOSPITAL_COMMUNITY): Payer: Self-pay | Admitting: *Deleted

## 2012-05-18 DIAGNOSIS — F411 Generalized anxiety disorder: Secondary | ICD-10-CM | POA: Insufficient documentation

## 2012-05-18 DIAGNOSIS — X001XXA Exposure to smoke in uncontrolled fire in building or structure, initial encounter: Secondary | ICD-10-CM | POA: Insufficient documentation

## 2012-05-18 DIAGNOSIS — I252 Old myocardial infarction: Secondary | ICD-10-CM | POA: Insufficient documentation

## 2012-05-18 DIAGNOSIS — Z8701 Personal history of pneumonia (recurrent): Secondary | ICD-10-CM | POA: Insufficient documentation

## 2012-05-18 DIAGNOSIS — F259 Schizoaffective disorder, unspecified: Secondary | ICD-10-CM | POA: Insufficient documentation

## 2012-05-18 DIAGNOSIS — Z8719 Personal history of other diseases of the digestive system: Secondary | ICD-10-CM | POA: Insufficient documentation

## 2012-05-18 DIAGNOSIS — Z8711 Personal history of peptic ulcer disease: Secondary | ICD-10-CM | POA: Insufficient documentation

## 2012-05-18 DIAGNOSIS — F319 Bipolar disorder, unspecified: Secondary | ICD-10-CM | POA: Insufficient documentation

## 2012-05-18 DIAGNOSIS — Y92009 Unspecified place in unspecified non-institutional (private) residence as the place of occurrence of the external cause: Secondary | ICD-10-CM | POA: Insufficient documentation

## 2012-05-18 DIAGNOSIS — Y9389 Activity, other specified: Secondary | ICD-10-CM | POA: Insufficient documentation

## 2012-05-18 DIAGNOSIS — G8929 Other chronic pain: Secondary | ICD-10-CM | POA: Insufficient documentation

## 2012-05-18 DIAGNOSIS — X021XXA Exposure to smoke in controlled fire in building or structure, initial encounter: Secondary | ICD-10-CM

## 2012-05-18 DIAGNOSIS — Z79899 Other long term (current) drug therapy: Secondary | ICD-10-CM | POA: Insufficient documentation

## 2012-05-18 DIAGNOSIS — Z8673 Personal history of transient ischemic attack (TIA), and cerebral infarction without residual deficits: Secondary | ICD-10-CM | POA: Insufficient documentation

## 2012-05-18 DIAGNOSIS — Z8679 Personal history of other diseases of the circulatory system: Secondary | ICD-10-CM | POA: Insufficient documentation

## 2012-05-18 DIAGNOSIS — Z8669 Personal history of other diseases of the nervous system and sense organs: Secondary | ICD-10-CM | POA: Insufficient documentation

## 2012-05-18 DIAGNOSIS — Z8739 Personal history of other diseases of the musculoskeletal system and connective tissue: Secondary | ICD-10-CM | POA: Insufficient documentation

## 2012-05-18 DIAGNOSIS — I10 Essential (primary) hypertension: Secondary | ICD-10-CM | POA: Insufficient documentation

## 2012-05-18 DIAGNOSIS — Z8709 Personal history of other diseases of the respiratory system: Secondary | ICD-10-CM | POA: Insufficient documentation

## 2012-05-18 DIAGNOSIS — F17200 Nicotine dependence, unspecified, uncomplicated: Secondary | ICD-10-CM | POA: Insufficient documentation
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
## 2012-05-19 ENCOUNTER — Encounter (HOSPITAL_COMMUNITY): Payer: Self-pay | Admitting: Emergency Medicine

## 2012-05-19 LAB — CARBOXYHEMOGLOBIN
Carboxyhemoglobin: 6.1 % (ref 0.5–1.5)
Methemoglobin: 1 % (ref 0.0–1.5)
O2 Saturation: 95.3 %
Total hemoglobin: 17.3 g/dL (ref 13.5–18.0)

## 2012-05-19 LAB — POCT I-STAT, CHEM 8
BUN: 9 mg/dL (ref 6–23)
Calcium, Ion: 1.14 mmol/L (ref 1.12–1.23)
Chloride: 106 mEq/L (ref 96–112)
Creatinine, Ser: 1 mg/dL (ref 0.50–1.35)
Glucose, Bld: 86 mg/dL (ref 70–99)
HCT: 53 % — ABNORMAL HIGH (ref 39.0–52.0)
Hemoglobin: 18 g/dL — ABNORMAL HIGH (ref 13.0–17.0)
Potassium: 3.2 mEq/L — ABNORMAL LOW (ref 3.5–5.1)
Sodium: 144 meq/L (ref 135–145)
TCO2: 23 mmol/L (ref 0–100)
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
## 2013-01-01 IMAGING — CR DG FINGER RING 2+V*L*
3 series · 3 of 3 positions shown · non-contrast
Comparison: None.

CLINICAL DATA: Fall

LEFT RING FINGER 2+V

[x finger pa left]
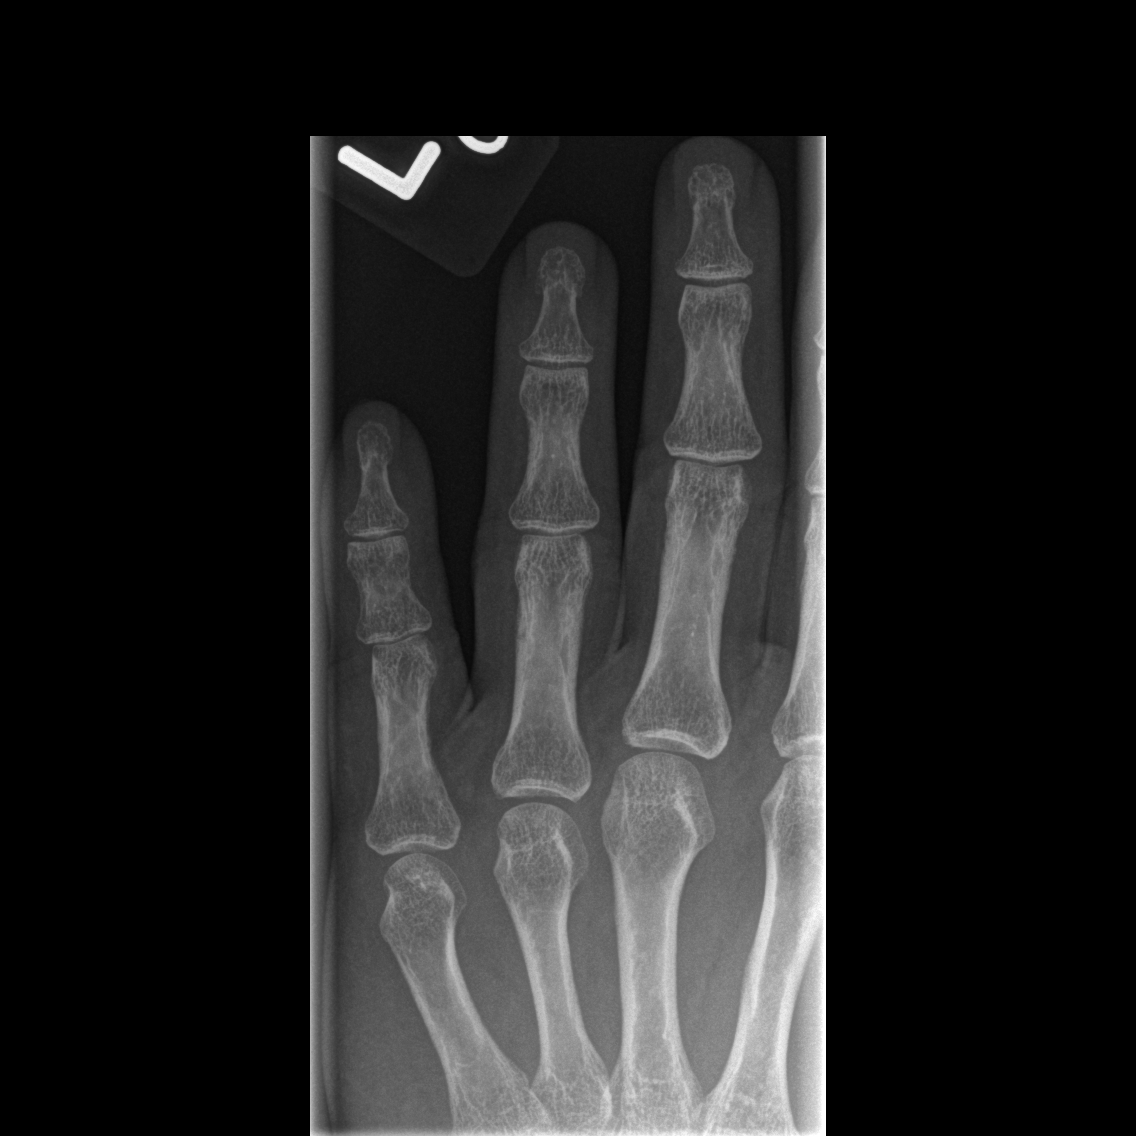

[x finger obl. left]
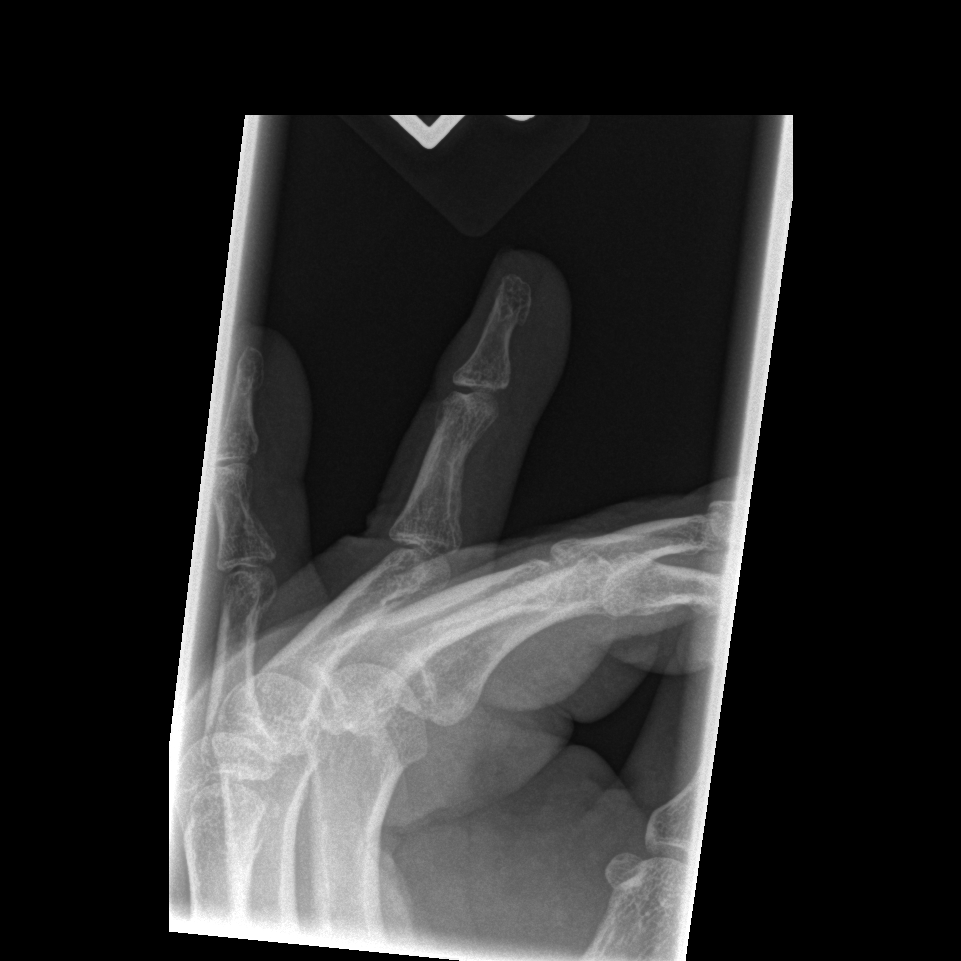

[x finger lateral left]
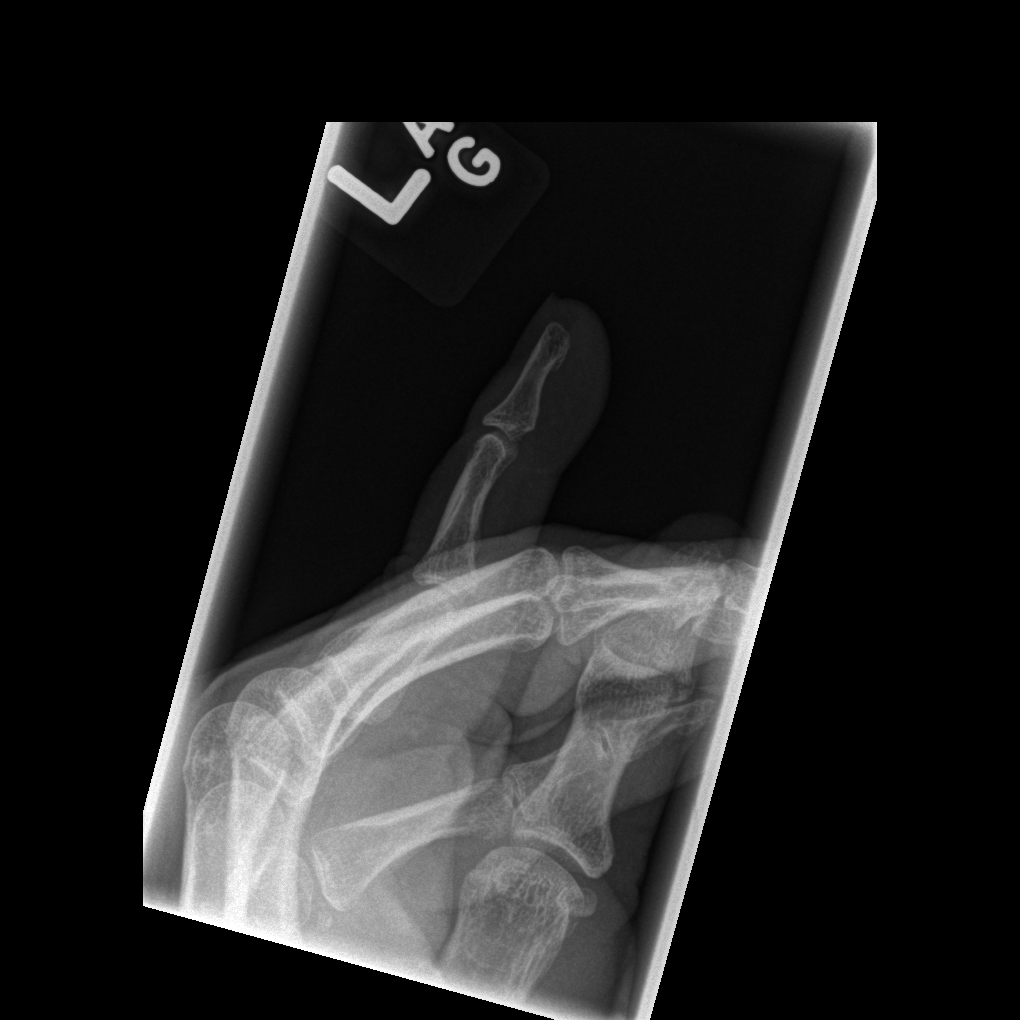

[3 of 3 positions shown; findings below may reference images not displayed]

FINDINGS: Negative for fracture.  Negative for arthropathy or
degenerative change.
IMPRESSION: Negative

## 2013-06-28 ENCOUNTER — Encounter (HOSPITAL_COMMUNITY): Payer: Self-pay | Admitting: Emergency Medicine

## 2013-06-28 ENCOUNTER — Emergency Department (HOSPITAL_COMMUNITY)
Admission: EM | Admit: 2013-06-28 | Discharge: 2013-06-28 | Disposition: A | Discharge status: Home / Self Care | Payer: Medicaid Other | Attending: Emergency Medicine | Admitting: Emergency Medicine

## 2013-06-28 ENCOUNTER — Emergency Department (HOSPITAL_COMMUNITY): Payer: Medicaid Other

## 2013-06-28 DIAGNOSIS — Z791 Long term (current) use of non-steroidal anti-inflammatories (NSAID): Secondary | ICD-10-CM | POA: Insufficient documentation

## 2013-06-28 DIAGNOSIS — F17200 Nicotine dependence, unspecified, uncomplicated: Secondary | ICD-10-CM | POA: Insufficient documentation

## 2013-06-28 DIAGNOSIS — Z8673 Personal history of transient ischemic attack (TIA), and cerebral infarction without residual deficits: Secondary | ICD-10-CM | POA: Insufficient documentation

## 2013-06-28 DIAGNOSIS — M79609 Pain in unspecified limb: Secondary | ICD-10-CM | POA: Insufficient documentation

## 2013-06-28 DIAGNOSIS — IMO0002 Reserved for concepts with insufficient information to code with codable children: Secondary | ICD-10-CM

## 2013-06-28 DIAGNOSIS — M79673 Pain in unspecified foot: Secondary | ICD-10-CM

## 2013-06-28 DIAGNOSIS — Z8659 Personal history of other mental and behavioral disorders: Secondary | ICD-10-CM | POA: Insufficient documentation

## 2013-06-28 DIAGNOSIS — I10 Essential (primary) hypertension: Secondary | ICD-10-CM | POA: Insufficient documentation

## 2013-06-28 DIAGNOSIS — M1710 Unilateral primary osteoarthritis, unspecified knee: Secondary | ICD-10-CM | POA: Insufficient documentation

## 2013-06-28 DIAGNOSIS — Z8701 Personal history of pneumonia (recurrent): Secondary | ICD-10-CM | POA: Insufficient documentation

## 2013-06-28 DIAGNOSIS — M19079 Primary osteoarthritis, unspecified ankle and foot: Secondary | ICD-10-CM | POA: Insufficient documentation

## 2013-06-28 DIAGNOSIS — I252 Old myocardial infarction: Secondary | ICD-10-CM | POA: Insufficient documentation

## 2013-06-28 DIAGNOSIS — G8929 Other chronic pain: Secondary | ICD-10-CM | POA: Insufficient documentation

## 2013-06-28 DIAGNOSIS — Z8719 Personal history of other diseases of the digestive system: Secondary | ICD-10-CM | POA: Insufficient documentation

## 2013-06-28 MED ORDER — IBUPROFEN 800 MG PO TABS: 800.0000 mg | ORAL_TABLET | Freq: Three times a day (TID) | ORAL | Status: DC

## 2013-06-28 MED ORDER — IBUPROFEN 200 MG PO TABS
400.0000 mg | ORAL_TABLET | Freq: Once | ORAL | Status: AC
  Administered 2013-06-28: 400 mg via ORAL
  Filled 2013-06-28: qty 2

## 2013-06-28 NOTE — ED Notes (Signed)
Pt called EMS for right foot pain. Pt started an hour ago. Pt states he cannot walk right now because it hurts too bad. Pt states he did not have any trauma to foot.  BP 124 systolic, HR 80, 409%WJ100%RA

## 2013-06-28 NOTE — ED Provider Notes (Signed)
CSN: [id number]     Arrival date & time 06/28/13  1610 History   First MD Initiated Contact with Patient 06/28/13 1613     Chief Complaint  Patient presents with  . Foot Pain     (Consider location/radiation/quality/duration/timing/severity/associated sxs/prior Treatment) The history is provided by the patient. No language interpreter was used.  Dustin Hansen is a 43 y/o M with PMHx of chronic back pain, hypertension, AAA, pneumonia, shortness of breath, mental disorder, depression, bipolar disorder, schizoaffective schizophrenia, GERD, hepatitis, stroke, seizures, arthritis presenting to the ED with right foot pain that started approximately 2:00 PM this afternoon. Patient reports it was a sudden onset described as a throbbing sensation localized to the right foot-reported that the pain radiates "everywhere." Stated that the pain worsens with applying pressure to the foot. Stated that he has not used anything for the discomfort. Reported that when the pain started he called 911. Patient reports that this is gout in his foot, reported history of gout, stated that the last time he was in his left foot. Denied numbness, fall, injury, fever, drainage, loss of sensation, chest pain, short of breath, difficulty breathing. PCP Dr. Debroah Baller  Past Medical History  Diagnosis Date  . Back pain, chronic   . Hypertension   . AAA (abdominal aortic aneurysm) 10/2010    4.1cm on CT scan  . Myocardial infarction     "think so; when I was young; used to go to heart dr"  . Pneumonia   . Chronic bronchitis 11/08/2011    "get it q month"  . Shortness of breath 11/08/2011    "lying down"  . Mental disorder 11/08/2011    "I get crazy; I take medicine"  . Depression   . Anxiety   . Bipolar 1 disorder   . Schizo-affective schizophrenia   . GERD (gastroesophageal reflux disease)   . History of stomach ulcers 11/08/2011  . Hepatitis 11/08/2011    "not sure which one"  . Daily headache 11/08/2011  . Stroke 11/08/2011      points to right chest  . 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 Burning with urination 11/08/2011  . Arthritis 11/08/2011    "feet and knees"   Past Surgical History  Procedure Laterality Date  . Cholecystectomy  2008  . Eus  11/14/2011    Procedure: FULL UPPER ENDOSCOPIC ULTRASOUND (EUS) RADIAL;  Surgeon: Willis Modena, MD;  Location: [facility];  Service: Endoscopy;  Laterality: N/A;  to be carelinked from [facility]   Family History  Problem Relation Age of Onset  . Hypertension Mother    History  Substance Use Topics  . Smoking status: Current Every Day Smoker -- 0.50 packs/day for 29 years    Types: Cigarettes  . Smokeless tobacco: Never Used  . Alcohol Use: 3.0 oz/week    5 Cans of beer per week     Comment: 11/08/2011 "4-5 bottles beer/wk"    Review of Systems  Constitutional: Negative for fever and chills.  Respiratory: Negative for chest tightness and shortness of breath.   Cardiovascular: Negative for chest pain.  Musculoskeletal: Positive for arthralgias (Right foot pain).  Neurological: Negative for numbness.  All other systems reviewed and are negative.     Allergies  Review of patient's allergies indicates no known allergies.  Home Medications   Current Outpatient Rx  Name  Route  Sig  Dispense  Refill  . ibuprofen (ADVIL,MOTRIN) 800 MG tablet   Oral   Take 1 tablet (800 mg  total) by mouth 3 (three) times daily.   21 tablet   0    BP 123/81  Pulse 74  Temp(Src) 97.7 F (36.5 C) (Oral)  Resp 18  SpO2 98% Physical Exam  Nursing note and vitals reviewed. Constitutional: He is oriented to person, place, and time. He appears well-developed and well-nourished. No distress.  HENT:  Head: Normocephalic and atraumatic.  Mouth/Throat: Oropharynx is clear and moist. No oropharyngeal exudate.  Eyes: Conjunctivae and EOM are normal. Pupils are equal, round, and reactive to light. Right eye exhibits no discharge. Left eye exhibits no discharge.  Neck:  Normal range of motion. Neck supple. No tracheal deviation present.  Cardiovascular: Normal rate, regular rhythm and normal heart sounds.  Exam reveals no friction rub.   No murmur heard. Pulses:      Radial pulses are 2+ on the right side, and 2+ on the left side.       Dorsalis pedis pulses are 2+ on the right side, and 2+ on the left side.  Pulmonary/Chest: Effort normal and breath sounds normal. No respiratory distress. He has no wheezes. He has no rales.  Musculoskeletal: Normal range of motion. He exhibits tenderness.       Feet:  Negative swelling, erythema, inflammation, lesions, sores, deformities and right foot. Full range of motion to the right ankle without difficulty noted. Full range of motion to the digits of the right foot without difficulty. Discomfort upon palpation to the dorsal aspect of the right foot without crepitus. Full ROM to upper and lower extremities without difficulty noted, negative ataxia noted.  Lymphadenopathy:    He has no cervical adenopathy.  Neurological: He is alert and oriented to person, place, and time. No cranial nerve deficit. He exhibits normal muscle tone. Coordination normal.  Cranial nerves III-XII grossly intact Strength 5+/5+ to upper and lower extremities bilaterally with resistance applied, equal distribution noted Strength intact to the digits of the right foot Sensation intact with differentiation to sharp and dull touch to the lower extremities  Skin: Skin is warm and dry. No rash noted. He is not diaphoretic. No erythema.  Psychiatric: He has a normal mood and affect. His behavior is normal. Thought content normal.    ED Course  Procedures (including critical care time) Labs Review Labs Reviewed - No data to display Imaging Review Dg Foot Complete Right  06/28/2013   CLINICAL DATA:  Right foot pain  EXAM: RIGHT FOOT COMPLETE - 3+ VIEW  COMPARISON:  None.  FINDINGS: There is no evidence of fracture or dislocation. There is no  evidence of arthropathy or other focal bone abnormality. Soft tissues are unremarkable.  IMPRESSION: Negative.   Electronically Signed   By: Signa Kell M.D.   On: 06/28/2013 18:04     EKG Interpretation None      MDM   Final diagnoses:  Foot pain  Chronic pain   Medications  ibuprofen (ADVIL,MOTRIN) tablet 400 mg (400 mg Oral Given 06/28/13 1730)   Filed Vitals:   06/28/13 1611 06/28/13 1700 06/28/13 1844  BP: 130/104 132/91 123/81  Pulse: 76 74 74  Temp: 97.7 F (36.5 C)    TempSrc: Oral    Resp: 16  18  SpO2: 98% 100% 98%    Patient presenting to the ED with right foot pain that started abruptly at 2:00 this afternoon described as a constant throbbing sensation. Patient reports that he has history of gout-reported he had an episode couple years ago to his left foot. States  that the pain worsens with applying pressure to the right foot. Alert and oriented. GCS 15. Heart rate and rhythm normal. Lungs good auscultation to upper and lower lobes bilaterally. Radial and DP pulses 2+ bilaterally. Cap refill less than 3 seconds. Full range of motion to the digits of the right foot. Negative swelling, erythema, inflammation, warmth upon palpation to the right foot. Discomfort upon palpation to the base of the right great toe. Negative signs of punctate graft. Negative active drainage noted. Sensation intact with differentiation sharp and dull touch. Minimally decreased strength to right great toe secondary to pain. Patient is able to walk in the hallway without difficulty-patient is able to apply pressure to the right foot-negative step off or limp noted. Plain film of right foot negative for acute abnormalities-negative soft tissue swelling. Doubt septic joint. Does not appear to be gout at this time. Negative deformities identified to the foot. Negative focal neurological deficits noted. Negative crepitus. Discussed case with attending physician who agreed to pain medications and  discharge - did not recommend steroids to be discharge home with. Definitive etiology of the pain unknown. Patient does have history of chronic pain. Pain medications administered in ED setting. Patient placed in a postop shoe. Patient stable, afebrile. Discharged patient. Discharged patient with medications. Discussed with patient to rest and stay hydrated. Discussed with patient to keep foot and shoe. Referred patient to orthopedics and primary care provider. Discussed with patient to avoid any physical or strenuous activity. Discussed with patient to elevate and ice. Discussed with patient to closely monitor symptoms and if symptoms are to worsen or change to report back to the ED - strict return instructions given.  Patient agreed to plan of care, understood, all questions answered.   Raymon MuttonMarissa Monasia Lair, PA-C 06/29/13 [phone]

## 2013-06-28 NOTE — Discharge Instructions (Signed)
Please call your doctor for a followup appointment within 24-48 hours. When you talk to your doctor please let them know that you were seen in the emergency department and have them acquire all of your records so that they can discuss the findings with you and formulate a treatment plan to fully care for your new and ongoing problems. Please call and set-up an appointment with your primary care provider to be re-assessed within the next 24-48 hours Please avoid any physical or strenuous activity Please take medications as prescribed Please keep foot in post-op shoe Please rest, ice, and elevated Please continue to monitor symptoms closely and if symptoms are to worsen or change (fever greater than 101, chills, sweating, nausea, vomiting, swelling to the foot, numbness, tingling, fall, injury, weakness, red streaks to the foot, drainage from the foot) please report back to the ED immediately  Arthralgia Your caregiver has diagnosed you as suffering from an arthralgia. Arthralgia means there is pain in a joint. This can come from many reasons including:  Bruising the joint which causes soreness (inflammation) in the joint.  Wear and tear on the joints which occur as we grow older (osteoarthritis).  Overusing the joint.  Various forms of arthritis.  Infections of the joint. Regardless of the cause of pain in your joint, most of these different pains respond to anti-inflammatory drugs and rest. The exception to this is when a joint is infected, and these cases are treated with antibiotics, if it is a bacterial infection. HOME CARE INSTRUCTIONS   Rest the injured area for as long as directed by your caregiver. Then slowly start using the joint as directed by your caregiver and as the pain allows. Crutches as directed may be useful if the ankles, knees or hips are involved. If the knee was splinted or casted, continue use and care as directed. If an stretchy or elastic wrapping bandage has been  applied today, it should be removed and re-applied every 3 to 4 hours. It should not be applied tightly, but firmly enough to keep swelling down. Watch toes and feet for swelling, bluish discoloration, coldness, numbness or excessive pain. If any of these problems (symptoms) occur, remove the ace bandage and re-apply more loosely. If these symptoms persist, contact your caregiver or return to this location.  For the first 24 hours, keep the injured extremity elevated on pillows while lying down.  Apply ice for 15-20 minutes to the sore joint every couple hours while awake for the first half day. Then 03-04 times per day for the first 48 hours. Put the ice in a plastic bag and place a towel between the bag of ice and your skin.  Wear any splinting, casting, elastic bandage applications, or slings as instructed.  Only take over-the-counter or prescription medicines for pain, discomfort, or fever as directed by your caregiver. Do not use aspirin immediately after the injury unless instructed by your physician. Aspirin can cause increased bleeding and bruising of the tissues.  If you were given crutches, continue to use them as instructed and do not resume weight bearing on the sore joint until instructed. Persistent pain and inability to use the sore joint as directed for more than 2 to 3 days are warning signs indicating that you should see a caregiver for a follow-up visit as soon as possible. Initially, a hairline fracture (break in bone) may not be evident on X-rays. Persistent pain and swelling indicate that further evaluation, non-weight bearing or use of the joint (use  of crutches or slings as instructed), or further X-rays are indicated. X-rays may sometimes not show a small fracture until a week or 10 days later. Make a follow-up appointment with your own caregiver or one to whom we have referred you. A radiologist (specialist in reading X-rays) may read your X-rays. Make sure you know how you are  to obtain your X-ray results. Do not assume everything is normal if you do not hear from us. SEEK MEDICAL CARE IF: Bruising, swelling, or pain increases. SEEK IMMEDIATE MEDICAL CARE IF:   Your fingers or toes are numb or blue.  The pain is not responding to medications and continues to stay the same or get worse.  The pain in your joint becomes severe.  You develop a fever over 102 F (38.9 C).  It becomes impossible to move or use the joint. MAKE SURE YOU:   Understand these instructions.  Will watch your condition.  Will get help right away if you are not doing well or get worse. Document Released: 03/06/2005 Document Revised: 05/29/2011 Document Reviewed: 10/23/2007 [facility] Patient Information 2014 [place], [place].   Emergency Department Resource Guide 1) Find a Doctor and Pay Out of Pocket Although you won't have to find out who is covered by your insurance plan, it is a good idea to ask around and get recommendations. You will then need to call the office and see if the doctor you have chosen will accept you as a new patient and what types of options they offer for patients who are self-pay. Some doctors offer discounts or will set up payment plans for their patients who do not have insurance, but you will need to ask so you aren't surprised when you get to your appointment.  2) Contact Your Local Health Department Not all health departments have doctors that can see patients for sick visits, but many do, so it is worth a call to see if yours does. If you don't know where your local health department is, you can check in your phone book. The CDC also has a tool to help you locate your state's health department, and many state websites also have listings of all of their local health departments.  3) Find a Walk-in Clinic If your illness is not likely to be very severe or complicated, you may want to try a walk in clinic. These are popping up all over the country in pharmacies,  drugstores, and shopping centers. They're usually staffed by nurse practitioners or physician assistants that have been trained to treat common illnesses and complaints. They're usually fairly quick and inexpensive. However, if you have serious medical issues or chronic medical problems, these are probably not your best option.  No Primary Care Doctor: - Call Health Connect at  [phone] - they can help you locate a primary care doctor that  accepts your insurance, provides certain services, etc. - Physician Referral Service- [phone]  Chronic Pain Problems: Organization         Address  Phone   Notes  [facility]  [phone] Patients need to be referred by their primary care doctor.   Medication Assistance: Organization         Address  Phone   Notes  [facility] [address] [place], [place] [id number] [phone] --Must be a resident of [place] -- Must have NO insurance coverage whatsoever (no Medicaid/ Medicare, etc.) -- The pt. MUST have a primary care doctor  that directs their care regularly and follows them in the community   [facility]  [phone]   [employer]  [phone]    Agencies that provide inexpensive medical care: Organization         Address  Phone   Notes  [facility]  [phone]   [facility]    [phone]   [employer] [address] [place], [place] [address] [phone]   [facility] [address] [place] [place] [phone]   Planned Parenthood    [phone]   [facility]    [phone]   [facility]  [address], [place] Phone:  [phone], Fax:  [phone] Hours of Operation:  9 am - 6 pm, M-F.  Also accepts Medicaid/Medicare and self-pay.  [facility]  [address], [place] Phone:  [phone], Fax: [phone]. Hours of Operation:  8:30 am - 5:30 pm, M-F.  Also accepts Medicaid and self-pay.  [facility] [place] [address], [place] Phone: [phone]   [facility] [address] Natasha Bence [place], [place] [phone], Ext. [phone] Mondays & Thursdays: 7-9 AM.  First 15 patients are seen on a first come, first serve basis.    Medicaid-accepting [facility] Providers:  Organization         Address  Phone   Notes  [facility] [address], [place] [phone] Also accepts self-pay patients.  [facility] [address] Laurell Josephs [place], [place]  [phone]   [facility] [address], [place] [phone]   [facility] [address], [place] [phone]   Renaye Rakers [address], [place]   [phone] Only accepts [place] patients after they have their name applied to their card.   Self-Pay (no insurance) in [facility]:  Organization         Address  Phone   Notes  Sickle Cell Patients, [facility] [address] [place], [place] [phone]   [facility] [address] [place], [place] [phone]   [facility] [place]  [address], [place] [phone]   [facility]/Dr. Osei-Bonsu  [address], [place] or [address], [place] [phone] Phone number for both [place] and [place] locations is the same.  [facility] [address] [place] [phone]   [facility] [address] [place] or [address] [phone]   [facility] [address] [place] [phone], phone; [phone], fax Sees patients 1st and 3rd Saturday of every month.  Must not qualify for public  or private insurance (i.e. Medicaid, Medicare, [place] Health Choice, Veterans' Benefits)  Household income should be no more than 200% of the poverty level The clinic cannot treat you if you are pregnant or think you are pregnant  Sexually transmitted diseases are not treated at the clinic.    Dental Care: Organization         Address  Phone  Notes  [facility] Department of [facility] [address] [place], [place] [phone] Accepts children  up to age 35 who are enrolled in Medicaid or [place] Health Choice; pregnant women with a Medicaid card; and children who have applied for Medicaid or [place] Health Choice, but were declined, whose parents can pay a reduced fee at time of service.  [facility]  [address], [place] [phone] Accepts children up to age 25 who are enrolled in [place] or [place] Health Choice; pregnant women with a Medicaid card; and children who have applied for Medicaid or [place] Health Choice, but were declined, whose parents can pay a reduced fee at time of service.  [facility]  [address] [place], [place] [phone] Patients are seen by appointment only. Walk-ins are not accepted. [facility] will see patients 32 years of age and older. Monday - Tuesday (8am-5pm) Most Wednesdays (8:30-5pm) $30 per visit, cash only  [facility] Adult Dental Access PROGRAM  [address], [facility] [phone] Patients are seen by appointment only. Walk-ins are not accepted. [facility] will see patients 53 years of age and older. One Wednesday Evening (Monthly: Volunteer Based).  $30 per visit, cash only  [employer]  [phone] for adults; Children under age 70, call Graduate Pediatric Dentistry at [phone]. Children aged 57-14, please call [phone] to request a pediatric application.  Dental services are provided in all areas of  dental care including fillings, crowns and bridges, complete and partial dentures, implants, gum treatment, root canals, and extractions. Preventive care is also provided. Treatment is provided to both adults and children. Patients are selected via a lottery and there is often a waiting list.   [facility] [address], [place]  [phone] [email or web address]   [facility] [address] [place], [place] [phone], Ext. [phone] Second and Fourth Thursday of each month, opens at 6:30 AM; Clinic ends at 9 AM.  Patients are seen on a first-come first-served basis, and a limited number are seen during each clinic.   [facility]  [address] Ether Griffins [place], [place] [phone]   Eligibility Requirements You must have lived in [place], [place], or [place] counties for at least the last three months.   You cannot be eligible for state or federal sponsored [place], including [employer], [place], or [employer].   You generally cannot be eligible for healthcare insurance through your employer.    How to apply: Eligibility screenings are held every Tuesday and Wednesday afternoon from 1:00 pm until 4:00 pm. You do not need an appointment for the interview!  [facility] [address] [place], [place] [phone]   [facility] Health Department  [phone]   [facility] Health Department  [phone]   [facility] Health Department  [phone]    Behavioral Health Resources in the Community: Intensive Outpatient Programs Organization         Address  Phone  Notes  [facility] [address], [place], [place] [phone]   [facility] Outpatient [address] [place], [place] [phone]   [facility] [address] [place], [place]  [phone]   [facility] Mental [facility] [address],  [place], [place] [phone] or  [phone]   Substance Abuse Resources Organization         Address  Phone  Notes  [facility] Drug Services  [phone]   [facility]iction  [facility]  [phone]   [facility]  [phone]   [facility]  [phone]   Residential & Outpatient Substance Abuse Program  [phone]   Psychological Services Organization         Address  Phone  Notes  [facility]  [phone]- [phone]   [facility]  [phone]- [phone]   [facility] [address], [place] [phone] or [phone]    Mobile Crisis Teams Organization         Address  Phone  Notes  [facility], Mobile Crisis Care Unit  [phone]   [facility]  [address] [place], [place] [phone]   Doristine LocksSharon DeEsch [address] [place] [phone]    Self-Help/Support Groups Organization         Address  Phone             Notes  [facility] [place] - variety of support groups  336- [phone] Call for more information  Narcotics Anonymous (NA), [facility] [address], [place]  2 meetings at this location   Statisticianesidential Treatment Programs Organization         Address  Phone  Notes  [facility] [address],    [place]  [phone]   [facility]  [address], [place] [phone], [place], [place] [phone]   [facility] [address], [place] [phone] Admissions: 8am-3pm M-F  [facility] [address]    [place], [place] [phone]   [facility] [address], [place], [place] [phone]   [facility] [address]  [place], [place] [phone]   [facility] - Intensive Outpatient [address], [place], [place] [phone]   [facility] [address]  [place], [place] [phone] or [phone]   Residential  Treatment Services (RTS) [address] [place], [place] [phone] Accepts Medicaid  [facility] [address]  [place] [phone] Substance Abuse/Addiction Treatment   [facility] Behavioral Health Resources Organization         Address  Phone  Notes  [facility]  [phone]) [phone]   Angie FavaJulie Brannon, PhD [address] [place], [place]   [phone] or [phone]) [phone]   [facility]   [address] [place], [place] [phone](336) [phone]   [facility] [address], [place], [place] [phone] Insurance/Medicaid/sponsorship through [facility]  [facility] [address]                                    [place], [place] [phone] Therapy/tele-psych/case  [facility] [address]   [place], [place] [phone]    Dr. Lolly MustacheArfeen  [phone](336) [phone]   [facility]  [employer] [facility] 1) [address], [place] 2) [address], [place] 3)  [address] [place] [address], [place] [phone](336) [phone]  [phone]   [facility] Child Abuse Hotline [phone](336) [phone] or [phone]) [phone] (After Hours)

## 2013-06-29 NOTE — ED Provider Notes (Signed)
Medical screening examination/treatment/procedure(s) were performed by non-physician practitioner and as supervising physician I was immediately available for consultation/collaboration.   EKG Interpretation None        David H Yao, MD 06/29/13 1509 

## 2014-05-11 ENCOUNTER — Emergency Department (HOSPITAL_COMMUNITY): Payer: Medicaid Other

## 2014-05-11 ENCOUNTER — Emergency Department (HOSPITAL_COMMUNITY)
Admission: EM | Admit: 2014-05-11 | Discharge: 2014-05-11 | Disposition: A | Discharge status: Home / Self Care | Payer: Medicaid Other | Attending: Emergency Medicine | Admitting: Emergency Medicine

## 2014-05-11 ENCOUNTER — Encounter (HOSPITAL_COMMUNITY): Payer: Self-pay | Admitting: Family Medicine

## 2014-05-11 DIAGNOSIS — Y9289 Other specified places as the place of occurrence of the external cause: Secondary | ICD-10-CM | POA: Insufficient documentation

## 2014-05-11 DIAGNOSIS — W1830XA Fall on same level, unspecified, initial encounter: Secondary | ICD-10-CM | POA: Insufficient documentation

## 2014-05-11 DIAGNOSIS — Z8669 Personal history of other diseases of the nervous system and sense organs: Secondary | ICD-10-CM | POA: Insufficient documentation

## 2014-05-11 DIAGNOSIS — Z8701 Personal history of pneumonia (recurrent): Secondary | ICD-10-CM | POA: Insufficient documentation

## 2014-05-11 DIAGNOSIS — I252 Old myocardial infarction: Secondary | ICD-10-CM | POA: Insufficient documentation

## 2014-05-11 DIAGNOSIS — Z720 Tobacco use: Secondary | ICD-10-CM | POA: Insufficient documentation

## 2014-05-11 DIAGNOSIS — G8929 Other chronic pain: Secondary | ICD-10-CM | POA: Insufficient documentation

## 2014-05-11 DIAGNOSIS — Z8673 Personal history of transient ischemic attack (TIA), and cerebral infarction without residual deficits: Secondary | ICD-10-CM | POA: Insufficient documentation

## 2014-05-11 DIAGNOSIS — Z8659 Personal history of other mental and behavioral disorders: Secondary | ICD-10-CM | POA: Insufficient documentation

## 2014-05-11 DIAGNOSIS — S20211A Contusion of right front wall of thorax, initial encounter: Secondary | ICD-10-CM | POA: Insufficient documentation

## 2014-05-11 DIAGNOSIS — Y998 Other external cause status: Secondary | ICD-10-CM | POA: Insufficient documentation

## 2014-05-11 DIAGNOSIS — Y9389 Activity, other specified: Secondary | ICD-10-CM | POA: Insufficient documentation

## 2014-05-11 DIAGNOSIS — Z8719 Personal history of other diseases of the digestive system: Secondary | ICD-10-CM | POA: Insufficient documentation

## 2014-05-11 DIAGNOSIS — Z8739 Personal history of other diseases of the musculoskeletal system and connective tissue: Secondary | ICD-10-CM | POA: Insufficient documentation

## 2014-05-11 DIAGNOSIS — I10 Essential (primary) hypertension: Secondary | ICD-10-CM | POA: Insufficient documentation

## 2014-05-11 MED ORDER — OXYCODONE-ACETAMINOPHEN 5-325 MG PO TABS
1.0000 | ORAL_TABLET | Freq: Once | ORAL | Status: AC
  Administered 2014-05-11: 1 via ORAL
  Filled 2014-05-11: qty 1

## 2014-05-11 MED ORDER — HYDROCODONE-ACETAMINOPHEN 5-325 MG PO TABS: 1.0000 | ORAL_TABLET | Freq: Four times a day (QID) | ORAL | Status: DC | PRN

## 2014-05-11 MED ORDER — IBUPROFEN 400 MG PO TABS
600.0000 mg | ORAL_TABLET | Freq: Once | ORAL | Status: AC
  Administered 2014-05-11: 600 mg via ORAL
  Filled 2014-05-11 (×2): qty 1

## 2014-05-11 MED ORDER — IBUPROFEN 800 MG PO TABS: 800.0000 mg | ORAL_TABLET | Freq: Three times a day (TID) | ORAL | Status: DC | PRN

## 2014-05-11 NOTE — ED Provider Notes (Signed)
CSN: [id number]     Arrival date & time 05/11/14  1104 History   First MD Initiated Contact with Patient 05/11/14 1359     Chief Complaint  Patient presents with  . Rib Injury     (Consider location/radiation/quality/duration/timing/severity/associated sxs/prior Treatment) HPI Patient presents to the emergency department with right rib pain following a fall last night where he injured his right ribs.  Patient states he has pain with palpation and movement of the right ribs and states that coughing makes the pain worse.  Patient states that he did not injure himself in any other way.  Patient did not take any medications prior to arrival.  Patient denies shortness of breath, nausea, vomiting, abdominal pain, headache, blurred vision, weakness, dizziness, back pain, neck pain, extremity injury or syncope.  The patient states that he fell against a concrete abutment Past Medical History  Diagnosis Date  . Back pain, chronic   . Hypertension   . AAA (abdominal aortic aneurysm) 10/2010    4.1cm on CT scan  . Myocardial infarction     "think so; when I was young; used to go to heart dr"  . Pneumonia   . Chronic bronchitis 11/08/2011    "get it q month"  . Shortness of breath 11/08/2011    "lying down"  . Mental disorder 11/08/2011    "I get crazy; I take medicine"  . Depression   . Anxiety   . Bipolar 1 disorder   . Schizo-affective schizophrenia   . GERD (gastroesophageal reflux disease)   . History of stomach ulcers 11/08/2011  . Hepatitis 11/08/2011    "not sure which one"  . Daily headache 11/08/2011  . Stroke 11/08/2011    points to right chest  . 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 Burning with urination 11/08/2011  . Arthritis 11/08/2011    "feet and knees"   Past Surgical History  Procedure Laterality Date  . Cholecystectomy  2008  . Eus  11/14/2011    Procedure: FULL UPPER ENDOSCOPIC ULTRASOUND (EUS) RADIAL;  Surgeon: Willis Modena, MD;  Location: [facility];  Service:  Endoscopy;  Laterality: N/A;  [facility] be carelinked from [facility]   Family History  Problem Relation Age of Onset  . Hypertension Mother    History  Substance Use Topics  . Smoking status: Current Every Day Smoker -- 0.50 packs/day for 29 years    Types: Cigarettes  . Smokeless tobacco: Never Used  . Alcohol Use: 3.0 oz/week    5 Cans of beer per week     Comment: 11/08/2011 "4-5 bottles beer/wk"    Review of Systems  All other systems negative except as documented in the HPI. All pertinent positives and negatives as reviewed in the HPI.  Allergies  Review of patient's allergies indicates no known allergies.  Home Medications   Prior to Admission medications   Not on File   BP 168/104 mmHg  Pulse 69  Temp(Src) 98.6 F (37 C)  Resp 16  Ht  (1.626 m)  Wt 189 lb (85.73 kg)  BMI 32.43 kg/m2  SpO2 98% Physical Exam  Constitutional: He is oriented to person, place, and time. He appears well-developed and well-nourished. No distress.  HENT:  Head: Normocephalic and atraumatic.  Mouth/Throat: Oropharynx is clear and moist.  Eyes: Pupils are equal, round, and reactive to light.  Neck: Normal range of motion. Neck supple.  Cardiovascular: Normal rate, regular rhythm and normal heart sounds.  Exam reveals no gallop and  no friction rub.   No murmur heard. Pulmonary/Chest: Effort normal and breath sounds normal. No respiratory distress. He has no wheezes. He has no rales. He exhibits tenderness and bony tenderness. He exhibits no laceration, no crepitus, no deformity, no swelling and no retraction.    Neurological: He is alert and oriented to person, place, and time.  Skin: Skin is warm and dry. No rash noted. No erythema.  Nursing note and vitals reviewed.   ED Course  Procedures (including critical care time) Labs Review Labs Reviewed - No data to display  Imaging Review Dg Ribs Unilateral W/chest Right  05/11/2014   CLINICAL DATA:  Patient fell onto right side. Pain along  the lower anterior right ribs  EXAM: RIGHT RIBS AND CHEST - 3+ VIEW  COMPARISON:  Chest radiograph January 31, 2011  FINDINGS: Frontal chest as well as oblique and cone-down lower rib images were obtained. There is minimal scarring in the left base. Lungs elsewhere clear. Heart size pulmonary vascularity are normal. No adenopathy. There is no effusion or pneumothorax. There is no demonstrable rib fracture.  IMPRESSION: No edema or consolidation.  No demonstrable rib fracture.   Electronically Signed   By: Bretta BangWilliam  Woodruff III M.D.   On: 05/11/2014 13:01   Patient be treated for contusion of the ribs.  I advised patient to return here as needed.  Told to follow up with a primary care doctor.  Patient agrees to the plan and all questions were answered   MDM   Final diagnoses:  None       Carlyle DollyChristopher W David Towson, PA-C 05/11/14 1508  Richardean Canalavid H Yao, MD 05/11/14 [phone]

## 2014-05-11 NOTE — ED Notes (Signed)
Pt here for right rib pain. Sts fell last night.

## 2014-05-11 NOTE — Discharge Instructions (Signed)
Return here as needed.  Follow-up with a primary care doctor.  Your x-rays did not show any rib fractures.  Use ice and heat on the area that is sore

## 2014-05-13 ENCOUNTER — Emergency Department (HOSPITAL_COMMUNITY)
Admission: EM | Admit: 2014-05-13 | Discharge: 2014-05-13 | Disposition: A | Discharge status: Home / Self Care | Payer: Medicaid Other | Attending: Emergency Medicine | Admitting: Emergency Medicine

## 2014-05-13 ENCOUNTER — Encounter (HOSPITAL_COMMUNITY): Payer: Self-pay | Admitting: Family Medicine

## 2014-05-13 DIAGNOSIS — Z8709 Personal history of other diseases of the respiratory system: Secondary | ICD-10-CM | POA: Insufficient documentation

## 2014-05-13 DIAGNOSIS — Z8701 Personal history of pneumonia (recurrent): Secondary | ICD-10-CM | POA: Insufficient documentation

## 2014-05-13 DIAGNOSIS — M179 Osteoarthritis of knee, unspecified: Secondary | ICD-10-CM | POA: Insufficient documentation

## 2014-05-13 DIAGNOSIS — W1830XD Fall on same level, unspecified, subsequent encounter: Secondary | ICD-10-CM | POA: Insufficient documentation

## 2014-05-13 DIAGNOSIS — Z8673 Personal history of transient ischemic attack (TIA), and cerebral infarction without residual deficits: Secondary | ICD-10-CM | POA: Insufficient documentation

## 2014-05-13 DIAGNOSIS — Z8659 Personal history of other mental and behavioral disorders: Secondary | ICD-10-CM | POA: Insufficient documentation

## 2014-05-13 DIAGNOSIS — M940 Chondrocostal junction syndrome [Tietze]: Secondary | ICD-10-CM

## 2014-05-13 DIAGNOSIS — S29002D Unspecified injury of muscle and tendon of back wall of thorax, subsequent encounter: Secondary | ICD-10-CM | POA: Insufficient documentation

## 2014-05-13 DIAGNOSIS — I10 Essential (primary) hypertension: Secondary | ICD-10-CM | POA: Insufficient documentation

## 2014-05-13 DIAGNOSIS — Z720 Tobacco use: Secondary | ICD-10-CM | POA: Insufficient documentation

## 2014-05-13 DIAGNOSIS — Z8719 Personal history of other diseases of the digestive system: Secondary | ICD-10-CM | POA: Insufficient documentation

## 2014-05-13 DIAGNOSIS — I252 Old myocardial infarction: Secondary | ICD-10-CM | POA: Insufficient documentation

## 2014-05-13 DIAGNOSIS — G8929 Other chronic pain: Secondary | ICD-10-CM | POA: Insufficient documentation

## 2014-05-13 DIAGNOSIS — S20211D Contusion of right front wall of thorax, subsequent encounter: Secondary | ICD-10-CM

## 2014-05-13 MED ORDER — KETOROLAC TROMETHAMINE 30 MG/ML IJ SOLN
30.0000 mg | Freq: Once | INTRAMUSCULAR | Status: AC
  Administered 2014-05-13: 30 mg via INTRAMUSCULAR
  Filled 2014-05-13: qty 1

## 2014-05-13 NOTE — ED Notes (Signed)
Pt having rib pain and back pain still from fall a few days ago. sts was unable to get pain meds.

## 2014-05-13 NOTE — Discharge Instructions (Signed)
Use the incentive spirometer as discussed, turn and cough every 2 hours as discussed during your visit. Use heat or ice for 20 minutes every hour to help with the pain. Fill your prescriptions from your last visit, or use ibuprofen and tylenol to help with pain. Set up ongoing medical care at [facility]. Return to the ER for changes or worsening symptoms.   Vim s?n s??n (Costochondritis) Vim s?n s??n, ?i khi ???c g?i l h?i ch?ng Tietze, l tnh tr?ng s?ng v kch ?ng (vim) cc m (s?n) n?i x??ng s??n v?i x??ng ng?c (x??ng ?c). N gy ?au ? ng?c v khu v?c x??ng s??n. Vim s?n s??n th??ng t? kh?i sau m?t th?i gian. C th? m?t t?i 6 tu?n ho?c lu h?n ?? b?nh ?? h?n, ??c bi?t l n?u qu v? khng th? h?n ch? cc ho?t ??ng c?a mnh. NGUYN NHN  M?t s? tr??ng h?p vim s?n s??n khng r nguyn nhn. Nh?ng nguyn nhn c th? c bao g?m:  T?n th??ng (ch?n th??ng).  T?p th? d?c ho?c ho?t ??ng ch?ng h?n nh? nng nh?c.  Ho d? d?i. D?U HI?U V TRI?U CH?NG  ?au v c?m gic ?au ? ng?c v khu v?c x??ng s??n.  ?au n?ng h?n khi ho ho?c th? su.  ?au n?ng h?n khi c cc c? ??ng ring bi?t. CH?N ?ON  Chuyn gia ch?m [place] s?c kh?e c?a qu v? s? khm th?c th? v h?i v? nh?ng tri?u ch?ng c?a qu v?. C th? ch?p X quang l?ng ng?c ho?c cc xt nghi?m khc ?? lo?i tr? nh?ng v?n ?? khc. ?I?U TR?  Vim s?n s??n th??ng t? kh?i sau m?t th?i gian. Chuyn gia ch?m [place] s?c kh?e c th? k ??n thu?c ?? gi?m ?au. H??NG D?N CH?M [place] T?I NH   Trnh ho?t ??ng th? l?c g?ng s?c. C? g?ng khng ko c?ng x??ng s??n trong khi ho?t ??ng bnh th??ng. ?i?u ny c th? bao g?m b?t k? ho?t ??ng no c s? d?ng c? ng?c, b?ng, v ? m?ng s??n, ??c bi?t l n?u s? d?ng cc v?t n?ng.  Ch??m ? vo vng b? ?nh h??ng trong 2 ngy ??u sau khi b?t ??u ?au.  Cho ? vo ti nh?a.  ?? kh?n t?m vo gi?a da v ti.  ?? ? l?nh trong kho?ng 20 pht, 2 - 3 l?n m?t ngy.  Ch? s? d?ng thu?c khng c?n k ??n ho?c thu?c c?n k  ??n theo ch? d?n c?a chuyn gia ch?m  s?c kh?e. ?I KHM N?U:  Qu v? b? t?y ?? ho?c s?ng ? cc kh?p x??ng s??n. ?y l nh?ng d?u hi?u nhi?m trng.  C?n ?au c?a qu v? khng h?t m?c d ? ngh? ng?i ho?c dng thu?c. NGAY L?P T?C ?I KHM N?U:   C?n ?au c?a qu v? t?ng ln ho?c c?m th?y r?t kh ch?u.  Qu v? b? th? d?c ho?c kh th?.  Qu v? ho ra mu.  Qu v? b? ?au ng?c n?ng h?n, ?? m? hi, ho?c nn m?a.  Qu v? b? s?t ho?c cc tri?u ch?ng ko di h?n 2 - 3 ngy.  Qu v? b? s?t v cc tri?u ch?ng c?a qu v? ??t nhin n?ng ln. ??M B?O QU V?:   Hi?u cc h??ng d?n ny.  S? theo di tnh tr?ng c?a mnh.  S? yu c?u tr? gip ngay l?p t?c n?u b?n c?m th?y khng kh?e ho?c th?y tr?m tr?ng h?n. Document Released: 12/14/2004 Document Revised: 12/25/2012 ExitCare Patient Information  2015 [employer]. This information is not intended to replace advice given to you by your health care provider. Make sure you discuss any questions you have with your health care provider. ° °

## 2014-05-13 NOTE — ED Provider Notes (Signed)
CSN: [id number]     Arrival date & time 05/13/14  4098 History   None    Chief Complaint  Patient presents with  . Fall     (Consider location/radiation/quality/duration/timing/severity/associated sxs/prior Treatment) HPI Comments: Dustin Hansen is a 44 y.o. male with a PMHx of chronic back pain, HTN, AAA, ?MI in 2012, chronic bronchitis, depression, anxiety, bipolar 1, schizoaffective schizophrenia, GERD, PUD, chronic headaches, and arthritis, who presents to the ED with complaints of ongoing pain after a fall 3 days ago. He reports right-sided rib wall pain laterally as well as anteriorly and posteriorly. He reports that it is 10/10 sharp constant nonradiating worse with coughing and with no known alleviating factors given that he has not tried his medications that were prescribed to him on 2/22 when he was seen in the ER. He denies any fevers, chills, chest pain, shortness of breath, abdominal pain, nausea, vomiting, diarrhea, dysuria, hematuria, numbness, tingling, weakness, midline back pain, flank pain, cauda equina symptoms, bruising, or abrasions. Denies any headaches or neurologic deficits.  Patient is a 44 y.o. male presenting with fall. The history is provided by the patient. No language interpreter was used.  Fall This is a new problem. The current episode started in the past 7 days. The problem occurs rarely. The problem has been unchanged. Pertinent negatives include no abdominal pain, arthralgias, chest pain, chills, coughing, fever, headaches, myalgias, nausea, neck pain, numbness, rash, vomiting or weakness. The symptoms are aggravated by coughing. He has tried nothing for the symptoms. The treatment provided no relief.    Past Medical History  Diagnosis Date  . Back pain, chronic   . Hypertension   . AAA (abdominal aortic aneurysm) 10/2010    4.1cm on CT scan  . Myocardial infarction     "think so; when I was young; used to go to heart dr"  . Pneumonia   . Chronic bronchitis  11/08/2011    "get it q month"  . Shortness of breath 11/08/2011    "lying down"  . Mental disorder 11/08/2011    "I get crazy; I take medicine"  . Depression   . Anxiety   . Bipolar 1 disorder   . Schizo-affective schizophrenia   . GERD (gastroesophageal reflux disease)   . History of stomach ulcers 11/08/2011  . Hepatitis 11/08/2011    "not sure which one"  . Daily headache 11/08/2011  . Stroke 11/08/2011    points to right chest  . 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 Burning with urination 11/08/2011  . Arthritis 11/08/2011    "feet and knees"   Past Surgical History  Procedure Laterality Date  . Cholecystectomy  2008  . Eus  11/14/2011    Procedure: FULL UPPER ENDOSCOPIC ULTRASOUND (EUS) RADIAL;  Surgeon: Willis Modena, MD;  Location: [facility];  Service: Endoscopy;  Laterality: N/A;  to be carelinked from [facility]   Family History  Problem Relation Age of Onset  . Hypertension Mother    History  Substance Use Topics  . Smoking status: Current Every Day Smoker -- 0.50 packs/day for 29 years    Types: Cigarettes  . Smokeless tobacco: Never Used  . Alcohol Use: 3.0 oz/week    5 Cans of beer per week     Comment: 11/08/2011 "4-5 bottles beer/wk"    Review of Systems  Constitutional: Negative for fever and chills.  Respiratory: Negative for cough and shortness of breath.   Cardiovascular: Negative for chest pain.  Gastrointestinal: Negative for nausea,  vomiting, abdominal pain, diarrhea and constipation.  Genitourinary: Negative for dysuria, hematuria and flank pain.  Musculoskeletal: Negative for myalgias, back pain, arthralgias, gait problem and neck pain.       +rib cage wall pain R sided anteriorly/posteriorly/laterally  Skin: Negative for rash.  Neurological: Negative for weakness, numbness and headaches.  Hematological: Does not bruise/bleed easily.  Psychiatric/Behavioral: Negative for confusion.  10 Systems reviewed and are negative for acute change except as  noted in the HPI.     Allergies  Review of patient's allergies indicates no known allergies.  Home Medications   Prior to Admission medications   Medication Sig Start Date End Date Taking? Authorizing Provider  HYDROcodone-acetaminophen (NORCO/VICODIN) 5-325 MG per tablet Take 1 tablet by mouth every 6 (six) hours as needed for moderate pain. 05/11/14   Jamesetta Orleans Lawyer, PA-C  ibuprofen (ADVIL,MOTRIN) 800 MG tablet Take 1 tablet (800 mg total) by mouth every 8 (eight) hours as needed. 05/11/14   Jamesetta Orleans Lawyer, PA-C   BP 155/110 mmHg  Pulse 93  Temp(Src) 97.9 F (36.6 C)  Resp 18  SpO2 96% Physical Exam  Constitutional: He is oriented to person, place, and time. He appears well-developed and well-nourished.  Non-toxic appearance. No distress.  Afebrile, nontoxic, NAD, baseline HTN noted  HENT:  Head: Normocephalic and atraumatic.  Mouth/Throat: Oropharynx is clear and moist and mucous membranes are normal.  Eyes: Conjunctivae and EOM are normal. Right eye exhibits no discharge. Left eye exhibits no discharge.  Neck: Normal range of motion. Neck supple.  Cardiovascular: Normal rate, regular rhythm, normal heart sounds and intact distal pulses.  Exam reveals no gallop and no friction rub.   No murmur heard. Pulmonary/Chest: Effort normal and breath sounds normal. No respiratory distress. He has no decreased breath sounds. He has no wheezes. He has no rhonchi. He has no rales.   He exhibits tenderness and bony tenderness. He exhibits no crepitus, no deformity and no retraction.    CTAB in all lung fields, no w/r/r, no hypoxia or increased WOB, speaking in full sentences, SpO2 96% on RA  Chest wall tenderness in the R midaxillary line as well as extending along costal margin anteriorly and posteriorly. No bony crepitus or deformity, no abrasion or bruising, no retraction.  Abdominal: Soft. Normal appearance and bowel sounds are normal. He exhibits no distension. There is no  tenderness. There is no rigidity, no rebound, no guarding, no CVA tenderness, no tenderness at McBurney's point and negative Murphy's sign.  Musculoskeletal: Normal range of motion.       Lumbar back: Normal.  All spinal levels with FROM intact without spinous process TTP, no bony stepoffs or deformities, no paraspinous muscle TTP or muscle spasms. Rib cage tenderness as noted above MAE x4. Strength 5/5 in all extremities, sensation grossly intact in all extremities, negative SLR bilaterally, gait steady and nonanalgic. No overlying skin changes.   Neurological: He is alert and oriented to person, place, and time. He has normal strength. No sensory deficit.  Skin: Skin is warm, dry and intact. No abrasion, no bruising and no rash noted.  No abrasions or bruising  Psychiatric: He has a normal mood and affect.  Nursing note and vitals reviewed.   ED Course  Procedures (including critical care time) Labs Review Labs Reviewed - No data to display  Imaging Review Dg Ribs Unilateral W/chest Right  05/11/2014   CLINICAL DATA:  Patient fell onto right side. Pain along the lower anterior right ribs  EXAM:  RIGHT RIBS AND CHEST - 3+ VIEW  COMPARISON:  Chest radiograph January 31, 2011  FINDINGS: Frontal chest as well as oblique and cone-down lower rib images were obtained. There is minimal scarring in the left base. Lungs elsewhere clear. Heart size pulmonary vascularity are normal. No adenopathy. There is no effusion or pneumothorax. There is no demonstrable rib fracture.  IMPRESSION: No edema or consolidation.  No demonstrable rib fracture.   Electronically Signed   By: Bretta BangWilliam  Woodruff III M.D.   On: 05/11/2014 13:01     EKG Interpretation None      MDM   Final diagnoses:  Rib contusion, right, subsequent encounter  Costochondritis, acute  Essential hypertension    44 y.o. male with ongoing right rib cage pain. Seen on 05/11/14 with negative CXR. Doubt need for repeat xray. Pt splinting  with breathing, discussed importance of incentive spirometry and coughing q2h while awake. Pt doesn't have PCP, and having difficulty affording meds. Will have him f/up with [facility]. Will give toradol here and d/c home, pt already got prescriptions for ibuprofen and hydrocodone yesterday, will have him fill those at  and wellness. Of note, HTN on exam, but this is his baseline and he is asymptomatic. Doubt need for further labs/imaging. Will d/c home after incentive spiro given and toradol. I explained the diagnosis and have given explicit precautions to return to the ER including for any other new or worsening symptoms. The patient understands and accepts the medical plan as it's been dictated and I have answered their questions. Discharge instructions concerning home care and prescriptions have been given. The patient is STABLE and is discharged to home in good condition.  BP 155/110 mmHg  Pulse 93  Temp(Src) 97.9 F (36.6 C)  Resp 18  SpO2 96%  Meds ordered this encounter  Medications  . ketorolac (TORADOL) 30 MG/ML injection 30 mg    Sig:        Donnita FallsMercedes Strupp Maguayoamprubi-Soms, PA-C 05/13/14 [id number]  Gilda Creasehristopher J. Pollina, MD 05/13/14 [phone]

## 2014-05-14 ENCOUNTER — Emergency Department (HOSPITAL_COMMUNITY)
Admission: EM | Admit: 2014-05-14 | Discharge: 2014-05-14 | Disposition: A | Discharge status: Home / Self Care | Payer: Medicaid Other | Attending: Emergency Medicine | Admitting: Emergency Medicine

## 2014-05-14 ENCOUNTER — Encounter (HOSPITAL_COMMUNITY): Payer: Self-pay | Admitting: *Deleted

## 2014-05-14 DIAGNOSIS — Z8709 Personal history of other diseases of the respiratory system: Secondary | ICD-10-CM | POA: Insufficient documentation

## 2014-05-14 DIAGNOSIS — Z8701 Personal history of pneumonia (recurrent): Secondary | ICD-10-CM | POA: Insufficient documentation

## 2014-05-14 DIAGNOSIS — Z760 Encounter for issue of repeat prescription: Secondary | ICD-10-CM | POA: Insufficient documentation

## 2014-05-14 DIAGNOSIS — M179 Osteoarthritis of knee, unspecified: Secondary | ICD-10-CM | POA: Insufficient documentation

## 2014-05-14 DIAGNOSIS — G8929 Other chronic pain: Secondary | ICD-10-CM | POA: Insufficient documentation

## 2014-05-14 DIAGNOSIS — Z8673 Personal history of transient ischemic attack (TIA), and cerebral infarction without residual deficits: Secondary | ICD-10-CM | POA: Insufficient documentation

## 2014-05-14 DIAGNOSIS — Z8719 Personal history of other diseases of the digestive system: Secondary | ICD-10-CM | POA: Insufficient documentation

## 2014-05-14 DIAGNOSIS — I255 Ischemic cardiomyopathy: Secondary | ICD-10-CM | POA: Insufficient documentation

## 2014-05-14 DIAGNOSIS — Z590 Homelessness: Secondary | ICD-10-CM | POA: Insufficient documentation

## 2014-05-14 DIAGNOSIS — Z720 Tobacco use: Secondary | ICD-10-CM | POA: Insufficient documentation

## 2014-05-14 DIAGNOSIS — I10 Essential (primary) hypertension: Secondary | ICD-10-CM | POA: Insufficient documentation

## 2014-05-14 DIAGNOSIS — R079 Chest pain, unspecified: Secondary | ICD-10-CM | POA: Insufficient documentation

## 2014-05-14 DIAGNOSIS — Z8659 Personal history of other mental and behavioral disorders: Secondary | ICD-10-CM | POA: Insufficient documentation

## 2014-05-14 DIAGNOSIS — R0781 Pleurodynia: Secondary | ICD-10-CM

## 2014-05-14 MED ORDER — IBUPROFEN 400 MG PO TABS
800.0000 mg | ORAL_TABLET | Freq: Once | ORAL | Status: AC
  Administered 2014-05-14: 800 mg via ORAL
  Filled 2014-05-14: qty 2

## 2014-05-14 MED ORDER — HYDROCODONE-ACETAMINOPHEN 5-325 MG PO TABS
1.0000 | ORAL_TABLET | Freq: Once | ORAL | Status: AC
  Administered 2014-05-14: 1 via ORAL
  Filled 2014-05-14: qty 1

## 2014-05-14 NOTE — Discharge Planning (Signed)
NCM to pick up prescriptions from outpatient pharmacy for pt.

## 2014-05-14 NOTE — ED Notes (Signed)
Pt fell three days ago and has returned for 3 days because he can not pay for meds .

## 2014-05-14 NOTE — Discharge Planning (Signed)
Jhace Fennell J. Clydene Laming, RN, [place], [place] [phone] ED CM consulted to meet with patient concerning f/u care with PCP and patient does not have insurance. Pt presented to [facility] ED today with back pain.  Met with patient at bedside, confirmed informaton. Pt  reports not having access to f/u care with PCP, or insurance coverage. Discussed with patient importance and benefits of  establishing PCP, and not utilizing the ED for primary care needs. Pt verbalized understanding and is in agreement. Discussed other options, provided list of local  affordable PCPs.  Pt voiced interest in the [employer].  [facility] Brochure given with address, phone number, and the services highlighted. Explained that there is a Customer service manager Saintclair Halsted) on site who will assist with [employer] and process. Instructed to walk in Friday, March 11 at to schedule appt for establishing care for PCP. Pt verbalized understanding.

## 2014-05-14 NOTE — ED Notes (Signed)
Social Worker will get Meds filled  At [facility] . Plan is to provide Pt with actual medications  and a [facility]  On March 11,2016.  Plan to assist Patient with orange card .
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
## 2014-05-14 NOTE — Discharge Instructions (Signed)
Take your medications as precribed. [facility]-- they will call and remind you prior to your appt. Return to the ED for new concerns.

## 2014-05-14 NOTE — Progress Notes (Signed)
[facility] & Eligibility Specialist  Patient was [facility] March 11,2016 at 12:00, pt verbalized understanding of this plan. Patient will get established with care at the onsite clinic once enrollment is complete. Orange card application and my contact information provided for any future questions or concerns.

## 2014-08-09 ENCOUNTER — Emergency Department (HOSPITAL_COMMUNITY): Payer: Medicaid Other

## 2014-08-09 ENCOUNTER — Emergency Department (HOSPITAL_COMMUNITY)
Admission: EM | Admit: 2014-08-09 | Discharge: 2014-08-10 | Disposition: A | Discharge status: Home / Self Care | Payer: Medicaid Other | Attending: Emergency Medicine | Admitting: Emergency Medicine

## 2014-08-09 ENCOUNTER — Encounter (HOSPITAL_COMMUNITY): Payer: Self-pay

## 2014-08-09 DIAGNOSIS — G8929 Other chronic pain: Secondary | ICD-10-CM | POA: Insufficient documentation

## 2014-08-09 DIAGNOSIS — Y998 Other external cause status: Secondary | ICD-10-CM | POA: Insufficient documentation

## 2014-08-09 DIAGNOSIS — Z8673 Personal history of transient ischemic attack (TIA), and cerebral infarction without residual deficits: Secondary | ICD-10-CM | POA: Insufficient documentation

## 2014-08-09 DIAGNOSIS — Y92410 Unspecified street and highway as the place of occurrence of the external cause: Secondary | ICD-10-CM | POA: Insufficient documentation

## 2014-08-09 DIAGNOSIS — Z8719 Personal history of other diseases of the digestive system: Secondary | ICD-10-CM | POA: Insufficient documentation

## 2014-08-09 DIAGNOSIS — I252 Old myocardial infarction: Secondary | ICD-10-CM | POA: Insufficient documentation

## 2014-08-09 DIAGNOSIS — Y9389 Activity, other specified: Secondary | ICD-10-CM | POA: Insufficient documentation

## 2014-08-09 DIAGNOSIS — Z8701 Personal history of pneumonia (recurrent): Secondary | ICD-10-CM | POA: Insufficient documentation

## 2014-08-09 DIAGNOSIS — F10929 Alcohol use, unspecified with intoxication, unspecified: Secondary | ICD-10-CM

## 2014-08-09 DIAGNOSIS — I10 Essential (primary) hypertension: Secondary | ICD-10-CM | POA: Insufficient documentation

## 2014-08-09 DIAGNOSIS — W01198A Fall on same level from slipping, tripping and stumbling with subsequent striking against other object, initial encounter: Secondary | ICD-10-CM | POA: Insufficient documentation

## 2014-08-09 DIAGNOSIS — M159 Polyosteoarthritis, unspecified: Secondary | ICD-10-CM | POA: Insufficient documentation

## 2014-08-09 DIAGNOSIS — S0181XA Laceration without foreign body of other part of head, initial encounter: Secondary | ICD-10-CM | POA: Insufficient documentation

## 2014-08-09 DIAGNOSIS — F10220 Alcohol dependence with intoxication, uncomplicated: Secondary | ICD-10-CM | POA: Insufficient documentation

## 2014-08-09 DIAGNOSIS — S0990XA Unspecified injury of head, initial encounter: Secondary | ICD-10-CM

## 2014-08-09 DIAGNOSIS — F1029 Alcohol dependence with unspecified alcohol-induced disorder: Secondary | ICD-10-CM

## 2014-08-09 DIAGNOSIS — Z8709 Personal history of other diseases of the respiratory system: Secondary | ICD-10-CM | POA: Insufficient documentation

## 2014-08-09 DIAGNOSIS — Z720 Tobacco use: Secondary | ICD-10-CM | POA: Insufficient documentation

## 2014-08-09 LAB — COMPREHENSIVE METABOLIC PANEL
ALBUMIN: 4.5 g/dL (ref 3.5–5.0)
ALT: 12 U/L — AB (ref 17–63)
AST: 23 U/L (ref 15–41)
Alkaline Phosphatase: 62 U/L (ref 38–126)
Anion gap: 15 (ref 5–15)
BILIRUBIN TOTAL: 0.6 mg/dL (ref 0.3–1.2)
BUN: 9 mg/dL (ref 6–20)
CO2: 23 mmol/L (ref 22–32)
Calcium: 8.7 mg/dL — ABNORMAL LOW (ref 8.9–10.3)
Chloride: 109 mmol/L (ref 101–111)
Creatinine, Ser: 0.75 mg/dL (ref 0.61–1.24)
GFR calc Af Amer: >60 mL/min (ref >60)
GFR calc non Af Amer: >60 mL/min (ref >60)
Glucose, Bld: 92 mg/dL (ref 65–99)
Potassium: 3.4 mmol/L — ABNORMAL LOW (ref 3.5–5.1)
Sodium: 147 mmol/L — ABNORMAL HIGH (ref 135–145)
Total Protein: 7.2 g/dL (ref 6.5–8.1)

## 2014-08-09 LAB — CBC WITH DIFFERENTIAL/PLATELET
BASOS ABS: 0 10*3/uL (ref 0.0–0.1)
Basophils Relative: 0 % (ref 0–1)
EOS PCT: 1 % (ref 0–5)
Eosinophils Absolute: 0.1 10*3/uL (ref 0.0–0.7)
HCT: 49.4 % (ref 39.0–52.0)
Hemoglobin: 17.3 g/dL — ABNORMAL HIGH (ref 13.0–17.0)
Lymphocytes Relative: 35 % (ref 12–46)
Lymphs Abs: 3.1 10*3/uL (ref 0.7–4.0)
MCH: 31.1 pg (ref 26.0–34.0)
MCHC: 35 g/dL (ref 30.0–36.0)
MCV: 88.8 fL (ref 78.0–100.0)
MONO ABS: 0.3 10*3/uL (ref 0.1–1.0)
Monocytes Relative: 4 % (ref 3–12)
NEUTROS ABS: 5.4 10*3/uL (ref 1.7–7.7)
NEUTROS PCT: 60 % (ref 43–77)
Platelets: 199 10*3/uL (ref 150–400)
RBC: 5.56 MIL/uL (ref 4.22–5.81)
RDW: 14.7 % (ref 11.5–15.5)
WBC: 8.9 10*3/uL (ref 4.0–10.5)

## 2014-08-09 LAB — PROTIME-INR
INR: 1.19 (ref 0.00–1.49)
Prothrombin Time: 15.3 seconds — ABNORMAL HIGH (ref 11.6–15.2)

## 2014-08-09 LAB — ETHANOL: Alcohol, Ethyl (B): 392 mg/dL (ref <5)

## 2014-08-09 NOTE — ED Notes (Signed)
Bed: WA17 Expected date:  Expected time:  Means of arrival:  Comments: etoh

## 2014-08-09 NOTE — ED Notes (Signed)
Pt pulled his c-collar off.
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
## 2014-08-09 NOTE — Discharge Instructions (Signed)
Ch?n th??ng ??u (Head Injury) Qu v? b? m?t ch?n th??ng ? ??u. Ch?n th??ng khng c v? nghim tr?ng vo th?i ?i?m ny. ?au ??u v nn m?a l ph? bi?n sau ch?n th??ng ??u. Qu v? d? b? th?c d?y v c?n ?au. ?i khi qu v? c?n ? l?i phng c?p c?u m?t th?i gian ?? quan st. ?i khi qu v? c th? c?n nh?p vi?n. Sau nh?ng ch?n th??ng nh? ch?n th??ng c?a qu v?, h?u h?t cc v?n ?? x?y ra trong vng 24 gi? ??u, nh?ng ?nh h??ng ph? c th? x?y ra trong 7 ??n 10 ngy sau ch?n th??ng. ?i?u quan tr?ng l qu v? c?n theo di st tnh tr?ng c?a mnh v lin l?c v?i chuyn gia ch?m [place] s?c kh?e ho?c ?i khm ngay l?p t?c n?u tnh tr?ng c?a qu v? thay ??i. C NH?NG LO?I CH?N TH??NG ??U NO? Ch?n th??ng ??u c th? nh? do m?t va ch?m. M?t s? ch?n th??ng ??u c th? n?ng h?n. Nh?ng ch?n th??ng ??u n?ng h?n bao g?m:  Ch?n th??ng gy chong no (ch?n ??ng).  B?m gi?p no (??ng gi?p). ?i?u ny c ngh?a l c ch?y mu no c th? gy s?ng n?.  N?t x??ng s? (r?n x??ng s?).  Mu ch?y trong no b? ??ng l?i, ?ng c?c, v hnh thnh m?t c?c (t? mu). NGUYN NHN CH?N TH??NG ??U L G? Ch?n th??ng n?ng ? ??u th??ng x?y ra v?i ng??i ng?i trong xe b? tai n?n v khng ?eo dy an ton. Nh?ng nguyn nhn khc d?n ??n ch?n th??ng ??u m?c ?? n?ng bao g?m tai n?n xe ??p ho?c xe my, ch?n th??ng do ch?i th? thao v b? ng. CH?N TH??NG ??U ???C CH?N ?ON NH? TH? NO? Ton b? ti?n s? c?a s? ki?n d?n ??n ch?n th??ng v cc tri?u ch?ng hi?n t?i c?a qu v? s? h?u ch cho vi?c ch?n ?on ch?n th??ng ??u. \Nhi?u khi c?n ph?i ch?p no, ch?ng h?n nh? ch?p CT ho?c MRI, ?? xem m?c ?? th??ng t?n. Thng th??ng c?n ph?i ? l?i b?nh vi?n qua ?m ?? theo di.  KHI NO TI C?N ?I KHM NGAY L?P T?C?  Qu v? c?n ???c tr? gip ngay n?u:  Qu v? b? l l?n ho?c bu?n ng?.  Qu v? c?m th?y kh ch?u trong d? dy (bu?n nn) ho?c lin t?c nn m?a nhi?u.  Qu v? b? chng m?t ho?c ??ng khng v?ng, ?i?u ny tr? nn t? h?n.  Qu v? b? ?au ??u n?ng, lin t?c  khng thuyn gi?m sau khi s? d?ng thu?c. Ch? s? d?ng thu?c khng c?n k ??n ho?c thu?c c?n k ??n ?? gi?m ?au, h? s?t, ho?c gi?m c?m gic kh ch?u theo ch? d?n c?a chuyn gia ch?m [place] s?c kh?e c?a qu v?.  Qu v? khng c? ??ng ???c tay ho?c chn nh? bnh th??ng ho?c khng th? ?i l?i.  Qu v? th?y thay ??i cc ?i?m ?en ? trung tm c?a ph?n c mu c?a m?t (??ng t?).  C d?ch trong ho?c l?n mu ch?y ra kh?i m?i ho?c tai c?a qu v?.  Qu v? khng nhn th?y g. Trong vng 24 gi? sau khi b? ch?n th??ng, qu v? ph?i ? cng v?i m?t ng??i no ? c th? theo di qu v? ?? pht hi?n nh?ng d?u hi?u c?nh bo. Ng??i ny c?n lin l?c v?i d?ch v? c?p c?u ? ??a ph??ng (911 ? M?) n?u qu v? b? co gi?t, qu v? b? b?t t?nh,  ho?c qu v? khng th? t?nh l?i ???c. TI C TH? NG?N NG?A CH?N TH??NG ??U TRONG T??NG LAI NH? TH? NO? Y?u t? quan tr?ng nh?t trong vi?c ng?n ng?a ch?n th??ng n?ng ? ??u l trnh b? tai n?n xe c?. ?? gi?m thi?u kh? n?ng th??ng t?n ? ??u, ?i?u quan tr?ng nh?t l ph?i ?eo dy b?o hi?m khi ?i xe. ??i m? b?o hi?m khi ?i xe my v khi ch?i cc mn th? thao ??i khng (nh? bng ?) c?ng l h?u ch. Ngoi ra, vi?c trnh nh?ng ho?t ??ng nguy hi?m xung quanh nh c?ng s? gip gi?m nguy c? ch?n th??ng ??u.  KHI NO TI C TH? TR? L?I HO?T ??NG V T?P TH? THAO BNH TH??NG? Qu v? c?n ???c chuyn gia ch?m [place] s?c kh?e khm l?i tr??c khi tr? l?i nh?ng ho?t ??ng ny. N?u qu v? c b?t k? tri?u ch?ng no d??i ?y, qu v? khng nn th?c hi?n cc ho?t ??ng ho?c ch?i th? thao ??i khng tr? l?i cho ??n khi ???c 1 tu?n sau khi h?t nh?ng tri?u ch?ng ny.  ?au ??u lin [place] m?t ho?c chng m?t.  Phn tm v thi?u t?p trung.  B? l l?n.  V?n ?? tr nh?Marland Kitchen  Bu?n nn ho?c nn m?a.  M?t m?i ho?c d? m?t m?i.  D? b? kch thch.  Khng ch?u ???c nh sng m?nh ho?c ti?ng ?n l?n.  Lo l?ng ho?c tr?m c?m.  Ng? khng su. ??M B?O QU V?:   Hi?u r cc h??ng d?n ny.  S? theo di tnh tr?ng c?a mnh.  S? yu  c?u tr? gip ngay l?p t?c n?u qu v? c?m th?y khng kh?e ho?c th?y tr?m tr?ng h?n. Document Released: 03/06/2005 Document Revised: 03/11/2013 [facility] Patient Information 2015 [place]. This information is not intended to replace advice given to you by your health care provider. Make sure you discuss any questions you have with your health care provider.  Cai R??u (Alcohol Withdrawal) B?t c? lc no dng thu?c gy c?n tr? cc sinh ho?t trong cu?c s?ng bnh th??ng c?ng ??u l l?m d?ng. ?i?u ny bao g?m nh?ng v?n ?? lin quan ??n gia ?nh v b?n b. S? l? thu?c v? m?t tm l ngy cng b?c l? r khi tm tr b?o r?ng b?n c?n ph?i dng thu?c. Sau ? th??ng l l? thu?c v? m?t th? ch?t, khi c?n ph?i dng thu?c t?ng ln lin t?c ?? ??t ???c c?m gic t??ng t? ho?c "h?ng ph?n". ?i?u ny ???c g?i l nghi?n hay l? thu?c v? m?t ha h?c. Nguy c? m?t ng??i b? nghi?n cao h?n n?u ti?n s? trong gia ?nh c?ng c ng??i b? l? thu?c v? m?t ha h?c. H?i ch?ng cai r??u nh? x?y ra khi ng?ng u?ng r??u ? ng??i ? b? nghi?n r??u ho?c ph? thu?c vo ch?t ha h?c. Khi m?t ng??i no ? ngy cng b?c l? r vi?c dung n?p r??u, b?t c? l?n d?ng u?ng r??u ??t ng?t no ??u c th? gy ra nh?ng tri?u ch?ng kh ch?u v? m?t th?c th?. H?u h?t cc tri?u ch?ng ny l nh?, bao g?m run tay, nh?p tim nhanh, th? nhanh v s?t. ?i khi cc tri?u ch?ng ny c km theo lo u, cc c?n s? hi v c m?ng. C?ng c th? c c?m gic bu?n nn. Gi?c ng? bnh th??ng th??ng b? gin ?o?n b?ng nh?ng giai ?o?n khng th? ng? ???c (m?t ng?). ?i?u ny c th? ko di trong 6 thng. V  c?m gic kh ch?u ny, nhi?u ng??i ch?n ti?p t?c u?ng r??u ?? khng b? c?m gic kh ch?u ny v ?? c? c?m th?y bnh th??ng. H?i ch?ng cai r??u n?ng khi gi?m ho?c khng u?ng r??u ? ng??i ? b? nghi?n r??u ho?c ph? thu?c vo ch?t ha h?c . Kho?ng 5% trong s? nh?ng ng??i nghi?n r??u s? xu?t hi?n nh?ng d?u hi?u cai r??u n?ng khi h? d?ng u?ng r??u. ??ng kinh (co gi?t) c?n l?n l m?t trong  nh?ng d?u hi?u cai r??u n?ng. Nh?ng d?u hi?u khc c?a cai r??u l kch ??ng v l l?n n?ng. C th? km d?u hi?u tin vo nh?ng ?i?u khng c th?t hay nhn th?y nh?ng v?t th?c ra khng c ? ? (hoag t??ng v ?o gic). Th??ng b? thi?u vitamin n?u u?ng r??u trong th?i gian di. Bi?n php ?i?u tr? cho h?u h?t cc tr??ng h?p ny th??ng ph?i nh?p vi?n v theo di st. Ch? c d?ng t?t c? cc ch?t ha h?c m?i c th? gip ch cho v?n ?? nghi?n. ?i?u ny kh th?c hi?n nh?ng c th? c?u ???c m?ng s?ng c?a b?n. Khi dng r??u lin t?c, h?u qu? c kh? n?ng x?y ra th??ng l m?t ?i lng t? tr?ng v s? qu tr?ng b?n thn, c hnh vi b?o l?c, v ch?t. Nghi?n khng th? ch?a kh?i ???c nh?ng c th? ng?n ch?n ???c. ?i?u ny th??ng c?n ph?i c s? gip ?? c?a nh?ng ng??i xung quanh v s? ch?m [place] c?a nh?ng ng??i c chuyn mn. Cc trung tm ?i?u tr? ???c li?t k trong danh b? ?i?n tho?i ?n danh theo Cocaine, Narcotics v Alcoholics. H?u h?t cc b?nh vi?n v phng khm ??u c th? gi?i thi?u b?n ??n m?t trung tm ch?m [place] chuyn khoa. B?n khng c?n ph?i c h?t t?t c? cc tri?u ch?ng kh ch?u c?a cai r??u. Chuyn gia ch?m [place] s?c kh?e c th? cung c?p cc lo?i thu?c ?? gip b?n v??t qua giai ?o?n kh kh?n ny. C? g?ng trnh nh?ng tnh hu?ng, b?n b hay cc lo?i thu?c m lc tr??c c kh? n?ng khi?n cho b?n ti?p t?c u?ng r??u. Hy h?c cch ni khng. Ph?i t?n r?t nhi?u th?i gian ?? chi?n th?ng vi?c nghi?n t?t c? cc lo?i thu?c, k? c? r??u. C th? c nhi?u l?n b?n c?m th?y nh? mu?n u?ng r??u tr? l?i. Sau khi v??t qua ???c s? l? thu?c v? m?t th? ch?t v h?i ch?ng cai r??u, b?n s? gi?m b?t c?m gic thm kht nh?c nh? b?n c?n ph?i u?ng r??u ?? c c?m gic bnh th??ng. Hy g?i chuyn gia ch?m [place] s?c kh?e n?u c?n gip ?? nhi?u h?n. Hy h?c cch ni chuy?n v?i thnh vin trong gia ?nh v b?n b ?? c th? nh?n ???c s? gip ?? t? nh?ng ng??i xung quanh trong su?t giai ?o?n ny. Trung tm AA (vi?t t?t c?a Alcoholics Anonymous) ? gip ?? cho nhi?u  ng??i trong nhi?u n?m. ?? ???c gip ?? nhi?u h?n, hy lin h? v?i AA hay g?i ?i?n tho?i cho chuyn gia ch?m [place] s?c kh?e, ng??i t? v?n hay ng??i ch?m [place]. Al-Anon v [place] l nh?ng t? ch?c gip ?? cho nh?ng ng??i b?n v cc thnh vin trong gia ?nh c?a ng??i nghi?n r??u. Nh?ng ng??i yu th??ng v ch?m [place] cho ng??i nghi?n r??u c?ng th??ng c?n ??n s? gip ??. ?? bi?t thng tin v? cc t? ch?c ny, hy h?i t?ng ?i ?i?n tho?i hay g?i cho trung tm ?i?u  tr? cho nh?ng ng??i nghi?n r??u ? ??a ph??ng. HY NGAY L?P T?C ?I KHM N?U:  B?n b? co gi?t.  B?n b? s?t.  B?n ? t?ng b? i nhi?u hay i ra mu. Mu i ra c th? ?? t??i hay trng gi?ng nh? b c ph ?en.  B?n ?i tiu ra mu. Phn c th? mu ?? t??i hay mu ?en nh? h?c n, c mi r?t hi.  B?n b? nh?c chng m?t ho?c ng?t. Khng ???c li xe khi b?n c c?m gic ny. Hy nh? m?t ng??i no ? li xe gip hay g?i ?i?n tho?i s? 911 nh? gip ??.  B?n b? kch ??ng hay l l?n.  B?n b? lo l?ng khng ki?m sot ???c.  B?n b?t ??u nhn th?y nh?ng th? m th?t ra khng c ? ? (?o gic). Chuyn gia ch?m [place] s?c kh?e ? xc ??nh r?ng b?n hon ton hi?u b?nh tr?ng c?a mnh, v tr?ng thi tinh th?n c?a b?n tr? l?i bnh th??ng. B?n hi?u r?ng b?n ? ???c ?i?u tr? ?? cai r??u, ? ??ng  khng u?ng r??u trong t nh?t 1 ngy, s? khng li xe hay v?n hnh my mc khc trong 24 gi?, v ? c c? h?i ?? h?i b?t k? cu h?i no v? b?nh tr?ng c?a mnh. Document Released: 03/06/2005 Document Revised: 11/06/2012 [facility] Patient Information 2015 [place]. This information is not intended to replace advice given to you by your health care provider. Make sure you discuss any questions you have with your health care provider.  Emergency Department Resource Guide 1) Find a Doctor and Pay Out of Pocket Although you won't have to find out who is covered by your insurance plan, it is a good idea to ask around and get recommendations. You will then need to call the office and  see if the doctor you have chosen will accept you as a new patient and what types of options they offer for patients who are self-pay. Some doctors offer discounts or will set up payment plans for their patients who do not have insurance, but you will need to ask so you aren't surprised when you get to your appointment.  2) Contact Your Local Health Department Not all health departments have doctors that can see patients for sick visits, but many do, so it is worth a call to see if yours does. If you don't know where your local health department is, you can check in your phone book. The CDC also has a tool to help you locate your state's health department, and many state websites also have listings of all of their local health departments.  3) Find a [place] [facility] If your illness is not likely to be very severe or complicated, you may want to try a walk in clinic. These are popping up all over the country in pharmacies, drugstores, and shopping centers. They're usually staffed by nurse practitioners or physician assistants that have been trained to treat common illnesses and complaints. They're usually fairly quick and inexpensive. However, if you have serious medical issues or chronic medical problems, these are probably not your best option.  No Primary Care Doctor: - Call Health Connect at  [phone] - they can help you locate a primary care doctor that  accepts your insurance, provides certain services, etc. - Physician Referral Service- [phone]  Chronic Pain Problems: Organization         Address  Phone   Notes  [facility]  [phone]  [phone] Patients need to be referred by their primary care doctor.   Medication Assistance: Organization         Address  Phone   Notes  [facility] Medication [facility] [address] [place], [place] [address] [phone] --Must be a resident of [facility] -- Must have NO insurance coverage whatsoever  (no Medicaid/ Medicare, etc.) -- The pt. MUST have a primary care doctor that directs their care regularly and follows them in the community   MedAssist  [phone]   [employer]  [phone]    Agencies that provide inexpensive medical care: Organization         Address  Phone   Notes  [place]  [phone]   [facility]    [phone]   [facility] [place], [place] [address] [phone]   [place] [address], [place] [phone]   Planned Parenthood    [phone]   [facility]    [phone]   [place] and [address], [place] Phone:  [phone], Fax:  [phone] Hours of Operation:  9 am - 6 pm, M-F.  Also accepts Medicaid/Medicare and self-pay.  [facility] for [place], [address], [place] Phone: [phone], Fax: [phone]. Hours of Operation:  8:30 am - 5:30 pm, M-F.  Also accepts Medicaid and self-pay.  [facility] Adult [facility] Services [place] [address] [place] Phone: [phone]   [place], [place], [place] [phone], Ext. [phone] Mondays & Thursdays: 7-9 AM.  First 15 patients are seen on a first come, first serve basis.    [place] Providers:  Organization         Address  Phone   Notes  [facility] [address], [place] [phone] Also accepts self-pay patients.  [facility] [address], [place]  [phone]   [place], [address], [place] [phone]   [facility] [address], [place] [phone]   Lucianne Lei [address], [place]   [phone] Only accepts [place] Access [place] patients after they have their name applied to their  card.   Self-Pay (no insurance) in [facility]:  Organization         Address  Phone   Notes  Sickle Cell Patients, [facility] [place] [phone]   [facility] Urgent Care [place] [phone]   [facility] [place]  [place], [place], [place] [phone]   [facility]/Dr. Osei-Bonsu  [address], [place] or [address], [place] [phone] Phone number for both [place] and [place] locations is the same.  [facility] [address], [place] [phone]   [facility] [address], [place] or [address] [phone]   [facility] [address], [place] [phone], phone; [phone], fax Sees patients 1st and 3rd Saturday of every month.  Must not qualify for public or private insurance (i.e. Medicaid, Medicare, [place] Health Choice, Veterans' Benefits)  Household income should be no more than 200% of the poverty level The clinic cannot treat you if you are pregnant or think you are pregnant  Sexually transmitted diseases are not treated at the clinic.    Dental Care: Organization         Address  Phone  Notes  [facility] Department of [facility] [place] [phone] Accepts children up to age 18 who are enrolled in [place] or [place]; pregnant women with a Medicaid card; and children who have applied for Medicaid or [place] Health Choice, but were declined, whose parents can pay a reduced fee at time of service.  [facility] Department of [facility]  [address], [place] [phone] Accepts children up to age 74 who are enrolled in [place] or [place]; pregnant women with a Medicaid card; and children who have applied for Medicaid or [place] Health  Choice, but were declined, whose parents can pay a reduced fee at time of service.  [place] Adult Dental Access PROGRAM  [place] [phone] Patients are seen by appointment only. Walk-ins are not accepted. [place] will see patients 48 years of age and older. Monday - Tuesday (8am-5pm) Most Wednesdays (8:30-5pm) $30 per visit, cash only  [facility] Adult Dental Access PROGRAM  [address], [facility] [phone] Patients are seen by appointment only. Walk-ins are not accepted. [place] will see patients 31 years of age and older. One Wednesday Evening (Monthly: Volunteer Based).  $30 per visit, cash only  [place]  [phone] for adults; Children under age 20, call Graduate Pediatric Dentistry at [phone]. Children aged 36-14, please call [phone] to request a pediatric application.  Dental services are provided in all areas of dental care including fillings, crowns and bridges, complete and partial dentures, implants, gum treatment, root canals, and extractions. Preventive care is also provided. Treatment is provided to both adults and children. Patients are selected via a lottery and there is often a waiting list.   [facility] [address] [place]  [phone] [email or web address]   [facility] [address] [place], [place] [phone], Ext. [phone] Second and Fourth Thursday of each month, opens at 6:30 AM; Clinic ends at 9 AM.  Patients are seen on a first-come first-served basis, and a limited number are seen during each clinic.   [facility]  [address] [place] D[place], [place] [phone]   Eligibility Requirements You must have lived in [place], [place], or [place] counties for at least the last three months.   You cannot be eligible for state or federal sponsored [employer], including [employer], [place], or [employer].   You  generally cannot be eligible for healthcare insurance through your employer.    How to apply: Eligibility screenings are held every Tuesday and Wednesday afternoon from 1:00 pm until 4:00 pm. You do not need an appointment for the interview!  [facility] [address] [place], [place]   [place]  [place] Department  [place]  [phone]    Behavioral Health Resources in the Community: Intensive Outpatient Programs Organization         Address  Phone  Notes  [place]. [address] [place],  [place] [phone]   [facility] [address], [place], [place]   ADS: Alcohol & Drug Svcs [address] [place], [place]   [place] [address],  [place], [place] or [phone]   Substance Abuse Resources Organization         Address  Phone  Notes  Alcohol and Drug Services  [phone]   [place]  [phone]   [facility] [place]   Chinita Pester  [phone]   Residential & Outpatient Substance Abuse Program  [phone]   Psychological Services Organization         Address  Phone  Notes  [facility] [place] [facility]  [place]  [phone]   [place] [address], [place] or [phone]    Mobile Crisis Teams Organization         Address  Phone  Notes  [facility], Mobile Crisis Care Unit  [phone]   [facility]  [address] [place], [place]   [place] Levels [address], [place] [phone]    Self-Help/Support Groups Organization         Address  Phone             Notes  [place]. of [place] - variety of support groups  [place] Call for  more information  Narcotics Anonymous (NA), Caring Services [address], [employer] [place]  2 meetings at this location   Special educational needs teacher         Address  Phone  Notes  ASAP Residential Treatment [place],    [place]  [phone]   [facility]  [address] [place] [phone], [place], [place]   [place], [place] [phone] Admissions: 8am-3pm M-F  Incentives Substance [place] [address],    [place], [place] [phone]   [facility] [address] [place], [place], [place]   [facility] [address]  [place], [place]   [facility]ight Programs - Intensive Outpatient [address] Kristeen [address], [place], [place]   [facility] [address]  [place], [place] [phone] or [phone]   Residential Treatment Services (RTS) [address], [place], [place] Accepts Medicaid  [facility] [place] [address],  [place] [phone] Substance Abuse/Addiction Treatment   [facility] Organization         Address  Phone  Notes  [facility]  [phone]   Domenic Schwab, PhD [address] [place], [place]   [phone] or [phone]   [facility] [place], [place] [phone]   [facility] [address] [place], [place] [phone] Insurance/Medicaid/sponsorship through [facility] and Families [address]                                    [place], [place] [phone] [place] [address], [place] [phone]    Dr. Adele [place]ilder  [phone]   [facility] Dept. 1) [address] [place] 2) [place]  [address], [place] 3)  [place], [place] [phone]  [phone]   [facility] Child Abuse Hotline [phone] or [phone] (After Hours)

## 2014-08-09 NOTE — ED Notes (Signed)
Pt called his Father, who is at work, states he will pick up pt at 12:30

## 2014-08-09 NOTE — ED Provider Notes (Signed)
CSN: [id number]     Arrival date & time 08/09/14  1904 History   First MD Initiated Contact with Patient 08/09/14 1917     Chief Complaint  Patient presents with  . Alcohol Intoxication  . Fall     (Consider location/radiation/quality/duration/timing/severity/associated sxs/prior Treatment) HPI Patient is brought in by EMS for a fall on the street. The patient is highly intoxicated and was apparently witnessed to fall and hit his head on the pavement. It was unclear if the patient had loss of consciousness because he was so intoxicated. Patient is too intoxicated this time to give useful history. Past Medical History  Diagnosis Date  . Back pain, chronic   . Hypertension   . AAA (abdominal aortic aneurysm) 10/2010    4.1cm on CT scan  . Myocardial infarction     "think so; when I was young; used to go to heart dr"  . Pneumonia   . Chronic bronchitis 11/08/2011    "get it q month"  . Shortness of breath 11/08/2011    "lying down"  . Mental disorder 11/08/2011    "I get crazy; I take medicine"  . Depression   . Anxiety   . Bipolar 1 disorder   . Schizo-affective schizophrenia   . GERD (gastroesophageal reflux disease)   . History of stomach ulcers 11/08/2011  . Hepatitis 11/08/2011    "not sure which one"  . Daily headache 11/08/2011  . Stroke 11/08/2011    points to right chest  . 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 Burning with urination 11/08/2011  . Arthritis 11/08/2011    "feet and knees"   Past Surgical History  Procedure Laterality Date  . Cholecystectomy  2008  . Eus  11/14/2011    Procedure: FULL UPPER ENDOSCOPIC ULTRASOUND (EUS) RADIAL;  Surgeon: Willis ModenaWilliam Outlaw, MD;  Location: [facility];  Service: Endoscopy;  Laterality: N/A;  [facility]   Family History  Problem Relation Age of Onset  . Hypertension Mother    History  Substance Use Topics  . Smoking status: Current Every Day Smoker -- 0.50 packs/day for 29 years    Types: Cigarettes  .  Smokeless tobacco: Never Used  . Alcohol Use: 3.0 oz/week    5 Cans of beer per week     Comment: 11/08/2011 "4-5 bottles beer/wk"    Review of Systems At this time, unable to obtain review of systems due to acute intoxication.   Allergies  Review of patient's allergies indicates no known allergies.  Home Medications   Prior to Admission medications   Medication Sig Start Date End Date Taking? Authorizing Provider  HYDROcodone-acetaminophen (NORCO/VICODIN) 5-325 MG per tablet Take 1 tablet by mouth every 6 (six) hours as needed for moderate pain. 05/11/14   Charlestine Nighthristopher Lawyer, PA-C  ibuprofen (ADVIL,MOTRIN) 800 MG tablet Take 1 tablet (800 mg total) by mouth every 8 (eight) hours as needed. 05/11/14   Christopher Lawyer, PA-C   BP 133/86 mmHg  Pulse 91  Temp(Src) 98.1 F (36.7 C) (Oral)  Resp 24  SpO2 95% Physical Exam  Constitutional: He appears well-developed and well-nourished.  The patient smells very strongly of alcohol. He is disheveled with poor grooming. He is sleeping. No respiratory distress.  HENT:  Mouth/Throat: Oropharynx is clear and moist.  Minor laceration to the right forehead at the scalp line. No hematoma. Very poor dentition with no evident acute oral or jaw injury.  Eyes: EOM are normal. Pupils are equal, round, and  reactive to light.  Conjunctiva are very inflamed and the patient has slightly roving eye motions.  Neck: No tracheal deviation present.  Cardiovascular: Normal rate, regular rhythm, normal heart sounds and intact distal pulses.   Pulmonary/Chest: Effort normal and breath sounds normal. No stridor. He exhibits no tenderness.  Abdominal: Soft. Bowel sounds are normal. He exhibits no distension. There is no tenderness.  Musculoskeletal: Normal range of motion. He exhibits no edema.  Neurological: He has normal strength. GCS eye subscore is 4. GCS verbal subscore is 5. GCS motor subscore is 6.  Patient is extremely intoxicated. He will open his eyes  and make a vague answer. He doesn't follow commands to perform adequate neuro examination.  Skin: Skin is warm, dry and intact.    ED Course  Procedures (including critical care time) Labs Review Labs Reviewed  COMPREHENSIVE METABOLIC PANEL - Abnormal; Notable for the following:    Sodium 147 (*)    Potassium 3.4 (*)    Calcium 8.7 (*)    ALT 12 (*)    All other components within normal limits  ETHANOL - Abnormal; Notable for the following:    Alcohol, Ethyl (B) 392 (*)    All other components within normal limits  CBC WITH DIFFERENTIAL/PLATELET - Abnormal; Notable for the following:    Hemoglobin 17.3 (*)    All other components within normal limits  PROTIME-INR - Abnormal; Notable for the following:    Prothrombin Time 15.3 (*)    All other components within normal limits  URINALYSIS, ROUTINE W REFLEX MICROSCOPIC  URINE RAPID DRUG SCREEN (HOSP PERFORMED)    Imaging Review Ct Head Wo Contrast  08/09/2014   CLINICAL DATA:  Patient status post fall.  EXAM: CT HEAD WITHOUT CONTRAST  CT CERVICAL SPINE WITHOUT CONTRAST  TECHNIQUE: Multidetector CT imaging of the head and cervical spine was performed following the standard protocol without intravenous contrast. Multiplanar CT image reconstructions of the cervical spine were also generated.  COMPARISON:  Brain CT 08/09/2014  FINDINGS: CT HEAD FINDINGS  Ventricles and sulci appropriate for patient's age. No evidence for acute cortically based infarct, intracranial hemorrhage, mass lesion or mass-effect. Bilateral areas of deep white matter hypodensity are stable from prior exam. Basal ganglia calcifications. Calvarium intact. Paranasal sinus mucosal disease. Mastoid air cells unremarkable.  CT CERVICAL SPINE FINDINGS  Straightening of the normal cervical lordosis. Preservation of the vertebral body and intervertebral disc space heights. Craniocervical junction is unremarkable. Prevertebral soft tissues are unremarkable. No evidence for acute  cervical spine fracture. Multiple disc bulges most pronounced C3-4 and C4-5.  IMPRESSION: Focal posterior disc bulges most pronounced C3-4 and C4-5.  No acute intracranial process.  No acute cervical spine fracture.  Chronic and deep white matter and globus pallidus ischemic changes.   Electronically Signed   By: Annia Belt M.D.   On: 08/09/2014 20:46   Ct Cervical Spine Wo Contrast  08/09/2014   CLINICAL DATA:  Patient status post fall.  EXAM: CT HEAD WITHOUT CONTRAST  CT CERVICAL SPINE WITHOUT CONTRAST  TECHNIQUE: Multidetector CT imaging of the head and cervical spine was performed following the standard protocol without intravenous contrast. Multiplanar CT image reconstructions of the cervical spine were also generated.  COMPARISON:  Brain CT 08/09/2014  FINDINGS: CT HEAD FINDINGS  Ventricles and sulci appropriate for patient's age. No evidence for acute cortically based infarct, intracranial hemorrhage, mass lesion or mass-effect. Bilateral areas of deep white matter hypodensity are stable from prior exam. Basal ganglia calcifications. Calvarium intact.  Paranasal sinus mucosal disease. Mastoid air cells unremarkable.  CT CERVICAL SPINE FINDINGS  Straightening of the normal cervical lordosis. Preservation of the vertebral body and intervertebral disc space heights. Craniocervical junction is unremarkable. Prevertebral soft tissues are unremarkable. No evidence for acute cervical spine fracture. Multiple disc bulges most pronounced C3-4 and C4-5.  IMPRESSION: Focal posterior disc bulges most pronounced C3-4 and C4-5.  No acute intracranial process.  No acute cervical spine fracture.  Chronic and deep white matter and globus pallidus ischemic changes.   Electronically Signed   By: Annia Belt M.D.   On: 08/09/2014 20:46     EKG Interpretation None     Recheck at 2315. Patient is ambulating around the emergency department. He is having a conversation on the telephone with his father to come pick him up.  He is alert and dressed. He is sitting on a chair with no signs of acute distress or confusion. MDM   Final diagnoses:  Acute alcohol intoxication, with unspecified complication  Alcohol dependence with unspecified alcohol-induced disorder  Minor head injury, initial encounter   Patient arrives with acute alcohol intoxication and chronic alcoholism. CT head does not show any evidence of intracranial injury. The patient has no neurologic deficit. After a period of observation he is alert and cognitively intact. He'll be discharged in the care of his father.    Arby Barrette, MD 08/09/14 [phone]

## 2015-05-02 ENCOUNTER — Encounter (HOSPITAL_COMMUNITY): Payer: Self-pay | Admitting: Emergency Medicine

## 2015-05-02 ENCOUNTER — Emergency Department (HOSPITAL_COMMUNITY)
Admission: EM | Admit: 2015-05-02 | Discharge: 2015-05-03 | Disposition: A | Discharge status: Home / Self Care | Payer: Medicaid Other | Attending: Emergency Medicine | Admitting: Emergency Medicine

## 2015-05-02 DIAGNOSIS — Z8673 Personal history of transient ischemic attack (TIA), and cerebral infarction without residual deficits: Secondary | ICD-10-CM | POA: Insufficient documentation

## 2015-05-02 DIAGNOSIS — Z8701 Personal history of pneumonia (recurrent): Secondary | ICD-10-CM | POA: Insufficient documentation

## 2015-05-02 DIAGNOSIS — I252 Old myocardial infarction: Secondary | ICD-10-CM | POA: Insufficient documentation

## 2015-05-02 DIAGNOSIS — F10120 Alcohol abuse with intoxication, uncomplicated: Secondary | ICD-10-CM | POA: Insufficient documentation

## 2015-05-02 DIAGNOSIS — G8929 Other chronic pain: Secondary | ICD-10-CM | POA: Insufficient documentation

## 2015-05-02 DIAGNOSIS — F17210 Nicotine dependence, cigarettes, uncomplicated: Secondary | ICD-10-CM | POA: Insufficient documentation

## 2015-05-02 DIAGNOSIS — F10920 Alcohol use, unspecified with intoxication, uncomplicated: Secondary | ICD-10-CM

## 2015-05-02 DIAGNOSIS — I10 Essential (primary) hypertension: Secondary | ICD-10-CM | POA: Insufficient documentation

## 2015-05-02 DIAGNOSIS — Z8709 Personal history of other diseases of the respiratory system: Secondary | ICD-10-CM | POA: Insufficient documentation

## 2015-05-02 DIAGNOSIS — M158 Other polyosteoarthritis: Secondary | ICD-10-CM | POA: Insufficient documentation

## 2015-05-02 DIAGNOSIS — Z8719 Personal history of other diseases of the digestive system: Secondary | ICD-10-CM | POA: Insufficient documentation

## 2015-05-02 MED ORDER — AMMONIA AROMATIC IN INHA
RESPIRATORY_TRACT | Status: AC
  Filled 2015-05-02: qty 10

## 2015-05-02 NOTE — ED Provider Notes (Signed)
CSN: [id number]     Arrival date & time 05/02/15  2024 History   First MD Initiated Contact with Patient 05/02/15 2030     Chief Complaint  Patient presents with  . Alcohol Intoxication     (Consider location/radiation/quality/duration/timing/severity/associated sxs/prior Treatment) Patient is a 45 y.o. male presenting with intoxication. The history is provided by the EMS personnel.  Alcohol Intoxication This is a recurrent problem. The current episode started today. The problem has been unchanged. Nothing aggravates the symptoms. He has tried nothing for the symptoms.    Past Medical History  Diagnosis Date  . Back pain, chronic   . Hypertension   . AAA (abdominal aortic aneurysm) (HCC) 10/2010    4.1cm on CT scan  . Myocardial infarction [facility])     "think so; when I was young; used to go to heart dr"  . Pneumonia   . Chronic bronchitis (HCC) 11/08/2011    "get it q month"  . Shortness of breath 11/08/2011    "lying down"  . Mental disorder 11/08/2011    "I get crazy; I take medicine"  . Depression   . Anxiety   . Bipolar 1 disorder (HCC)   . Schizo-affective schizophrenia (HCC)   . GERD (gastroesophageal reflux disease)   . History of stomach ulcers 11/08/2011  . Hepatitis 11/08/2011    "not sure which one"  . Daily headache 11/08/2011  . Stroke (HCC) 11/08/2011    points to right chest  . Seizures (HCC) 11/08/2011    "jerking kind"  . Burning with urination 11/08/2011  . Arthritis 11/08/2011    "feet and knees"   Past Surgical History  Procedure Laterality Date  . Cholecystectomy  2008  . Eus  11/14/2011    Procedure: FULL UPPER ENDOSCOPIC ULTRASOUND (EUS) RADIAL;  Surgeon: Willis Modena, MD;  Location: [facility];  Service: Endoscopy;  Laterality: N/A;  to be carelinked from [facility]   Family History  Problem Relation Age of Onset  . Hypertension Mother    Social History  Substance Use Topics  . Smoking status: Current Every Day Smoker -- 0.50 packs/day for 29 years   Types: Cigarettes  . Smokeless tobacco: Never Used  . Alcohol Use: 3.0 oz/week    5 Cans of beer per week     Comment: 11/08/2011 "4-5 bottles beer/wk"    Review of Systems  Unable to perform ROS: Mental status change      Allergies  Review of patient's allergies indicates no known allergies.  Home Medications   Prior to Admission medications   Medication Sig Start Date End Date Taking? Authorizing Provider  HYDROcodone-acetaminophen (NORCO/VICODIN) 5-325 MG per tablet Take 1 tablet by mouth every 6 (six) hours as needed for moderate pain. 05/11/14   Charlestine Night, PA-C  ibuprofen (ADVIL,MOTRIN) 800 MG tablet Take 1 tablet (800 mg total) by mouth every 8 (eight) hours as needed. 05/11/14   Christopher Lawyer, PA-C   BP 124/81 mmHg  Pulse 105  Temp(Src) 98.4 F (36.9 C) (Oral)  Resp 23  SpO2 94% Physical Exam  Constitutional: He appears well-nourished. No distress.  Obvious intoxication. Smells of alcohol. Arousable to verbal stimuli, but does not follow commands.  HENT:  Head: Normocephalic and atraumatic.  Right Ear: External ear normal.  Left Ear: External ear normal.  Eyes: Pupils are equal, round, and reactive to light. Right eye exhibits no discharge. Left eye exhibits no discharge. No scleral icterus.  Neck: Normal range of motion. Neck supple.  Cardiovascular: Normal rate.  Exam reveals no gallop and no friction rub.   No murmur heard. Pulmonary/Chest: Effort normal and breath sounds normal. No stridor. No respiratory distress. He has no wheezes. He has no rales. He exhibits no tenderness.  Abdominal: Soft. He exhibits no distension and no mass. There is no tenderness. There is no rebound and no guarding.  Musculoskeletal: He exhibits no edema or tenderness.  Skin: Skin is warm and dry. No rash noted. He is not diaphoretic. No erythema.    ED Course  Procedures (including critical care time) Labs Review Labs Reviewed - No data to display  Imaging Review No  results found. I have personally reviewed and evaluated these images and lab results as part of my medical decision-making.   EKG Interpretation None      MDM   45 year old male presents for alcohol intoxication. He is known to [employer] for frequent alcohol abuse. Patient was found at a gas station. On arrival patient is afebrile with stable vital signs. He is intoxicated. No evidence of trauma noted on exam. Patient will be allowed to metabolize to freedom.  0145: Patient monitored for 5 hours without complication. Patient more arousable and metabolizing appropriately. No focal deficits noted on exam. We will contact family or friends to pick the patient up. He is safe for discharge with strict return precautions.  He was seen in conjunction with Dr. Rubin Payor   Final diagnoses:  Alcohol intoxication, uncomplicated [facility])        Drema Pry, MD 05/03/15 0145  Benjiman Core, MD 05/03/15 1459

## 2015-05-02 NOTE — ED Notes (Signed)
Pt in EMS, found passed out at gas station for unknown amnt of time. Pt hard to arouse, CBG 140. [employer] stated this is a regular for them with frequent ETOH abuse.

## 2015-05-03 NOTE — Discharge Instructions (Signed)
° °  Ph? Thu?c vo Ha Ch?t (Chemical Dependency) Ph? thu?c vo ha ch?t l ch?ng nghi?n ma ty ho?c r??u. N ???c ??c tr?ng b?i hnh vi tm ki?m v s? d?ng ma ty v r??u l?p ?i l?p l?i b?t ch?p h?u qu? c h?i cho s?c kh?e v an ton c?a b?n thn v ng??i khc.  CC Y?U T? NGUY C? C nh?ng tnh hu?ng ho?c hnh vi nh?t ??nh lm t?ng nguy c? ph? thu?c vo ha ch?t c?a m?t ng??i. Cc tnh hu?ng ny bao g?m:  Ti?n s? gia ?nh ph? thu?c vo ha ch?t.  Ti?n s? c cc v?n ?? s?c kh?e tm th?n, bao g?m c? tr?m c?m v lo u.  M?t mi tr??ng gia ?nh, n?i m ma ty v r??u c th? d? dng ???c s? d?ng.  S? d?ng ma ty ho?c r??u ? ?? tu?i tr?. TRI?U CH?NG Cc tri?u ch?ng sau ?y c th? cho th?y s? ph? thu?c vo ha ch?t:  Khng c kh? n?ng h?n ch? s? d?ng ma ty ho?c r??u.  Hi?n t??ng bu?n nn, ra m? hi, run r?y v lo l?ng x?y ra khi khng s? d?ng r??u ho?c ma ty.  Gia t?ng l??ng ma ty ho?c r??u c?n ?? ph thu?c ho?c say r??u. Nh?ng ng??i c nh?ng tri?u ch?ng ny c th? ?nh gi vi?c s? d?ng ma ty v r??u c?a h? b?ng cch t? h?i cc cu h?i sau:  B?n b ho?c gia ?nh c t?ng ni v?i b?n r?ng h? lo l?ng v? vi?c b?n s? d?ng r??u hay ma ty khng?  B?n b v gia ?nh c bao gi? ni cho b?n bi?t v? nh?ng ?i?u b?n ? lm trong khi u?ng r??u ho?c s? d?ng ma ty m b?n khng nh? khng?  B?n c ni d?i v? vi?c s? d?ng r??u ho?c ma ty ho?c v? s? l??ng m b?n s? d?ng khng?  B?n c g?p kh kh?n trong vi?c hon thnh nhi?m v? hng ngy tr? khi b?n s? d?ng r??u hay ma ty khng?  M?c ?? hi?u su?t cng vi?c ho?c k?t qu? h?c t?p c?a b?n c gi?m st v vi?c b?n s? d?ng ma ty ho?c r??u khng?  B?n c b? ?m do s? d?ng ma ty ho?c r??u, nh?ng v?n ti?p t?c s? d?ng khng?  B?n c c?m th?y khng tho?i mi trong cc tnh hu?ng x h?i, tr? khi b?n s? d?ng r??u hay ma ty khng?  B?n c s? d?ng ma ty ho?c r??u ?? gip qun ?i m?i v?n ?? khng? N?u tr? l?i c cho b?t k? cu h?i no trong s? ny c th? cho  th?y s? ph? thu?c vo ha ch?t. B?n c?n ?nh gi c?a chuyn gia.   Thng tin ny khng nh?m m?c ?ch thay th? cho l?i khuyn m chuyn gia ch?m [place] s?c kh?e ni v?i qu v?. Hy b?o ??m qu v? ph?i th?o lu?n b?t k? v?n ?? g m qu v? c v?i chuyn gia ch?m [place] s?c kh?e c?a qu v?.   Document Released: 03/06/2005 Document Revised: 11/06/2012 Elsevier Interactive Patient Education [employer]

## 2015-05-03 NOTE — ED Notes (Signed)
EDP at bedside  

## 2015-05-04 ENCOUNTER — Encounter (HOSPITAL_COMMUNITY): Payer: Self-pay | Admitting: Emergency Medicine

## 2015-05-04 ENCOUNTER — Emergency Department (HOSPITAL_COMMUNITY)
Admission: EM | Admit: 2015-05-04 | Discharge: 2015-05-05 | Disposition: A | Discharge status: Home / Self Care | Payer: Medicaid Other | Attending: Emergency Medicine | Admitting: Emergency Medicine

## 2015-05-04 DIAGNOSIS — F17210 Nicotine dependence, cigarettes, uncomplicated: Secondary | ICD-10-CM | POA: Insufficient documentation

## 2015-05-04 DIAGNOSIS — I252 Old myocardial infarction: Secondary | ICD-10-CM | POA: Insufficient documentation

## 2015-05-04 DIAGNOSIS — M545 Low back pain, unspecified: Secondary | ICD-10-CM

## 2015-05-04 DIAGNOSIS — I10 Essential (primary) hypertension: Secondary | ICD-10-CM | POA: Insufficient documentation

## 2015-05-04 DIAGNOSIS — Z8673 Personal history of transient ischemic attack (TIA), and cerebral infarction without residual deficits: Secondary | ICD-10-CM | POA: Insufficient documentation

## 2015-05-04 DIAGNOSIS — Z8619 Personal history of other infectious and parasitic diseases: Secondary | ICD-10-CM | POA: Insufficient documentation

## 2015-05-04 DIAGNOSIS — Z8719 Personal history of other diseases of the digestive system: Secondary | ICD-10-CM | POA: Insufficient documentation

## 2015-05-04 DIAGNOSIS — R519 Headache, unspecified: Secondary | ICD-10-CM

## 2015-05-04 DIAGNOSIS — G8929 Other chronic pain: Secondary | ICD-10-CM | POA: Insufficient documentation

## 2015-05-04 DIAGNOSIS — Z8659 Personal history of other mental and behavioral disorders: Secondary | ICD-10-CM | POA: Insufficient documentation

## 2015-05-04 DIAGNOSIS — Z8701 Personal history of pneumonia (recurrent): Secondary | ICD-10-CM | POA: Insufficient documentation

## 2015-05-04 DIAGNOSIS — R51 Headache: Secondary | ICD-10-CM | POA: Insufficient documentation

## 2015-05-04 NOTE — ED Provider Notes (Signed)
CSN: [id number]     Arrival date & time 05/04/15  1806 History  By signing my name below, I, Dustin Hansen, attest that this documentation has been prepared under the direction and in the presence of Gilda Crease, MD . Electronically Signed: Freida Hansen, Scribe. 05/05/2015. 12:08 AM.    Chief Complaint  Patient presents with  . Headache     The history is provided by the patient. No language interpreter was used.     HPI Comments:  Dustin Hansen is a 45 y.o. male brought in by ambulance, who presents to the Emergency Department with multiple complaints of pain x 1 day. He complains of HA, back pain and BLE pain. He denies fall/recent injury. No alleviating factors noted. No CP, SOB, fever, chills or numbness/weakness.   Pt was seen in the ED on 05/03/15 for alcohol intoxication. He admits to drinking 1 beer today.   Past Medical History  Diagnosis Date  . Back pain, chronic   . Hypertension   . AAA (abdominal aortic aneurysm) (HCC) 10/2010    4.1cm on CT scan  . Myocardial infarction [facility])     "think so; when I was young; used to go to heart dr"  . Pneumonia   . Chronic bronchitis (HCC) 11/08/2011    "get it q month"  . Shortness of breath 11/08/2011    "lying down"  . Mental disorder 11/08/2011    "I get crazy; I take medicine"  . Depression   . Anxiety   . Bipolar 1 disorder (HCC)   . Schizo-affective schizophrenia (HCC)   . GERD (gastroesophageal reflux disease)   . History of stomach ulcers 11/08/2011  . Hepatitis 11/08/2011    "not sure which one"  . Daily headache 11/08/2011  . Stroke (HCC) 11/08/2011    points to right chest  . Seizures (HCC) 11/08/2011    "jerking kind"  . Burning with urination 11/08/2011  . Arthritis 11/08/2011    "feet and knees"   Past Surgical History  Procedure Laterality Date  . Cholecystectomy  2008  . Eus  11/14/2011    Procedure: FULL UPPER ENDOSCOPIC ULTRASOUND (EUS) RADIAL;  Surgeon: Willis Modena, MD;  Location: [facility];   Service: Endoscopy;  Laterality: N/A;  to be [facility]linked from [facility]   Family History  Problem Relation Age of Onset  . Hypertension Mother    Social History  Substance Use Topics  . Smoking status: Current Every Day Smoker -- 0.50 packs/day for 29 years    Types: Cigarettes  . Smokeless tobacco: Never Used  . Alcohol Use: 3.0 oz/week    5 Cans of beer per week     Comment: 11/08/2011 "4-5 bottles beer/wk"    Review of Systems  Respiratory: Negative for shortness of breath.   Cardiovascular: Negative for chest pain.  Musculoskeletal: Positive for myalgias (BLE) and back pain.  Neurological: Positive for headaches. Negative for syncope, weakness and numbness.  All other systems reviewed and are negative.   Allergies  Review of patient's allergies indicates no known allergies.  Home Medications   Prior to Admission medications   Medication Sig Start Date End Date Taking? Authorizing Provider  HYDROcodone-acetaminophen (NORCO/VICODIN) 5-325 MG per tablet Take 1 tablet by mouth every 6 (six) hours as needed for moderate pain. Patient not taking: Reported on 05/05/2015 05/11/14   Charlestine Night, PA-C  ibuprofen (ADVIL,MOTRIN) 800 MG tablet Take 1 tablet (800 mg total) by mouth every 8 (eight) hours as needed. Patient not taking: Reported  on 05/05/2015 05/11/14   Charlestine Night, PA-C   BP 147/98 mmHg  Pulse 68  Temp(Src) 98.7 F (37.1 C) (Oral)  Resp 20  SpO2 93% Physical Exam  Constitutional: He is oriented to person, place, and time. He appears well-developed and well-nourished. No distress.  HENT:  Head: Normocephalic and atraumatic.  Right Ear: Hearing normal.  Left Ear: Hearing normal.  Nose: Nose normal.  Mouth/Throat: Oropharynx is clear and moist and mucous membranes are normal.  Eyes: Conjunctivae and EOM are normal. Pupils are equal, round, and reactive to light.  Neck: Normal range of motion. Neck supple.  Cardiovascular: Regular rhythm, S1 normal and S2 normal.   Exam reveals no gallop and no friction rub.   No murmur heard. Pulmonary/Chest: Effort normal and breath sounds normal. No respiratory distress. He exhibits no tenderness.  Abdominal: Soft. Normal appearance and bowel sounds are normal. There is no hepatosplenomegaly. There is no tenderness. There is no rebound, no guarding, no tenderness at McBurney's point and negative Murphy's sign. No hernia.  Musculoskeletal: Normal range of motion. He exhibits no edema or tenderness.  Bilateral paraspinal tenderness nml strength and sensation BLE   Neurological: He is alert and oriented to person, place, and time. He has normal strength. No cranial nerve deficit or sensory deficit. Coordination normal. GCS eye subscore is 4. GCS verbal subscore is 5. GCS motor subscore is 6.  Skin: Skin is warm, dry and intact. No rash noted. No cyanosis.  Psychiatric: He has a normal mood and affect. His speech is normal and behavior is normal. Thought content normal.  Nursing note and vitals reviewed.   ED Course  Procedures   DIAGNOSTIC STUDIES:  Oxygen Saturation is 98% on RA, normal by my interpretation.    COORDINATION OF CARE:  12:05 AM Discussed treatment plan with pt at bedside and pt agreed to plan.  Imaging Review Dg Lumbar Spine Complete  05/05/2015  CLINICAL DATA:  Low back pain, remote history of fall. EXAM: LUMBAR SPINE - COMPLETE 4+ VIEW COMPARISON:  None. FINDINGS: Five non rib-bearing lumbar-type vertebral bodies are intact and aligned with mildly straightened lumbar lordosis. Intervertebral disc heights are normal. No pars interarticularis defects. No destructive bony lesions. Sacroiliac joints are symmetric. Included prevertebral and paraspinal soft tissue planes are non-suspicious. Surgical clips in the included right abdomen compatible with cholecystectomy. Trace aortoiliac vascular calcifications. IMPRESSION: Negative. Electronically Signed   By: Awilda Metro M.D.   On: 05/05/2015 01:37    Ct Head Wo Contrast  05/05/2015  CLINICAL DATA:  45 year old male with headache and blurry lesions. EXAM: CT HEAD WITHOUT CONTRAST TECHNIQUE: Contiguous axial images were obtained from the base of the skull through the vertex without intravenous contrast. COMPARISON:  CT dated 08/09/2014 FINDINGS: There is no acute intracranial hemorrhage. There is mild volume loss with mild moderate periventricular and deep white matter chronic microvascular ischemic changes, advanced for the patient's age. No mass effect or midline shift noted. There is mild mucoperiosteal thickening of paranasal sinuses with partial opacification of the ethmoid sinuses and frontal sinuses. The visualized mastoid air cells are clear. Calvarium is intact. IMPRESSION: No acute intracranial hemorrhage. Stable volume loss and chronic microvascular ischemic changes, advanced for the patient's age. Electronically Signed   By: Elgie Collard M.D.   On: 05/05/2015 01:46   I have personally reviewed and evaluated these images as part of my medical decision-making.   MDM   Final diagnoses:  None   headache Lumbar pain  Patient presents to  the emergency department with complaints of pain "all over". He specifically is complaining of pain in his low back as well as headache. He denies any injuries. He has normal neurologic function. No saddle anesthesia or foot drop. He has normal strength and sensation in lower extremities. He is awake, alert and oriented. CT head unremarkable. X-ray of lumbar spine unremarkable. Patient treated with analgesia.  I personally performed the services described in this documentation, which was scribed in my presence. The recorded information has been reviewed and is accurate.    Gilda Crease, MD 05/05/15 [phone]

## 2015-05-04 NOTE — ED Notes (Signed)
Per ems- called for h/a, abd pain. Is homeless. Is ambulatory.

## 2015-05-04 NOTE — ED Notes (Signed)
Pt to ED via [facility] with c/o headache and back ache "I hurt all over" -- pt is sober tonight, was here yesterday and was intoxicated. Admits to drinking "1 can of beer today"

## 2015-05-05 ENCOUNTER — Emergency Department (HOSPITAL_COMMUNITY): Payer: Medicaid Other

## 2015-05-05 MED ORDER — KETOROLAC TROMETHAMINE 60 MG/2ML IM SOLN
60.0000 mg | Freq: Once | INTRAMUSCULAR | Status: AC
  Administered 2015-05-05: 60 mg via INTRAMUSCULAR
  Filled 2015-05-05: qty 2

## 2015-05-05 NOTE — ED Notes (Addendum)
Patient transported to XR. 

## 2015-05-05 NOTE — ED Notes (Signed)
Patient transported to CT 

## 2015-05-05 NOTE — Discharge Instructions (Signed)
?au n?a ??u. (Migraine Headache) ?au n?a ??u l m?t c?n ?au nhi v nhi?u ? m?t bn ??u c?a qu v?. M?t c?n ?au n?a ??u c th? ko di t? 30 pht ??n vi ti?ng. NGUYN NHN.  Nguyn nhn chnh xc c?a ?au n?a ??u khng ph?i lc no c?ng xc ??nh ???c. Tuy nhin, ?au n?a ??u c th? pht sinh khi cc dy th?n kinh trong no b? kch thch v gi?i phng ra cc ha ch?t gy vim. Hi?n t??ng ny gy ra ?au. M?t s? v?n ?? khc c?ng c th? gy ra ?au n?a ??u, ch?ng h?n:  R??u.  Ht thu?c l.  C?ng th?ng.  Kinh nguy?t.  Pho mt ?? lu.  Th?c ?n ho?c ?? u?ng c ch?a nitrat, glutamate, aspartame, ho?c tyramine.  Thi?u ng?.  S c la.  Caffeine.  ?i.  G?ng s?c.  M?t m?i.  Thu?c dng ?? ?i?u tr? ?au ng?c (nitroglycerine), vin thu?c trnh [place], estrogen, v m?t s? thu?c ?i?u tr? huy?t p. D?U HI?U V TRI?U CH?NG  ?au ? m?t bn ho?c c? hai bn ??u.  ?au t?ng c?n ho?c ?au nhi.  ?au nhi?u lm c?n tr? cc ho?t ??ng hng ngy.  C?n ?au k?ch pht khi c b?t k? ho?t ??ng th? ch?t no.  Bu?n nn, nn m?a, ho?c c? hai.  Chng m?t.  ?au khi ti?p xc v?i nh sng chi, ti?ng ?n l?n, ho?c ho?t ??ng.  Nh?y c?m ton thn v?i nh sng chi, ti?ng ?n l?n, ho?c mi. Tr??c khi qu v? b? ?au n?a ??u, qu v? c th? c nh?ng d?u hi?u c?nh bo s?p c c?n ?au n?a ??u (ti?n tri?u). M?t ti?n tri?u c th? bao g?m:  Nhn th?y nh sng lo ln.  Nhn th?y nh?ng ?i?m sng, qu?ng sng, ho?c cc ???ng ngo?n ngoo.  C th? tr??ng hnh ?ng ho?c nhn m?.  C c?m gic t b ho?c ?au bu?t.  Ni kh.  B? y?u c?. CH?N ?ON  ?au n?a ??u th??ng ???c ch?n ?on d?a vo:  Cc tri?u ch?ng.  Khm th?c th?.  Ch?p CT ho?c MRI ??u qu v?. Cc ki?m tra b?ng hnh ?nh ny khng th? ch?n ?on ???c ?au n?a ??u, nh?ng chng c th? gip lo?i tr? nh?ng nguyn nhn gy ?au ??u khc. ?I?U TR? C th? cho dng thu?c gi?m ?au v ch?ng bu?n nn. C?ng c th? cho dng thu?c ?? ng?n ng?a ti di?n ?au n?a ??u.  H??NG D?N  CH?M [place] T?I NH  Ch? s? d?ng thu?c khng c?n k ??n ho?c thu?c c?n k ??n ?? gi?m ?au ho?c gi?m c?m gic kh ch?u theo ch? d?n c?a chuyn gia ch?m [place] s?c kh?e c?a qu v?. Khng nn dng thu?c m gy nghi?n ko di.  N?m trong m?t phng t?i, yn t?nh khi qu v? b? ?au n?a ??u.  Ghi nh?t k hng ngy ?? tm ra ?i?u g c th? gy cc c?n ?au n?a ??u. Ch?ng h?n, hy ghi ra:  Qu v? ?n v u?ng g.  Qu v? ? ng? bao lu.  B?t k? thay ??i no trong ch? ?? ?n ho?c thu?c men.  H?n ch? s? d?ng r??u.  B? thu?c l, n?u qu v? ht thu?c.  Ng? 7 - 9 ti?ng, ho?c theo khuy?n ngh? c?a chuyn gia ch?m [place] s?c kh?e.  H?n ch? c?ng th?ng.  Gi? cho nh sng d?u nh? n?u nh sng m?nh lm qu v? kh ch?u v lm ch?ng ?au n?a ??  u t?i t? h?n. °NGAY L?P T?C ?I KHÁM N?U:  °· C?n ?au n?a ??u c?a quý v? n?ng h?n. °· Quý v? b? s?t. °· Quý v? b? c?ng c?. °· Quý v? b? m?t th? l?c. °· Quý v? b? y?u c? ho?c m?t ki?m soát c?. °· Quý v? b?t ??u m?t th?ng b?ng ho?c ?i l?i khó kh?n. °· Quý v? c?m th?y mu?n ng?t ho?c ng?t. °· Quý v? có nh?ng tri?u ch?ng n?ng khác v?i nh?ng tri?u ch?ng ban ??u. °??M B?O QUÝ V?:  °· Hi?u rõ các h??ng d?n này. °· S? theo dõi tình tr?ng c?a mình. °· S? yêu c?u tr? giúp ngay l?p t?c n?u quý v? c?m th?y không kh?e ho?c th?y tr?m tr?ng h?n. °  °Thông tin này không nh?m m?c ?ích thay th? cho l?i khuyên mà chuyên gia ch?m sóc s?c kh?e nói v?i quý v?. Hãy b?o ??m quý v? ph?i th?o lu?n b?t k? v?n ?? gì mà quý v? có v?i chuyên gia ch?m sóc s?c kh?e c?a quý v?. °  °Document Released: 03/06/2005 Document Revised: 12/25/2012 °Elsevier Interactive Patient Education ©2016 Elsevier Inc. ° °

## 2015-05-30 ENCOUNTER — Emergency Department (HOSPITAL_COMMUNITY)
Admission: EM | Admit: 2015-05-30 | Discharge: 2015-05-31 | Disposition: A | Discharge status: Home / Self Care | Payer: Medicaid Other | Attending: Emergency Medicine | Admitting: Emergency Medicine

## 2015-05-30 ENCOUNTER — Encounter (HOSPITAL_COMMUNITY): Payer: Self-pay | Admitting: *Deleted

## 2015-05-30 DIAGNOSIS — Z8701 Personal history of pneumonia (recurrent): Secondary | ICD-10-CM | POA: Insufficient documentation

## 2015-05-30 DIAGNOSIS — F10920 Alcohol use, unspecified with intoxication, uncomplicated: Secondary | ICD-10-CM

## 2015-05-30 DIAGNOSIS — I10 Essential (primary) hypertension: Secondary | ICD-10-CM | POA: Insufficient documentation

## 2015-05-30 DIAGNOSIS — Z8709 Personal history of other diseases of the respiratory system: Secondary | ICD-10-CM | POA: Insufficient documentation

## 2015-05-30 DIAGNOSIS — Z8673 Personal history of transient ischemic attack (TIA), and cerebral infarction without residual deficits: Secondary | ICD-10-CM | POA: Insufficient documentation

## 2015-05-30 DIAGNOSIS — F10120 Alcohol abuse with intoxication, uncomplicated: Secondary | ICD-10-CM | POA: Insufficient documentation

## 2015-05-30 DIAGNOSIS — Z8719 Personal history of other diseases of the digestive system: Secondary | ICD-10-CM | POA: Insufficient documentation

## 2015-05-30 DIAGNOSIS — I252 Old myocardial infarction: Secondary | ICD-10-CM | POA: Insufficient documentation

## 2015-05-30 DIAGNOSIS — G8929 Other chronic pain: Secondary | ICD-10-CM | POA: Insufficient documentation

## 2015-05-30 DIAGNOSIS — F17210 Nicotine dependence, cigarettes, uncomplicated: Secondary | ICD-10-CM | POA: Insufficient documentation

## 2015-05-30 DIAGNOSIS — Z8739 Personal history of other diseases of the musculoskeletal system and connective tissue: Secondary | ICD-10-CM | POA: Insufficient documentation
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
## 2015-05-30 NOTE — ED Notes (Signed)
[facility] Expected date:  Expected time:  Means of arrival:  Comments: EMS- ETOH/SI

## 2015-05-31 NOTE — Discharge Instructions (Signed)
Ph? Thu?c vo Ha Ch?t (Chemical Dependency) Ph? thu?c vo ha ch?t l ch?ng nghi?n ma ty ho?c r??u. N ???c ??c tr?ng b?i hnh vi tm ki?m v s? d?ng ma ty v r??u l?p ?i l?p l?i b?t ch?p h?u qu? c h?i cho s?c kh?e v an ton c?a b?n thn v ng??i khc.  CC Y?U T? NGUY C? C nh?ng tnh hu?ng ho?c hnh vi nh?t ??nh lm t?ng nguy c? ph? thu?c vo ha ch?t c?a m?t ng??i. Cc tnh hu?ng ny bao g?m:  Ti?n s? gia ?nh ph? thu?c vo ha ch?t.  Ti?n s? c cc v?n ?? s?c kh?e tm th?n, bao g?m c? tr?m c?m v lo u.  M?t mi tr??ng gia ?nh, n?i m ma ty v r??u c th? d? dng ???c s? d?ng.  S? d?ng ma ty ho?c r??u ? ?? tu?i tr?. TRI?U CH?NG Cc tri?u ch?ng sau ?y c th? cho th?y s? ph? thu?c vo ha ch?t:  Khng c kh? n?ng h?n ch? s? d?ng ma ty ho?c r??u.  Hi?n t??ng bu?n nn, ra m? hi, run r?y v lo l?ng x?y ra khi khng s? d?ng r??u ho?c ma ty.  Gia t?ng l??ng ma ty ho?c r??u c?n ?? ph thu?c ho?c say r??u. Nh?ng ng??i c nh?ng tri?u ch?ng ny c th? ?nh gi vi?c s? d?ng ma ty v r??u c?a h? b?ng cch t? h?i cc cu h?i sau:  B?n b ho?c gia ?nh c t?ng ni v?i b?n r?ng h? lo l?ng v? vi?c b?n s? d?ng r??u hay ma ty khng?  B?n b v gia ?nh c bao gi? ni cho b?n bi?t v? nh?ng ?i?u b?n ? lm trong khi u?ng r??u ho?c s? d?ng ma ty m b?n khng nh? khng?  B?n c ni d?i v? vi?c s? d?ng r??u ho?c ma ty ho?c v? s? l??ng m b?n s? d?ng khng?  B?n c g?p kh kh?n trong vi?c hon thnh nhi?m v? hng ngy tr? khi b?n s? d?ng r??u hay ma ty khng?  M?c ?? hi?u su?t cng vi?c ho?c k?t qu? h?c t?p c?a b?n c gi?m st v vi?c b?n s? d?ng ma ty ho?c r??u khng?  B?n c b? ?m do s? d?ng ma ty ho?c r??u, nh?ng v?n ti?p t?c s? d?ng khng?  B?n c c?m th?y khng tho?i mi trong cc tnh hu?ng x h?i, tr? khi b?n s? d?ng r??u hay ma ty khng?  B?n c s? d?ng ma ty ho?c r??u ?? gip qun ?i m?i v?n ?? khng? N?u tr? l?i c cho b?t k? cu h?i no trong s? ny c th? cho th?y  s? ph? thu?c vo ha ch?t. B?n c?n ?nh gi c?a chuyn gia.   Thng tin ny khng nh?m m?c ?ch thay th? cho l?i khuyn m chuyn gia ch?m [place] s?c kh?e ni v?i qu v?. Hy b?o ??m qu v? ph?i th?o lu?n b?t k? v?n ?? g m qu v? c v?i chuyn gia ch?m [place] s?c kh?e c?a qu v?.   Document Released: 03/06/2005 Document Revised: 11/06/2012 Elsevier Interactive Patient Education [employer]2016 Elsevier Inc.

## 2015-12-19 ENCOUNTER — Emergency Department (HOSPITAL_COMMUNITY)
Admission: EM | Admit: 2015-12-19 | Discharge: 2015-12-19 | Disposition: A | Discharge status: Home / Self Care | Payer: No Typology Code available for payment source | Attending: Emergency Medicine | Admitting: Emergency Medicine

## 2015-12-19 ENCOUNTER — Emergency Department (HOSPITAL_COMMUNITY): Payer: No Typology Code available for payment source

## 2015-12-19 ENCOUNTER — Encounter (HOSPITAL_COMMUNITY): Payer: Self-pay

## 2015-12-19 DIAGNOSIS — Y939 Activity, unspecified: Secondary | ICD-10-CM | POA: Insufficient documentation

## 2015-12-19 DIAGNOSIS — S2242XA Multiple fractures of ribs, left side, initial encounter for closed fracture: Secondary | ICD-10-CM | POA: Diagnosis not present

## 2015-12-19 DIAGNOSIS — R52 Pain, unspecified: Secondary | ICD-10-CM

## 2015-12-19 DIAGNOSIS — Y999 Unspecified external cause status: Secondary | ICD-10-CM | POA: Insufficient documentation

## 2015-12-19 DIAGNOSIS — Y92410 Unspecified street and highway as the place of occurrence of the external cause: Secondary | ICD-10-CM | POA: Insufficient documentation

## 2015-12-19 DIAGNOSIS — R51 Headache: Secondary | ICD-10-CM | POA: Insufficient documentation

## 2015-12-19 DIAGNOSIS — F17210 Nicotine dependence, cigarettes, uncomplicated: Secondary | ICD-10-CM | POA: Diagnosis not present

## 2015-12-19 DIAGNOSIS — S299XXA Unspecified injury of thorax, initial encounter: Secondary | ICD-10-CM | POA: Diagnosis present

## 2015-12-19 DIAGNOSIS — S161XXA Strain of muscle, fascia and tendon at neck level, initial encounter: Secondary | ICD-10-CM | POA: Insufficient documentation

## 2015-12-19 MED ORDER — TRAMADOL HCL 50 MG PO TABS: 50.0000 mg | ORAL_TABLET | Freq: Four times a day (QID) | ORAL | 0 refills | Status: DC | PRN
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
## 2015-12-19 NOTE — ED Notes (Signed)
While RN was in another room, pt left the ED.  RN informed MD who stated that he had just spoke to the pt's father and he was coming to pick pt up.
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
## 2015-12-21 ENCOUNTER — Encounter (HOSPITAL_COMMUNITY): Payer: Self-pay | Admitting: Emergency Medicine

## 2015-12-21 ENCOUNTER — Encounter (HOSPITAL_COMMUNITY): Payer: Self-pay | Admitting: *Deleted

## 2015-12-21 ENCOUNTER — Emergency Department (HOSPITAL_COMMUNITY)
Admission: EM | Admit: 2015-12-21 | Discharge: 2015-12-21 | Disposition: A | Discharge status: Home / Self Care | Payer: No Typology Code available for payment source | Attending: Emergency Medicine | Admitting: Emergency Medicine

## 2015-12-21 DIAGNOSIS — Y939 Activity, unspecified: Secondary | ICD-10-CM | POA: Insufficient documentation

## 2015-12-21 DIAGNOSIS — S32028S Other fracture of second lumbar vertebra, sequela: Secondary | ICD-10-CM | POA: Insufficient documentation

## 2015-12-21 DIAGNOSIS — Y92410 Unspecified street and highway as the place of occurrence of the external cause: Secondary | ICD-10-CM | POA: Diagnosis not present

## 2015-12-21 DIAGNOSIS — F17210 Nicotine dependence, cigarettes, uncomplicated: Secondary | ICD-10-CM | POA: Insufficient documentation

## 2015-12-21 DIAGNOSIS — S299XXA Unspecified injury of thorax, initial encounter: Secondary | ICD-10-CM | POA: Diagnosis present

## 2015-12-21 DIAGNOSIS — Y999 Unspecified external cause status: Secondary | ICD-10-CM | POA: Insufficient documentation

## 2015-12-21 DIAGNOSIS — S32020S Wedge compression fracture of second lumbar vertebra, sequela: Secondary | ICD-10-CM

## 2015-12-21 DIAGNOSIS — S2242XA Multiple fractures of ribs, left side, initial encounter for closed fracture: Secondary | ICD-10-CM | POA: Diagnosis not present

## 2015-12-21 HISTORY — DX: Homelessness: Z59.0

## 2015-12-21 HISTORY — DX: Homelessness unspecified: Z59.00

## 2015-12-21 MED ORDER — IBUPROFEN 600 MG PO TABS: 600.0000 mg | ORAL_TABLET | Freq: Four times a day (QID) | ORAL | 0 refills | Status: DC | PRN

## 2015-12-21 MED ORDER — HYDROCODONE-ACETAMINOPHEN 5-325 MG PO TABS: 2.0000 | ORAL_TABLET | ORAL | 0 refills | Status: DC | PRN

## 2015-12-21 MED ORDER — KETOROLAC TROMETHAMINE 60 MG/2ML IM SOLN
60.0000 mg | Freq: Once | INTRAMUSCULAR | Status: AC
  Administered 2015-12-21: 60 mg via INTRAMUSCULAR
  Filled 2015-12-21: qty 2

## 2015-12-21 MED ORDER — HYDROCODONE-ACETAMINOPHEN 5-325 MG PO TABS
2.0000 | ORAL_TABLET | Freq: Once | ORAL | Status: AC
  Administered 2015-12-21: 2 via ORAL
  Filled 2015-12-21: qty 2
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
## 2015-12-21 NOTE — ED Triage Notes (Signed)
Pt. reports low back pain onset 2 days ago , denies dysuria or hematuria , seen here yesterday for the same complaint .

## 2015-12-22 ENCOUNTER — Emergency Department (HOSPITAL_COMMUNITY)
Admission: EM | Admit: 2015-12-22 | Discharge: 2015-12-22 | Disposition: A | Discharge status: Home / Self Care | Payer: Medicaid Other | Attending: Emergency Medicine | Admitting: Emergency Medicine

## 2015-12-22 ENCOUNTER — Encounter (HOSPITAL_COMMUNITY): Payer: Self-pay | Admitting: Emergency Medicine

## 2015-12-22 DIAGNOSIS — I10 Essential (primary) hypertension: Secondary | ICD-10-CM | POA: Insufficient documentation

## 2015-12-22 DIAGNOSIS — F17210 Nicotine dependence, cigarettes, uncomplicated: Secondary | ICD-10-CM | POA: Insufficient documentation

## 2015-12-22 DIAGNOSIS — I252 Old myocardial infarction: Secondary | ICD-10-CM | POA: Insufficient documentation

## 2015-12-22 DIAGNOSIS — Z8673 Personal history of transient ischemic attack (TIA), and cerebral infarction without residual deficits: Secondary | ICD-10-CM | POA: Insufficient documentation

## 2015-12-22 DIAGNOSIS — R0781 Pleurodynia: Secondary | ICD-10-CM | POA: Insufficient documentation
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
## 2015-12-22 NOTE — ED Notes (Signed)
Pt coming to nurses station cursing and stating he's sick and needs pain medicine.

## 2015-12-22 NOTE — ED Triage Notes (Signed)
Pt to ED via [facility] -- pt c/o back pain "I can't sleep and I can't breath" hurts to breath-- pt was seen here yesterday states "the shot you gace me and pills you gave me yesterday helped me-- I need another one"
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
## 2015-12-25 ENCOUNTER — Encounter (HOSPITAL_COMMUNITY): Payer: Self-pay

## 2015-12-25 ENCOUNTER — Emergency Department (HOSPITAL_COMMUNITY)
Admission: EM | Admit: 2015-12-25 | Discharge: 2015-12-25 | Disposition: A | Discharge status: Home / Self Care | Payer: No Typology Code available for payment source | Attending: Emergency Medicine | Admitting: Emergency Medicine

## 2015-12-25 DIAGNOSIS — I10 Essential (primary) hypertension: Secondary | ICD-10-CM | POA: Insufficient documentation

## 2015-12-25 DIAGNOSIS — F1010 Alcohol abuse, uncomplicated: Secondary | ICD-10-CM | POA: Diagnosis not present

## 2015-12-25 DIAGNOSIS — F17210 Nicotine dependence, cigarettes, uncomplicated: Secondary | ICD-10-CM | POA: Diagnosis not present

## 2015-12-25 DIAGNOSIS — M545 Low back pain, unspecified: Secondary | ICD-10-CM

## 2015-12-25 DIAGNOSIS — Y999 Unspecified external cause status: Secondary | ICD-10-CM | POA: Diagnosis not present

## 2015-12-25 DIAGNOSIS — Z791 Long term (current) use of non-steroidal anti-inflammatories (NSAID): Secondary | ICD-10-CM | POA: Insufficient documentation

## 2015-12-25 DIAGNOSIS — Y939 Activity, unspecified: Secondary | ICD-10-CM | POA: Diagnosis not present

## 2015-12-25 DIAGNOSIS — Y92410 Unspecified street and highway as the place of occurrence of the external cause: Secondary | ICD-10-CM | POA: Diagnosis not present

## 2015-12-25 DIAGNOSIS — Z79899 Other long term (current) drug therapy: Secondary | ICD-10-CM | POA: Insufficient documentation

## 2015-12-25 MED ORDER — IBUPROFEN 200 MG PO TABS
400.0000 mg | ORAL_TABLET | Freq: Once | ORAL | Status: AC
  Administered 2015-12-25: 400 mg via ORAL
  Filled 2015-12-25: qty 2
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
## 2015-12-25 NOTE — Discharge Instructions (Signed)
Take Advil as directed for pain. [facility] to get a primary care physician. Your blood pressure should be rechecked within a week. Today's is elevated at 173/99

## 2015-12-27 ENCOUNTER — Encounter (HOSPITAL_COMMUNITY): Payer: Self-pay

## 2015-12-27 ENCOUNTER — Encounter (HOSPITAL_COMMUNITY): Payer: Self-pay | Admitting: Emergency Medicine

## 2015-12-27 ENCOUNTER — Emergency Department (HOSPITAL_COMMUNITY)
Admission: EM | Admit: 2015-12-27 | Discharge: 2015-12-28 | Disposition: A | Discharge status: Home / Self Care | Payer: No Typology Code available for payment source | Attending: Emergency Medicine | Admitting: Emergency Medicine

## 2015-12-27 ENCOUNTER — Emergency Department (HOSPITAL_COMMUNITY)
Admission: EM | Admit: 2015-12-27 | Discharge: 2015-12-27 | Disposition: A | Discharge status: Home / Self Care | Payer: No Typology Code available for payment source | Source: Home / Self Care | Attending: Emergency Medicine | Admitting: Emergency Medicine

## 2015-12-27 DIAGNOSIS — Z8673 Personal history of transient ischemic attack (TIA), and cerebral infarction without residual deficits: Secondary | ICD-10-CM | POA: Insufficient documentation

## 2015-12-27 DIAGNOSIS — R0781 Pleurodynia: Secondary | ICD-10-CM

## 2015-12-27 DIAGNOSIS — F1010 Alcohol abuse, uncomplicated: Secondary | ICD-10-CM | POA: Diagnosis not present

## 2015-12-27 DIAGNOSIS — I252 Old myocardial infarction: Secondary | ICD-10-CM | POA: Insufficient documentation

## 2015-12-27 DIAGNOSIS — S2241XD Multiple fractures of ribs, right side, subsequent encounter for fracture with routine healing: Secondary | ICD-10-CM | POA: Diagnosis present

## 2015-12-27 DIAGNOSIS — F17210 Nicotine dependence, cigarettes, uncomplicated: Secondary | ICD-10-CM | POA: Insufficient documentation

## 2015-12-27 DIAGNOSIS — I10 Essential (primary) hypertension: Secondary | ICD-10-CM | POA: Insufficient documentation

## 2015-12-27 DIAGNOSIS — R4182 Altered mental status, unspecified: Secondary | ICD-10-CM | POA: Insufficient documentation

## 2015-12-27 DIAGNOSIS — M545 Low back pain: Secondary | ICD-10-CM | POA: Insufficient documentation

## 2015-12-27 LAB — URINALYSIS, ROUTINE W REFLEX MICROSCOPIC
BILIRUBIN URINE: NEGATIVE
GLUCOSE, UA: NEGATIVE mg/dL
Hgb urine dipstick: NEGATIVE
Ketones, ur: 15 mg/dL — AB
Leukocytes, UA: NEGATIVE
Nitrite: NEGATIVE
Protein, ur: NEGATIVE mg/dL
SPECIFIC GRAVITY, URINE: 1.01 (ref 1.005–1.030)
pH: 6.5 (ref 5.0–8.0)

## 2015-12-27 MED ORDER — METHOCARBAMOL 500 MG PO TABS
500.0000 mg | ORAL_TABLET | Freq: Once | ORAL | Status: AC
  Administered 2015-12-27: 500 mg via ORAL
  Filled 2015-12-27: qty 1

## 2015-12-27 MED ORDER — KETOROLAC TROMETHAMINE 15 MG/ML IJ SOLN
30.0000 mg | Freq: Once | INTRAMUSCULAR | Status: AC
  Administered 2015-12-27: 30 mg via INTRAMUSCULAR
  Filled 2015-12-27: qty 2

## 2015-12-27 NOTE — ED Notes (Signed)
Pt requested something to eat and 2 cokes to drink. Pt given a Mal[place]key sandwich, apple sauce, graham crackers and peanut butter to eat and 2 cokes to drink

## 2015-12-27 NOTE — ED Triage Notes (Signed)
Pt comes via [facility], c/o back pain states that is it numb also having trouble urinating that started today. Hx of kidney stones.

## 2015-12-27 NOTE — ED Notes (Addendum)
Pt cannot tell me his name, DOB, or reasons for calling 911; however he can tell me he is hungry; pt states he can speak english
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
## 2015-12-27 NOTE — ED Notes (Signed)
Attempted IV x 2 with no success. 
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
## 2015-12-28 ENCOUNTER — Emergency Department (HOSPITAL_COMMUNITY): Payer: No Typology Code available for payment source

## 2015-12-28 LAB — CBC WITH DIFFERENTIAL/PLATELET
BASOS ABS: 0 10*3/uL (ref 0.0–0.1)
BASOS PCT: 0 %
Eosinophils Absolute: 0.2 10*3/uL (ref 0.0–0.7)
Eosinophils Relative: 3 %
HEMATOCRIT: 45.4 % (ref 39.0–52.0)
HEMOGLOBIN: 15.4 g/dL (ref 13.0–17.0)
LYMPHS PCT: 34 %
Lymphs Abs: 2.4 10*3/uL (ref 0.7–4.0)
MCH: 30.7 pg (ref 26.0–34.0)
MCHC: 33.9 g/dL (ref 30.0–36.0)
MCV: 90.4 fL (ref 78.0–100.0)
Monocytes Absolute: 0.6 10*3/uL (ref 0.1–1.0)
Monocytes Relative: 9 %
NEUTROS ABS: 3.9 10*3/uL (ref 1.7–7.7)
NEUTROS PCT: 54 %
Platelets: 194 10*3/uL (ref 150–400)
RBC: 5.02 MIL/uL (ref 4.22–5.81)
RDW: 13.3 % (ref 11.5–15.5)
WBC: 7.1 10*3/uL (ref 4.0–10.5)

## 2015-12-28 LAB — BASIC METABOLIC PANEL
Anion gap: 13 (ref 5–15)
BUN: 9 mg/dL (ref 6–20)
CALCIUM: 8.9 mg/dL (ref 8.9–10.3)
CHLORIDE: 106 mmol/L (ref 101–111)
CO2: 22 mmol/L (ref 22–32)
Creatinine, Ser: 0.79 mg/dL (ref 0.61–1.24)
GFR calc non Af Amer: >60 mL/min (ref >60)
Glucose, Bld: 85 mg/dL (ref 65–99)
POTASSIUM: 3.4 mmol/L — AB (ref 3.5–5.1)
Sodium: 141 mmol/L (ref 135–145)

## 2015-12-28 LAB — ETHANOL: Alcohol, Ethyl (B): 147 mg/dL — ABNORMAL HIGH (ref <5)

## 2015-12-28 MED ORDER — IOPAMIDOL (ISOVUE-370) INJECTION 76%
INTRAVENOUS | Status: AC
  Administered 2015-12-28: 100 mL
  Filled 2015-12-28: qty 100

## 2015-12-28 MED ORDER — KETOROLAC TROMETHAMINE 60 MG/2ML IM SOLN
60.0000 mg | Freq: Once | INTRAMUSCULAR | Status: AC
  Administered 2015-12-28: 60 mg via INTRAMUSCULAR
  Filled 2015-12-28: qty 2

## 2015-12-28 NOTE — ED Notes (Signed)
Patient taken to CT.

## 2015-12-31 ENCOUNTER — Emergency Department (HOSPITAL_COMMUNITY)
Admission: EM | Admit: 2015-12-31 | Discharge: 2015-12-31 | Disposition: A | Discharge status: Home / Self Care | Payer: Medicaid Other | Attending: Emergency Medicine | Admitting: Emergency Medicine

## 2015-12-31 ENCOUNTER — Encounter (HOSPITAL_COMMUNITY): Payer: Self-pay | Admitting: Emergency Medicine

## 2015-12-31 DIAGNOSIS — Z8673 Personal history of transient ischemic attack (TIA), and cerebral infarction without residual deficits: Secondary | ICD-10-CM | POA: Insufficient documentation

## 2015-12-31 DIAGNOSIS — G8929 Other chronic pain: Secondary | ICD-10-CM | POA: Insufficient documentation

## 2015-12-31 DIAGNOSIS — F17210 Nicotine dependence, cigarettes, uncomplicated: Secondary | ICD-10-CM | POA: Insufficient documentation

## 2015-12-31 DIAGNOSIS — M549 Dorsalgia, unspecified: Secondary | ICD-10-CM

## 2015-12-31 DIAGNOSIS — I10 Essential (primary) hypertension: Secondary | ICD-10-CM | POA: Insufficient documentation

## 2015-12-31 DIAGNOSIS — M545 Low back pain: Secondary | ICD-10-CM | POA: Insufficient documentation

## 2015-12-31 DIAGNOSIS — Z79899 Other long term (current) drug therapy: Secondary | ICD-10-CM | POA: Insufficient documentation

## 2015-12-31 DIAGNOSIS — I252 Old myocardial infarction: Secondary | ICD-10-CM | POA: Insufficient documentation

## 2015-12-31 LAB — URINALYSIS, ROUTINE W REFLEX MICROSCOPIC
Bilirubin Urine: NEGATIVE
Glucose, UA: NEGATIVE mg/dL
HGB URINE DIPSTICK: NEGATIVE
KETONES UR: NEGATIVE mg/dL
LEUKOCYTES UA: NEGATIVE
Nitrite: NEGATIVE
PROTEIN: NEGATIVE mg/dL
Specific Gravity, Urine: 1.017 (ref 1.005–1.030)
pH: 5.5 (ref 5.0–8.0)

## 2015-12-31 MED ORDER — ACETAMINOPHEN 500 MG PO TABS
1000.0000 mg | ORAL_TABLET | Freq: Once | ORAL | Status: AC
  Administered 2015-12-31: 1000 mg via ORAL
  Filled 2015-12-31: qty 2

## 2015-12-31 NOTE — ED Provider Notes (Signed)
[facility]-EMERGENCY DEPT Provider Note   CSN: [id number] Arrival date & time: 12/31/15  0146     History   Chief Complaint Chief Complaint  Patient presents with  . Flank Pain    HPI Dustin Hansen is a 45 y.o. male.  The history is provided by the patient.  Back Pain   This is a chronic problem. The current episode started more than 1 week ago. The problem occurs constantly. The problem has not changed since onset.The pain is present in the sacro-iliac joint. The pain does not radiate. The pain is moderate. The symptoms are aggravated by bending and twisting. The pain is the same all the time. Pertinent negatives include no chest pain, no fever, no numbness, no weight loss, no headaches, no abdominal pain, no abdominal swelling, no bowel incontinence, no perianal numbness, no bladder incontinence, no dysuria, no pelvic pain, no leg pain, no paresthesias, no paresis, no tingling and no weakness. He has tried nothing (states he knows his pain is chronic but can't afford meds.  ) for the symptoms. The treatment provided no relief. Risk factors: none.  States he knows his pain is chronic and has been prescribed meds but can't afford them.  Mainly he would like something to eat  Past Medical History:  Diagnosis Date  . AAA (abdominal aortic aneurysm) (HCC) 10/2010   4.1cm on CT scan  . Anxiety   . Arthritis 11/08/2011   "feet and knees"  . Back pain, chronic   . Bipolar 1 disorder (HCC)   . Burning with urination 11/08/2011  . Chronic bronchitis (HCC) 11/08/2011   "get it q month"  . Daily headache 11/08/2011  . Depression   . GERD (gastroesophageal reflux disease)   . Hepatitis 11/08/2011   "not sure which one"  . History of stomach ulcers 11/08/2011  . Homelessness   . Hypertension   . Mental disorder 11/08/2011   "I get crazy; I take medicine"  . Myocardial infarction    "think so; when I was young; used to go to heart dr"  . Pneumonia   . Schizo-affective schizophrenia (HCC)   .  Seizures (HCC) 11/08/2011   "jerking kind"  . Shortness of breath 11/08/2011   "lying down"  . Stroke (HCC) 11/08/2011   points to right chest    Patient Active Problem List   Diagnosis Date Noted  . Chronic pancreatitis (HCC) 11/14/2011  . Cannabis abuse 11/09/2011  . Hepatitis 11/08/2011  . Hyperglycemia 11/08/2011  . Alcohol abuse 11/08/2011  . Transaminasemia 11/08/2011  . Elevated lipase 11/08/2011  . Chronic back pain 02/07/2011  . Insomnia 02/07/2011    Past Surgical History:  Procedure Laterality Date  . CHOLECYSTECTOMY  2008  . EUS  11/14/2011   Procedure: FULL UPPER ENDOSCOPIC ULTRASOUND (EUS) RADIAL;  Surgeon: Willis ModenaWilliam Outlaw, MD;  Location: [facility];  Service: Endoscopy;  Laterality: N/A;  [facility]       Home Medications    Prior to Admission medications   Medication Sig Start Date End Date Taking? Authorizing Provider  HYDROcodone-acetaminophen (NORCO/VICODIN) 5-325 MG per tablet Take 1 tablet by mouth every 6 (six) hours as needed for moderate pain. Patient not taking: Reported on 12/27/2015 05/11/14   Charlestine Nighthristopher Lawyer, PA-C  HYDROcodone-acetaminophen (NORCO/VICODIN) 5-325 MG tablet Take 2 tablets by mouth every 4 (four) hours as needed. Patient not taking: Reported on 12/27/2015 12/21/15   Elson AreasLeslie K Sofia, PA-C  ibuprofen (ADVIL,MOTRIN) 600 MG tablet Take 1 tablet (600 mg total)  by mouth every 6 (six) hours as needed. Patient not taking: Reported on 12/27/2015 12/21/15   Elson Areas, PA-C  ibuprofen (ADVIL,MOTRIN) 800 MG tablet Take 1 tablet (800 mg total) by mouth every 8 (eight) hours as needed. Patient not taking: Reported on 12/27/2015 05/11/14   Charlestine Night, PA-C  traMADol (ULTRAM) 50 MG tablet Take 1 tablet (50 mg total) by mouth every 6 (six) hours as needed. Patient not taking: Reported on 12/27/2015 12/19/15   Maia Plan, MD    Family History Family History  Problem Relation Age of Onset  . Hypertension Mother     Social  History Social History  Substance Use Topics  . Smoking status: Current Every Day Smoker    Packs/day: 0.50    Years: 29.00    Types: Cigarettes  . Smokeless tobacco: Never Used  . Alcohol use Yes     Comment: 11/08/2011 "4-5 bottles beer/wk"     Allergies   Review of patient's allergies indicates no known allergies.   Review of Systems Review of Systems  Constitutional: Negative for fever and weight loss.  Cardiovascular: Negative for chest pain.  Gastrointestinal: Negative for abdominal pain, bowel incontinence, nausea and vomiting.  Genitourinary: Negative for bladder incontinence, difficulty urinating, dysuria and pelvic pain.  Musculoskeletal: Positive for back pain. Negative for myalgias, neck pain and neck stiffness.  Neurological: Negative for tingling, weakness, numbness, headaches and paresthesias.  All other systems reviewed and are negative.    Physical Exam Updated Vital Signs BP 143/94 (BP Location: Left Arm)   Pulse 74   Temp 97.7 F (36.5 C) (Oral)   Resp 20   SpO2 99%   Physical Exam  Constitutional: He is oriented to person, place, and time. He appears well-developed and well-nourished. No distress.  Smells of ETOH  HENT:  Head: Normocephalic and atraumatic.  Nose: Nose normal.  Mouth/Throat: No oropharyngeal exudate.  Eyes: EOM are normal. Pupils are equal, round, and reactive to light.  Neck: Normal range of motion. Neck supple. No JVD present.  Cardiovascular: Normal rate, regular rhythm and intact distal pulses.   Pulmonary/Chest: Effort normal and breath sounds normal. He has no wheezes. He has no rales.  Abdominal: Soft. Bowel sounds are normal. He exhibits no distension and no mass. There is no tenderness. There is no rebound and no guarding. No hernia.  Musculoskeletal: Normal range of motion.       Cervical back: Normal.       Thoracic back: Normal.       Lumbar back: Normal.  Lymphadenopathy:    He has no cervical adenopathy.    Neurological: He is alert and oriented to person, place, and time. He has normal reflexes.  Skin: Skin is warm and dry. Capillary refill takes less than 2 seconds.  Psychiatric: He has a normal mood and affect.     ED Treatments / Results   Vitals:   12/31/15 0203 12/31/15 0453  BP: 149/99 143/94  Pulse: 87 74  Resp: 16 20  Temp: 97.7 F (36.5 C)      Procedures Procedures (including critical care time)  Medications Ordered in ED Medications  acetaminophen (TYLENOL) tablet 1,000 mg (not administered)     Initial Impression / Assessment and Plan / ED Course   Vitals:   12/31/15 0203 12/31/15 0453  BP: 149/99 143/94  Pulse: 87 74  Resp: 16 20  Temp: 97.7 F (36.5 C)      Final Clinical Impressions(s) / ED Diagnoses  No red flags nothing new about the pain exam and vitals are benign and reassuring.  pancytopenia continued care by those familar hematology pancytopenia continued care by those familar hematology      Makisha Marrin, MD 12/31/15 [phone]
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
## 2015-12-31 NOTE — ED Triage Notes (Signed)
Pt no longer visible in the lobby when ready for a room

## 2015-12-31 NOTE — ED Triage Notes (Signed)
Pt asleep in lobby, in no acute distress

## 2015-12-31 NOTE — ED Notes (Signed)
Patient is A & O x4.  He understood his discharge instructions.  

## 2016-01-02 ENCOUNTER — Emergency Department (HOSPITAL_COMMUNITY)
Admission: EM | Admit: 2016-01-02 | Discharge: 2016-01-02 | Disposition: A | Discharge status: Home / Self Care | Payer: Medicaid Other | Attending: Emergency Medicine | Admitting: Emergency Medicine

## 2016-01-02 ENCOUNTER — Encounter (HOSPITAL_COMMUNITY): Payer: Self-pay

## 2016-01-02 DIAGNOSIS — I252 Old myocardial infarction: Secondary | ICD-10-CM | POA: Insufficient documentation

## 2016-01-02 DIAGNOSIS — I10 Essential (primary) hypertension: Secondary | ICD-10-CM | POA: Insufficient documentation

## 2016-01-02 DIAGNOSIS — M545 Low back pain, unspecified: Secondary | ICD-10-CM

## 2016-01-02 DIAGNOSIS — Z79899 Other long term (current) drug therapy: Secondary | ICD-10-CM | POA: Insufficient documentation

## 2016-01-02 DIAGNOSIS — F17210 Nicotine dependence, cigarettes, uncomplicated: Secondary | ICD-10-CM | POA: Insufficient documentation

## 2016-01-02 DIAGNOSIS — Z8673 Personal history of transient ischemic attack (TIA), and cerebral infarction without residual deficits: Secondary | ICD-10-CM | POA: Insufficient documentation

## 2016-01-02 LAB — URINALYSIS, ROUTINE W REFLEX MICROSCOPIC
Bilirubin Urine: NEGATIVE
Glucose, UA: NEGATIVE mg/dL
Hgb urine dipstick: NEGATIVE
Ketones, ur: NEGATIVE mg/dL
LEUKOCYTES UA: NEGATIVE
NITRITE: NEGATIVE
PROTEIN: NEGATIVE mg/dL
Specific Gravity, Urine: 1.019 (ref 1.005–1.030)
pH: 6 (ref 5.0–8.0)

## 2016-01-02 MED ORDER — TRAMADOL HCL 50 MG PO TABS
50.0000 mg | ORAL_TABLET | Freq: Once | ORAL | Status: AC
  Administered 2016-01-02: 50 mg via ORAL
  Filled 2016-01-02: qty 1

## 2016-01-02 MED ORDER — DIAZEPAM 5 MG PO TABS
5.0000 mg | ORAL_TABLET | Freq: Once | ORAL | Status: AC
  Administered 2016-01-02: 5 mg via ORAL
  Filled 2016-01-02: qty 1

## 2016-01-02 MED ORDER — DEXAMETHASONE SODIUM PHOSPHATE 10 MG/ML IJ SOLN
10.0000 mg | Freq: Once | INTRAMUSCULAR | Status: AC
  Administered 2016-01-02: 10 mg via INTRAVENOUS
  Filled 2016-01-02: qty 1

## 2016-01-02 MED ORDER — TRAMADOL HCL 50 MG PO TABS: 50.0000 mg | ORAL_TABLET | Freq: Two times a day (BID) | ORAL | 0 refills | Status: DC | PRN

## 2016-01-02 MED ORDER — CYCLOBENZAPRINE HCL 10 MG PO TABS: 10.0000 mg | ORAL_TABLET | Freq: Two times a day (BID) | ORAL | 0 refills | Status: DC | PRN

## 2016-01-02 MED ORDER — DEXAMETHASONE SODIUM PHOSPHATE 10 MG/ML IJ SOLN: 10.0000 mg | Freq: Once | INTRAMUSCULAR | Status: DC

## 2016-01-02 MED ORDER — KETOROLAC TROMETHAMINE 30 MG/ML IJ SOLN
30.0000 mg | Freq: Once | INTRAMUSCULAR | Status: AC
  Administered 2016-01-02: 30 mg via INTRAVENOUS
  Filled 2016-01-02: qty 1

## 2016-01-02 MED ORDER — KETOROLAC TROMETHAMINE 60 MG/2ML IM SOLN: 60.0000 mg | Freq: Once | INTRAMUSCULAR | Status: DC

## 2016-01-02 NOTE — ED Notes (Signed)
PT ambulated to restroom without difficulty. Urine sample obtained.

## 2016-01-02 NOTE — Discharge Instructions (Signed)
See PCP on your insurance card for follow up.

## 2016-01-02 NOTE — ED Notes (Signed)
Pt too drowsy to ambulated at this time.

## 2016-01-02 NOTE — ED Triage Notes (Signed)
Lower back pain for months worse tonight going to ER everyday per pt to get pain medication no bowel or bladder problems.

## 2016-01-02 NOTE — ED Notes (Signed)
Pt left without discharge instructions.

## 2016-01-02 NOTE — ED Notes (Signed)
[facility] Expected date:  Expected time:  Means of arrival:  Comments: 45 yo M/ Back pain

## 2016-01-02 NOTE — ED Notes (Signed)
No respiratory or acute distress noted alert and oriented x 3 no reaction to medication noted. 

## 2016-01-02 NOTE — ED Provider Notes (Signed)
[facility]-EMERGENCY DEPT Provider Note   CSN: [id number] Arrival date & time: 01/02/16  0004   By signing my name below, I, Dustin Hansen, attest that this documentation has been prepared under the direction and in the presence of non-physician practitioner, Danelle Berry, PA-C. Electronically Signed: Nelwyn Hansen, Scribe. 01/02/2016. 12:54 AM.  History   Chief Complaint Chief Complaint  Patient presents with  . Back Pain   HPI  HPI Comments:  Dustin Hansen is a 45 y.o. male who presents to the Emergency Department complaining of chronic constant unchanged left-sided back pain beginning more than a week ago. No modifying factors indicated.  Pt has been seen in the ED several times in the past two weeks for the same symptoms but has not filled his given prescriptions. Pt has been able to ambulate.  No N, V, fevers, abdominal pain, LE numbness or weakness, no urinary retention or incontinence.  Past Medical History:  Diagnosis Date  . AAA (abdominal aortic aneurysm) (HCC) 10/2010   4.1cm on CT scan  . Anxiety   . Arthritis 11/08/2011   "feet and knees"  . Back pain, chronic   . Bipolar 1 disorder (HCC)   . Burning with urination 11/08/2011  . Chronic bronchitis (HCC) 11/08/2011   "get it q month"  . Daily headache 11/08/2011  . Depression   . GERD (gastroesophageal reflux disease)   . Hepatitis 11/08/2011   "not sure which one"  . History of stomach ulcers 11/08/2011  . Homelessness   . Hypertension   . Mental disorder 11/08/2011   "I get crazy; I take medicine"  . Myocardial infarction    "think so; when I was young; used to go to heart dr"  . Pneumonia   . Schizo-affective schizophrenia (HCC)   . Seizures (HCC) 11/08/2011   "jerking kind"  . Shortness of breath 11/08/2011   "lying down"  . Stroke (HCC) 11/08/2011   points to right chest    Patient Active Problem List   Diagnosis Date Noted  . Chronic pancreatitis (HCC) 11/14/2011  . Cannabis abuse 11/09/2011  . Hepatitis  11/08/2011  . Hyperglycemia 11/08/2011  . Alcohol abuse 11/08/2011  . Transaminasemia 11/08/2011  . Elevated lipase 11/08/2011  . Chronic back pain 02/07/2011  . Insomnia 02/07/2011    Past Surgical History:  Procedure Laterality Date  . CHOLECYSTECTOMY  2008  . EUS  11/14/2011   Procedure: FULL UPPER ENDOSCOPIC ULTRASOUND (EUS) RADIAL;  Surgeon: Willis Modena, MD;  Location: [facility] ENDOS[facility];  Service: Endoscopy;  Laterality: N/A;  to be carelinked from [facility]       Home Medications    Prior to Admission medications   Medication Sig Start Date End Date Taking? Authorizing Provider  HYDROcodone-acetaminophen (NORCO/VICODIN) 5-325 MG per tablet Take 1 tablet by mouth every 6 (six) hours as needed for moderate pain. Patient not taking: Reported on 12/27/2015 05/11/14   Charlestine Night, PA-C  HYDROcodone-acetaminophen (NORCO/VICODIN) 5-325 MG tablet Take 2 tablets by mouth every 4 (four) hours as needed. Patient not taking: Reported on 12/27/2015 12/21/15   Elson Areas, PA-C  ibuprofen (ADVIL,MOTRIN) 600 MG tablet Take 1 tablet (600 mg total) by mouth every 6 (six) hours as needed. Patient not taking: Reported on 12/27/2015 12/21/15   Elson Areas, PA-C  ibuprofen (ADVIL,MOTRIN) 800 MG tablet Take 1 tablet (800 mg total) by mouth every 8 (eight) hours as needed. Patient not taking: Reported on 12/27/2015 05/11/14   Charlestine Night, PA-C  traMADol (ULTRAM) 50 MG tablet Take  1 tablet (50 mg total) by mouth every 6 (six) hours as needed. Patient not taking: Reported on 12/27/2015 12/19/15   Maia PlanJoshua G Long, MD    Family History Family History  Problem Relation Age of Onset  . Hypertension Mother     Social History Social History  Substance Use Topics  . Smoking status: Current Every Day Smoker    Packs/day: 0.50    Years: 29.00    Types: Cigarettes  . Smokeless tobacco: Never Used  . Alcohol use Yes     Comment: 11/08/2011 "4-5 bottles beer/wk"     Allergies   Review of  patient's allergies indicates no known allergies.   Review of Systems Review of Systems  All other systems reviewed and are negative.  10 Systems reviewed and are negative for acute change except as noted in the HPI.   Physical Exam Updated Vital Signs BP 126/85 (BP Location: Left Arm)   Pulse 82   Temp 98 F (36.7 C) (Oral)   Resp 18   Ht 5\' 4"  (1.626 m)   Wt 147 lb (66.7 kg)   SpO2 94%   BMI 25.23 kg/m   Physical Exam  Constitutional: He is oriented to person, place, and time. He appears well-developed and well-nourished. No distress.  HENT:  Head: Normocephalic and atraumatic.  Right Ear: External ear normal.  Left Ear: External ear normal.  Nose: Nose normal.  Mouth/Throat: Oropharynx is clear and moist. No oropharyngeal exudate.  Eyes: Conjunctivae and EOM are normal. Pupils are equal, round, and reactive to light. Right eye exhibits no discharge. Left eye exhibits no discharge. No scleral icterus.  Neck: Normal range of motion. Neck supple. No JVD present. No tracheal deviation present.  Cardiovascular: Normal rate, regular rhythm, normal heart sounds and intact distal pulses.  Exam reveals no gallop and no friction rub.   No murmur heard. Pulmonary/Chest: Effort normal and breath sounds normal. No stridor. No respiratory distress. He has no wheezes.  Abdominal: Soft. Bowel sounds are normal. He exhibits no distension. There is no tenderness.  Musculoskeletal: Normal range of motion. He exhibits tenderness. He exhibits no edema.  Left lumbar paraspinal muscle ttp No midline tenderness, no stepoff from cervical to lumbar spine  Lymphadenopathy:    He has no cervical adenopathy.  Neurological: He is alert and oriented to person, place, and time. He exhibits normal muscle tone. Coordination normal.  Normal sensation to light touch in all extremities Normal strength bilaterally dorsiflexion, plantarflexion, flexion at hips Normal gait   Skin: Skin is warm and dry. No  rash noted. He is not diaphoretic. No erythema. No pallor.  Psychiatric: He has a normal mood and affect. His behavior is normal. Judgment and thought content normal.  Nursing note and vitals reviewed.    ED Treatments / Results  DIAGNOSTIC STUDIES:  Oxygen Saturation is 94% on RA, low by my interpretation.    COORDINATION OF CARE:  12:54 AM Discussed treatment plan with pt at bedside and pt agreed to plan.  Labs (all labs ordered are listed, but only abnormal results are displayed) Labs Reviewed - No data to display  EKG  EKG Interpretation None       Radiology No results found.  Procedures Procedures (including critical care time)  Medications Ordered in ED Medications - No data to display   Initial Impression / Assessment and Plan / ED Course  I have reviewed the triage vital signs and the nursing notes.  Pertinent labs & imaging results that  were available during my care of the patient were reviewed by me and considered in my medical decision making (see chart for details).  Clinical Course   Patient with back pain.  No neurological deficits and normal neuro exam.  Patient can walk but states is painful.  No loss of bowel or bladder control.  No concern for cauda equina.  No fever, night sweats, weight loss, h/o cancer, IVDU.  RICE protocol and pain medicine indicated and discussed with patient.    Final Clinical Impressions(s) / ED Diagnoses   Final diagnoses:  Left-sided low back pain without sciatica, unspecified chronicity    New Prescriptions Discharge Medication List as of 01/02/2016  3:12 AM    START taking these medications   Details  cyclobenzaprine (FLEXERIL) 10 MG tablet Take 1 tablet (10 mg total) by mouth 2 (two) times daily as needed for muscle spasms., Starting Sun 01/02/2016, Print       I personally performed the services described in this documentation, which was scribed in my presence. The recorded information has been reviewed and  is accurate.       Danelle Berry, PA-C 01/22/16 1756    Derwood Kaplan, MD 01/26/16 1512

## 2016-01-03 ENCOUNTER — Encounter (HOSPITAL_COMMUNITY): Payer: Self-pay

## 2016-01-03 ENCOUNTER — Emergency Department (HOSPITAL_COMMUNITY): Payer: No Typology Code available for payment source

## 2016-01-03 ENCOUNTER — Emergency Department (HOSPITAL_COMMUNITY)
Admission: EM | Admit: 2016-01-03 | Discharge: 2016-01-03 | Disposition: A | Discharge status: Home / Self Care | Payer: No Typology Code available for payment source | Attending: Emergency Medicine | Admitting: Emergency Medicine

## 2016-01-03 DIAGNOSIS — M546 Pain in thoracic spine: Secondary | ICD-10-CM

## 2016-01-03 DIAGNOSIS — Z79899 Other long term (current) drug therapy: Secondary | ICD-10-CM | POA: Insufficient documentation

## 2016-01-03 DIAGNOSIS — F17210 Nicotine dependence, cigarettes, uncomplicated: Secondary | ICD-10-CM | POA: Diagnosis not present

## 2016-01-03 DIAGNOSIS — I10 Essential (primary) hypertension: Secondary | ICD-10-CM | POA: Insufficient documentation

## 2016-01-03 DIAGNOSIS — Z8673 Personal history of transient ischemic attack (TIA), and cerebral infarction without residual deficits: Secondary | ICD-10-CM | POA: Insufficient documentation

## 2016-01-03 DIAGNOSIS — I252 Old myocardial infarction: Secondary | ICD-10-CM | POA: Insufficient documentation

## 2016-01-03 LAB — URINALYSIS, ROUTINE W REFLEX MICROSCOPIC
Bilirubin Urine: NEGATIVE
GLUCOSE, UA: NEGATIVE mg/dL
HGB URINE DIPSTICK: NEGATIVE
Ketones, ur: NEGATIVE mg/dL
LEUKOCYTES UA: NEGATIVE
Nitrite: NEGATIVE
PH: 6.5 (ref 5.0–8.0)
PROTEIN: NEGATIVE mg/dL
Specific Gravity, Urine: 1.015 (ref 1.005–1.030)

## 2016-01-03 MED ORDER — OXYCODONE-ACETAMINOPHEN 5-325 MG PO TABS: 2.0000 | ORAL_TABLET | Freq: Four times a day (QID) | ORAL | 0 refills | Status: DC | PRN

## 2016-01-03 MED ORDER — CYCLOBENZAPRINE HCL 10 MG PO TABS: 10.0000 mg | ORAL_TABLET | Freq: Two times a day (BID) | ORAL | 0 refills | Status: DC | PRN

## 2016-01-03 MED ORDER — CYCLOBENZAPRINE HCL 10 MG PO TABS
10.0000 mg | ORAL_TABLET | Freq: Once | ORAL | Status: AC
  Administered 2016-01-03: 10 mg via ORAL
  Filled 2016-01-03: qty 1

## 2016-01-03 MED ORDER — HYDROCODONE-ACETAMINOPHEN 5-325 MG PO TABS
1.0000 | ORAL_TABLET | Freq: Once | ORAL | Status: AC
  Administered 2016-01-03: 1 via ORAL
  Filled 2016-01-03: qty 1

## 2016-01-03 NOTE — ED Notes (Signed)
Pt ambulated to restroom from room, tolerated well. 
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
## 2016-01-03 NOTE — ED Notes (Signed)
Pt ambulated to and from restroom without difficulty.   

## 2016-01-03 NOTE — ED Notes (Signed)
Patient transported to X-ray 

## 2016-01-06 ENCOUNTER — Emergency Department (HOSPITAL_COMMUNITY)
Admission: EM | Admit: 2016-01-06 | Discharge: 2016-01-06 | Disposition: A | Discharge status: Home / Self Care | Payer: No Typology Code available for payment source

## 2016-01-08 ENCOUNTER — Encounter (HOSPITAL_COMMUNITY): Payer: Self-pay | Admitting: Emergency Medicine

## 2016-01-08 ENCOUNTER — Emergency Department (HOSPITAL_COMMUNITY)
Admission: EM | Admit: 2016-01-08 | Discharge: 2016-01-08 | Disposition: A | Discharge status: Home / Self Care | Payer: No Typology Code available for payment source | Attending: Emergency Medicine | Admitting: Emergency Medicine

## 2016-01-08 DIAGNOSIS — I252 Old myocardial infarction: Secondary | ICD-10-CM | POA: Diagnosis not present

## 2016-01-08 DIAGNOSIS — I10 Essential (primary) hypertension: Secondary | ICD-10-CM | POA: Insufficient documentation

## 2016-01-08 DIAGNOSIS — F17210 Nicotine dependence, cigarettes, uncomplicated: Secondary | ICD-10-CM | POA: Diagnosis not present

## 2016-01-08 DIAGNOSIS — S29002A Unspecified injury of muscle and tendon of back wall of thorax, initial encounter: Secondary | ICD-10-CM | POA: Diagnosis not present

## 2016-01-08 DIAGNOSIS — Z8673 Personal history of transient ischemic attack (TIA), and cerebral infarction without residual deficits: Secondary | ICD-10-CM | POA: Diagnosis not present

## 2016-01-08 DIAGNOSIS — Y999 Unspecified external cause status: Secondary | ICD-10-CM | POA: Diagnosis not present

## 2016-01-08 DIAGNOSIS — M546 Pain in thoracic spine: Secondary | ICD-10-CM

## 2016-01-08 DIAGNOSIS — Y92410 Unspecified street and highway as the place of occurrence of the external cause: Secondary | ICD-10-CM | POA: Diagnosis not present

## 2016-01-08 DIAGNOSIS — Y939 Activity, unspecified: Secondary | ICD-10-CM | POA: Diagnosis not present

## 2016-01-08 MED ORDER — HYDROCODONE-ACETAMINOPHEN 5-325 MG PO TABS
1.0000 | ORAL_TABLET | Freq: Once | ORAL | Status: AC
  Administered 2016-01-08: 1 via ORAL
  Filled 2016-01-08: qty 1

## 2016-01-08 MED ORDER — METHOCARBAMOL 500 MG PO TABS
500.0000 mg | ORAL_TABLET | Freq: Once | ORAL | Status: AC
  Administered 2016-01-08: 500 mg via ORAL
  Filled 2016-01-08: qty 1

## 2016-01-08 NOTE — Discharge Instructions (Signed)
1. Medications: fill the medications prescribed last week, usual home medications 2. Treatment: rest, drink plenty of fluids, gentle stretching as discussed, alternate ice and heat 3. Follow Up: Please followup with your primary doctor in 3-5 days for discussion of your diagnoses and further evaluation after today's visit; if you do not have a primary care doctor use the resource guide provided to find one;  Return to the ER for worsening back pain, difficulty walking, loss of bowel or bladder control or other concerning symptoms
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
## 2016-01-09 DIAGNOSIS — Z8673 Personal history of transient ischemic attack (TIA), and cerebral infarction without residual deficits: Secondary | ICD-10-CM | POA: Insufficient documentation

## 2016-01-09 DIAGNOSIS — M549 Dorsalgia, unspecified: Secondary | ICD-10-CM | POA: Insufficient documentation

## 2016-01-09 DIAGNOSIS — I252 Old myocardial infarction: Secondary | ICD-10-CM | POA: Insufficient documentation

## 2016-01-09 DIAGNOSIS — I10 Essential (primary) hypertension: Secondary | ICD-10-CM | POA: Insufficient documentation

## 2016-01-09 DIAGNOSIS — Y939 Activity, unspecified: Secondary | ICD-10-CM | POA: Insufficient documentation

## 2016-01-09 DIAGNOSIS — Y92410 Unspecified street and highway as the place of occurrence of the external cause: Secondary | ICD-10-CM | POA: Insufficient documentation

## 2016-01-09 DIAGNOSIS — Z5321 Procedure and treatment not carried out due to patient leaving prior to being seen by health care provider: Secondary | ICD-10-CM | POA: Insufficient documentation

## 2016-01-09 DIAGNOSIS — Y999 Unspecified external cause status: Secondary | ICD-10-CM | POA: Insufficient documentation

## 2016-01-09 DIAGNOSIS — F17210 Nicotine dependence, cigarettes, uncomplicated: Secondary | ICD-10-CM | POA: Insufficient documentation
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
## 2016-01-10 ENCOUNTER — Emergency Department (HOSPITAL_COMMUNITY)
Admission: EM | Admit: 2016-01-10 | Discharge: 2016-01-10 | Disposition: A | Discharge status: Home / Self Care | Payer: Medicaid Other | Attending: Dermatology | Admitting: Dermatology

## 2016-05-07 ENCOUNTER — Emergency Department (HOSPITAL_COMMUNITY)
Admission: EM | Admit: 2016-05-07 | Discharge: 2016-05-07 | Disposition: A | Discharge status: Home / Self Care | Payer: Medicaid Other | Attending: Emergency Medicine | Admitting: Emergency Medicine

## 2016-05-07 ENCOUNTER — Encounter (HOSPITAL_COMMUNITY): Payer: Self-pay | Admitting: Emergency Medicine

## 2016-05-07 DIAGNOSIS — G8929 Other chronic pain: Secondary | ICD-10-CM | POA: Insufficient documentation

## 2016-05-07 DIAGNOSIS — R109 Unspecified abdominal pain: Secondary | ICD-10-CM | POA: Insufficient documentation

## 2016-05-07 DIAGNOSIS — I10 Essential (primary) hypertension: Secondary | ICD-10-CM | POA: Insufficient documentation

## 2016-05-07 DIAGNOSIS — I252 Old myocardial infarction: Secondary | ICD-10-CM | POA: Insufficient documentation

## 2016-05-07 DIAGNOSIS — F17210 Nicotine dependence, cigarettes, uncomplicated: Secondary | ICD-10-CM | POA: Insufficient documentation

## 2016-05-07 DIAGNOSIS — Z8673 Personal history of transient ischemic attack (TIA), and cerebral infarction without residual deficits: Secondary | ICD-10-CM | POA: Insufficient documentation

## 2016-05-07 DIAGNOSIS — Z79899 Other long term (current) drug therapy: Secondary | ICD-10-CM | POA: Insufficient documentation

## 2016-05-07 NOTE — ED Notes (Signed)
Give pt 2-cokes as requested

## 2016-05-07 NOTE — ED Notes (Signed)
Placed pt on 3L [place], O2 sats upper 80's while pt asleep
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
## 2016-05-07 NOTE — ED Notes (Signed)
Pt given [place] sandwich and drink, states he has not ate in 3 days bc he is homeless.

## 2016-05-07 NOTE — ED Notes (Signed)
Pt here several times in past year for same reason, chronic pain issues. Pt is homeless, ETOH use, psych hx.

## 2016-05-07 NOTE — ED Notes (Signed)
EDP at bedside  

## 2016-05-07 NOTE — Discharge Instructions (Signed)
I recommend calling the clinic listed below for follow up evaluation and further management of your chronic pain.

## 2016-05-07 NOTE — ED Triage Notes (Signed)
BIB EMS, reporting "pain all over" Pt poor historian.

## 2016-05-09 ENCOUNTER — Emergency Department (HOSPITAL_COMMUNITY)
Admission: EM | Admit: 2016-05-09 | Discharge: 2016-05-09 | Disposition: A | Discharge status: Home / Self Care | Payer: Medicaid Other | Attending: Emergency Medicine | Admitting: Emergency Medicine

## 2016-05-09 ENCOUNTER — Encounter (HOSPITAL_COMMUNITY): Payer: Self-pay

## 2016-05-09 DIAGNOSIS — G8929 Other chronic pain: Secondary | ICD-10-CM

## 2016-05-09 DIAGNOSIS — F17210 Nicotine dependence, cigarettes, uncomplicated: Secondary | ICD-10-CM | POA: Insufficient documentation

## 2016-05-09 DIAGNOSIS — Z8673 Personal history of transient ischemic attack (TIA), and cerebral infarction without residual deficits: Secondary | ICD-10-CM | POA: Insufficient documentation

## 2016-05-09 DIAGNOSIS — M545 Low back pain, unspecified: Secondary | ICD-10-CM

## 2016-05-09 DIAGNOSIS — I10 Essential (primary) hypertension: Secondary | ICD-10-CM | POA: Insufficient documentation

## 2016-05-09 MED ORDER — KETOROLAC TROMETHAMINE 15 MG/ML IJ SOLN
15.0000 mg | Freq: Once | INTRAMUSCULAR | Status: AC
  Administered 2016-05-09: 15 mg via INTRAMUSCULAR
  Filled 2016-05-09: qty 1

## 2016-05-09 MED ORDER — DIAZEPAM 2 MG PO TABS
2.0000 mg | ORAL_TABLET | Freq: Once | ORAL | Status: AC
  Administered 2016-05-09: 2 mg via ORAL
  Filled 2016-05-09: qty 1

## 2016-05-09 NOTE — ED Triage Notes (Signed)
Patient arrived by EMS for chronic back pain since October. Seen Saturday for same. States I have to have pain meds cannot sleep, NAD

## 2016-05-25 NOTE — ED Provider Notes (Signed)
[facility]ERG[facility] DEPT Provider Note   CSN: [id number] Arrival date & time: 05/09/16  0706     History   Chief Complaint Chief Complaint  Patient presents with  . Back Pain    HPI Dustin Hansen is a 46 y.o. male.  HPI   [name] with back pain. Chronic since around October. He denies acute injury. No acute numbness, tingling or focal loss of strength. No urinary complaints. Has not taken anything for it. Is requesting pain medication and something to eat.   Past Medical History:  Diagnosis Date  . AAA (abdominal aortic aneurysm) (HCC) 10/2010   4.1cm on CT scan  . Anxiety   . Arthritis 11/08/2011   "feet and knees"  . Back pain, chronic   . Bipolar 1 disorder (HCC)   . Burning with urination 11/08/2011  . Chronic bronchitis (HCC) 11/08/2011   "get it q month"  . Daily headache 11/08/2011  . Depression   . GERD (gastroesophageal reflux disease)   . Hepatitis 11/08/2011   "not sure which one"  . History of stomach ulcers 11/08/2011  . Homelessness   . Hypertension   . Mental disorder 11/08/2011   "I get crazy; I take medicine"  . Myocardial infarction    "think so; when I was young; used to go to heart dr"  . Pneumonia   . Schizo-affective schizophrenia (HCC)   . Seizures (HCC) 11/08/2011   "jerking kind"  . Shortness of breath 11/08/2011   "lying down"  . Stroke (HCC) 11/08/2011   points to right chest    Patient Active Problem List   Diagnosis Date Noted  . Chronic pancreatitis (HCC) 11/14/2011  . Cannabis abuse 11/09/2011  . Hepatitis 11/08/2011  . Hyperglycemia 11/08/2011  . Alcohol abuse 11/08/2011  . Transaminasemia 11/08/2011  . Elevated lipase 11/08/2011  . Chronic back pain 02/07/2011  . Insomnia 02/07/2011    Past Surgical History:  Procedure Laterality Date  . CHOLECYSTECTOMY  2008  . EUS  11/14/2011   Procedure: FULL UPPER ENDOSCOPIC ULTRASOUND (EUS) RADIAL;  Surgeon: Willis Modena, MD;  Location: [facility];  Service: Endoscopy;  Laterality: N/A;  to be  [facility]linked from [facility]       Home Medications    Prior to Admission medications   Medication Sig Start Date End Date Taking? Authorizing Provider  cyclobenzaprine (FLEXERIL) 10 MG tablet Take 1 tablet (10 mg total) by mouth 2 (two) times daily as needed for muscle spasms. 01/03/16   Canary Brim Tegeler, MD  oxyCODONE-acetaminophen (PERCOCET/ROXICET) 5-325 MG tablet Take 2 tablets by mouth every 6 (six) hours as needed for severe pain. 01/03/16   Canary Brim Tegeler, MD  traMADol (ULTRAM) 50 MG tablet Take 1 tablet (50 mg total) by mouth every 12 (twelve) hours as needed for severe pain. 01/02/16   Danelle Berry, PA-C    Family History Family History  Problem Relation Age of Onset  . Hypertension Mother     Social History Social History  Substance Use Topics  . Smoking status: Current Every Day Smoker    Packs/day: 0.50    Years: 29.00    Types: Cigarettes  . Smokeless tobacco: Never Used  . Alcohol use Yes     Comment: 11/08/2011 "4-5 bottles beer/wk"     Allergies   Patient has no known allergies.   Review of Systems Review of Systems  All systems reviewed and negative, other than as noted in HPI.   Physical Exam Updated Vital Signs BP 105/85 (BP Location: Right  Arm)   Pulse 87   Temp 97.9 F (36.6 C) (Oral)   Resp 18   SpO2 97%   Physical Exam  Constitutional: He appears well-developed and well-nourished. No distress.  HENT:  Head: Normocephalic and atraumatic.  Eyes: Conjunctivae are normal. Right eye exhibits no discharge. Left eye exhibits no discharge.  Neck: Neck supple.  Cardiovascular: Normal rate, regular rhythm and normal heart sounds.  Exam reveals no gallop and no friction rub.   No murmur heard. Pulmonary/Chest: Effort normal and breath sounds normal. No respiratory distress.  Abdominal: Soft. He exhibits no distension. There is no tenderness.  Musculoskeletal: He exhibits no edema or tenderness.  Neurological: He is alert.  Skin: Skin is  warm and dry.  Psychiatric: He has a normal mood and affect. His behavior is normal. Thought content normal.  Nursing note and vitals reviewed.    ED Treatments / Results  Labs (all labs ordered are listed, but only abnormal results are displayed) Labs Reviewed - No data to display  EKG  EKG Interpretation None       Radiology No results found.  Procedures Procedures (including critical care time)  Medications Ordered in ED Medications  ketorolac (TORADOL) 15 MG/ML injection 15 mg (15 mg Intramuscular Given 05/09/16 0814)  diazepam (VALIUM) tablet 2 mg (2 mg Oral Given 05/09/16 [id number])     Initial Impression / Assessment and Plan / ED Course  I have reviewed the triage vital signs and the nursing notes.  Pertinent labs & imaging results that were available during my care of the patient were reviewed by me and considered in my medical decision making (see chart for details).      Final Clinical Impressions(s) / ED Diagnoses   Final diagnoses:  Chronic low back pain without sciatica, unspecified back pain laterality    New Prescriptions Discharge Medication List as of 05/09/2016  8:18 AM       Raeford RazorStephen Nitish Roes, MD 05/25/16 [phone]

## 2016-06-07 ENCOUNTER — Encounter (HOSPITAL_COMMUNITY): Payer: Self-pay | Admitting: *Deleted

## 2016-06-07 ENCOUNTER — Emergency Department (HOSPITAL_COMMUNITY)
Admission: EM | Admit: 2016-06-07 | Discharge: 2016-06-07 | Disposition: A | Discharge status: Home / Self Care | Payer: Self-pay | Attending: Emergency Medicine | Admitting: Emergency Medicine

## 2016-06-07 ENCOUNTER — Emergency Department (HOSPITAL_COMMUNITY): Payer: Self-pay

## 2016-06-07 DIAGNOSIS — Z79899 Other long term (current) drug therapy: Secondary | ICD-10-CM | POA: Insufficient documentation

## 2016-06-07 DIAGNOSIS — X501XXA Overexertion from prolonged static or awkward postures, initial encounter: Secondary | ICD-10-CM | POA: Insufficient documentation

## 2016-06-07 DIAGNOSIS — Y990 Civilian activity done for income or pay: Secondary | ICD-10-CM | POA: Insufficient documentation

## 2016-06-07 DIAGNOSIS — I252 Old myocardial infarction: Secondary | ICD-10-CM | POA: Insufficient documentation

## 2016-06-07 DIAGNOSIS — G8929 Other chronic pain: Secondary | ICD-10-CM | POA: Insufficient documentation

## 2016-06-07 DIAGNOSIS — Z8673 Personal history of transient ischemic attack (TIA), and cerebral infarction without residual deficits: Secondary | ICD-10-CM | POA: Insufficient documentation

## 2016-06-07 DIAGNOSIS — Y929 Unspecified place or not applicable: Secondary | ICD-10-CM | POA: Insufficient documentation

## 2016-06-07 DIAGNOSIS — M5441 Lumbago with sciatica, right side: Secondary | ICD-10-CM | POA: Insufficient documentation

## 2016-06-07 DIAGNOSIS — F17210 Nicotine dependence, cigarettes, uncomplicated: Secondary | ICD-10-CM | POA: Insufficient documentation

## 2016-06-07 DIAGNOSIS — M5442 Lumbago with sciatica, left side: Secondary | ICD-10-CM | POA: Insufficient documentation

## 2016-06-07 DIAGNOSIS — I10 Essential (primary) hypertension: Secondary | ICD-10-CM | POA: Insufficient documentation

## 2016-06-07 DIAGNOSIS — Y939 Activity, unspecified: Secondary | ICD-10-CM | POA: Insufficient documentation

## 2016-06-07 LAB — URINALYSIS, ROUTINE W REFLEX MICROSCOPIC
Bilirubin Urine: NEGATIVE
GLUCOSE, UA: NEGATIVE mg/dL
HGB URINE DIPSTICK: NEGATIVE
Ketones, ur: NEGATIVE mg/dL
Leukocytes, UA: NEGATIVE
Nitrite: NEGATIVE
Protein, ur: NEGATIVE mg/dL
SPECIFIC GRAVITY, URINE: 1.002 — AB (ref 1.005–1.030)
pH: 7 (ref 5.0–8.0)

## 2016-06-07 MED ORDER — IBUPROFEN 200 MG PO TABS
600.0000 mg | ORAL_TABLET | Freq: Once | ORAL | Status: AC
  Administered 2016-06-07: 600 mg via ORAL
  Filled 2016-06-07: qty 1

## 2016-06-07 MED ORDER — NAPROXEN 500 MG PO TABS: 500.0000 mg | ORAL_TABLET | Freq: Two times a day (BID) | ORAL | 0 refills | Status: DC

## 2016-06-07 MED ORDER — METHOCARBAMOL 500 MG PO TABS: 500.0000 mg | ORAL_TABLET | Freq: Four times a day (QID) | ORAL | 0 refills | Status: DC | PRN

## 2016-06-07 MED ORDER — METHOCARBAMOL 500 MG PO TABS
500.0000 mg | ORAL_TABLET | Freq: Once | ORAL | Status: AC
  Administered 2016-06-07: 500 mg via ORAL
  Filled 2016-06-07: qty 1

## 2016-06-07 MED ORDER — KETOROLAC TROMETHAMINE 60 MG/2ML IM SOLN
60.0000 mg | Freq: Once | INTRAMUSCULAR | Status: AC
  Administered 2016-06-07: 60 mg via INTRAMUSCULAR
  Filled 2016-06-07: qty 2

## 2016-06-07 MED ORDER — ACETAMINOPHEN 500 MG PO TABS
1000.0000 mg | ORAL_TABLET | Freq: Once | ORAL | Status: AC
  Administered 2016-06-07: 1000 mg via ORAL
  Filled 2016-06-07: qty 2

## 2016-06-07 NOTE — ED Provider Notes (Signed)
3:05 PM patient is alert and in no distress ambulates without difficulty. X-rays viewed by me   Doug SouSam Rosaelena Kemnitz, MD 06/07/16 [phone]

## 2016-06-07 NOTE — ED Triage Notes (Signed)
Pt reports L sided lower back pain onset this week, per EMS pt reports bending over to pick up wood & heard a pop, pt ambulatory, pt denies bowel & bladder incontinence, A&O x4, pt homeless

## 2016-06-07 NOTE — Discharge Instructions (Addendum)
Your x-rays were negative today. [facility] to ref[facility] your prescriptions for a muscle relaxer and anti-inflammatory medicine. You may establish care with a primary care provider for regular medical care at this clinic as well.

## 2016-06-07 NOTE — ED Notes (Signed)
Patient transported to X-ray 

## 2016-06-07 NOTE — ED Notes (Signed)
Pt taken to xray at this time.

## 2016-06-07 NOTE — ED Provider Notes (Signed)
[facility] DEPT Provider Note   CSN: [id number] Arrival date & time: 06/07/16  1119     History   Chief Complaint Chief Complaint  Patient presents with  . Back Pain    HPI Dustin Hansen is a 46 y.o. male with pertinent pmh of chronic back pain, known compression deformities at L2-L4, AAA (4.2 cm on CT scan 2012), hypertension, homelessness, medical non compliance presents to ED with acute on chronic lumbar spine pain.  Patient was working (yard work, pulling a tree out of the ground) when he heard a pop in his back followed by a sudden, severe, low back pain two days ago.  Patient states his back feels "numb" but hurts.  Aggravating factors include direct palpation, truncal movement, flexion/extension of spine and coughing.  No alleviating factors.  Patient has not tried anything for pain, states he does not have money to buy medicine. No associated fevers, no IVDU, b/b incontinence or retention, groin numbness, focal n/w/t to lower extremities, n/v/d/c.  HPI  Past Medical History:  Diagnosis Date  . AAA (abdominal aortic aneurysm) (HCC) 10/2010   4.1cm on CT scan  . Anxiety   . Arthritis 11/08/2011   "feet and knees"  . Back pain, chronic   . Bipolar 1 disorder (HCC)   . Burning with urination 11/08/2011  . Chronic bronchitis (HCC) 11/08/2011   "get it q month"  . Daily headache 11/08/2011  . Depression   . GERD (gastroesophageal reflux disease)   . Hepatitis 11/08/2011   "not sure which one"  . History of stomach ulcers 11/08/2011  . Homelessness   . Hypertension   . Mental disorder 11/08/2011   "I get crazy; I take medicine"  . Myocardial infarction    "think so; when I was young; used to go to heart dr"  . Pneumonia   . Schizo-affective schizophrenia (HCC)   . Seizures (HCC) 11/08/2011   "jerking kind"  . Shortness of breath 11/08/2011   "lying down"  . Stroke (HCC) 11/08/2011   points to right chest    Patient Active Problem List   Diagnosis Date Noted  . Chronic  pancreatitis (HCC) 11/14/2011  . Cannabis abuse 11/09/2011  . Hepatitis 11/08/2011  . Hyperglycemia 11/08/2011  . Alcohol abuse 11/08/2011  . Transaminasemia 11/08/2011  . Elevated lipase 11/08/2011  . Chronic back pain 02/07/2011  . Insomnia 02/07/2011    Past Surgical History:  Procedure Laterality Date  . CHOLECYSTECTOMY  2008  . EUS  11/14/2011   Procedure: FULL UPPER ENDOSCOPIC ULTRASOUND (EUS) RADIAL;  Surgeon: Willis Modena, MD;  Location: [facility];  Service: Endoscopy;  Laterality: N/A;  to be carelinked from [facility]       Home Medications    Prior to Admission medications   Medication Sig Start Date End Date Taking? Authorizing Provider  cyclobenzaprine (FLEXERIL) 10 MG tablet Take 1 tablet (10 mg total) by mouth 2 (two) times daily as needed for muscle spasms. 01/03/16   Canary Brim Tegeler, MD  methocarbamol (ROBAXIN) 500 MG tablet Take 1 tablet (500 mg total) by mouth every 6 (six) hours as needed for muscle spasms. 06/07/16   Liberty Handy, PA-C  naproxen (NAPROSYN) 500 MG tablet Take 1 tablet (500 mg total) by mouth 2 (two) times daily. 06/07/16   Liberty Handy, PA-C  oxyCODONE-acetaminophen (PERCOCET/ROXICET) 5-325 MG tablet Take 2 tablets by mouth every 6 (six) hours as needed for severe pain. 01/03/16   Canary Brim Tegeler, MD  traMADol (ULTRAM) 50 MG  tablet Take 1 tablet (50 mg total) by mouth every 12 (twelve) hours as needed for severe pain. 01/02/16   Danelle Berry, PA-C    Family History Family History  Problem Relation Age of Onset  . Hypertension Mother     Social History Social History  Substance Use Topics  . Smoking status: Current Every Day Smoker    Packs/day: 0.50    Years: 29.00    Types: Cigarettes  . Smokeless tobacco: Never Used  . Alcohol use Yes     Comment: 11/08/2011 "4-5 bottles beer/wk"     Allergies   Patient has no known allergies.   Review of Systems Review of Systems  Constitutional: Negative for chills,  diaphoresis and fever.  Eyes: Negative for photophobia and visual disturbance.  Respiratory: Negative for cough, chest tightness and shortness of breath.   Cardiovascular: Negative for chest pain and leg swelling.  Gastrointestinal: Negative for abdominal pain, blood in stool, constipation, diarrhea, nausea and vomiting.  Genitourinary: Negative for difficulty urinating, dysuria, flank pain and hematuria.  Musculoskeletal: Positive for back pain and gait problem. Negative for arthralgias, myalgias and neck pain.  Skin: Negative for wound.  Neurological: Negative for facial asymmetry, weakness and numbness.     Physical Exam Updated Vital Signs BP 127/79 (BP Location: Right Arm)   Pulse 66   Temp 98.2 F (36.8 C) (Oral)   Resp 16   SpO2 99%   Physical Exam  Constitutional: He is oriented to person, place, and time. He appears well-developed and well-nourished. No distress.  HENT:  Head: Normocephalic and atraumatic.  Right Ear: External ear normal.  Left Ear: External ear normal.  Nose: Nose normal.  Mouth/Throat: Oropharynx is clear and moist. No oropharyngeal exudate.  Eyes: Conjunctivae and EOM are normal. Pupils are equal, round, and reactive to light. No scleral icterus.  Neck: Normal range of motion. Neck supple.  Cardiovascular: Normal rate, regular rhythm, normal heart sounds and intact distal pulses.   No murmur heard. Pulmonary/Chest: Effort normal and breath sounds normal. He has no wheezes.  Abdominal: Soft. He exhibits no distension. There is no tenderness.  No surgical abdominal scars noted. No pulsating masses.  + Bowel sounds throughout.  Abdomen is soft, non tender without distention, rigidity, guarding or rebound.  No suprapubic tenderness. No CVAT.  Negative Murphy's. Negative McBurney's.  Negative Psoas sign.  Non palpable kidneys. No hepatosplenomegaly.   Musculoskeletal: Normal range of motion. He exhibits tenderness. He exhibits no deformity.  Antalgic  gait favoring R side. Lumbar spine process tenderness with left sided paraspinal tenderness. Right SI joint and sciatic notch tender. Full active ROM of CTL spine including flexion, extension, lateral bend and rotation, patient reported pain with lumbar flexion, lateral bend and rotation. No midline CT spine tenderness or paraspinal tenderness.  Left SI jointsand sciatic notch non tender.  Full passive hip, knee and ankle ROM bilaterally.  Negative SLR and Faber bilaterally.  Lymphadenopathy:    He has no cervical adenopathy.  Neurological: He is alert and oriented to person, place, and time.  Skin: Skin is warm and dry. Capillary refill takes less than 2 seconds.  Psychiatric: He has a normal mood and affect. His behavior is normal. Judgment and thought content normal.  Nursing note and vitals reviewed.    ED Treatments / Results  Labs (all labs ordered are listed, but only abnormal results are displayed) Labs Reviewed  URINALYSIS, ROUTINE W REFLEX MICROSCOPIC - Abnormal; Notable for the following:  Result Value   Color, Urine COLORLESS (*)    Specific Gravity, Urine 1.002 (*)    All other components within normal limits    EKG  EKG Interpretation None       Radiology Dg Lumbar Spine Complete  Result Date: 06/07/2016 CLINICAL DATA:  Back injury while bending over to pick up object 2 days ago. Right-sided low back pain. Initial encounter. EXAM: LUMBAR SPINE - COMPLETE 4+ VIEW COMPARISON:  05/05/2015 FINDINGS: There is no evidence of lumbar spine fracture. Alignment is normal. Intervertebral disc spaces are maintained. No focal bone lesions identified. IMPRESSION: Negative lumbar spine radiographs. Electronically Signed   By: Myles Rosenthal M.D.   On: 06/07/2016 13:56    Procedures Procedures (including critical care time)  Medications Ordered in ED Medications  methocarbamol (ROBAXIN) tablet 500 mg (not administered)  acetaminophen (TYLENOL) tablet 1,000 mg (not  administered)  ibuprofen (ADVIL,MOTRIN) tablet 600 mg (not administered)  ketorolac (TORADOL) injection 60 mg (60 mg Intramuscular Given 06/07/16 1328)     Initial Impression / Assessment and Plan / ED Course  I have reviewed the triage vital signs and the nursing notes.  Pertinent labs & imaging results that were available during my care of the patient were reviewed by me and considered in my medical decision making (see chart for details).  Clinical Course as of Jun 08 1446  Wed Jun 07, 2016  1426 Nitrite: NEGATIVE [CG]  1426 Temp: 98.2 F (36.8 C) [CG]  1426 Pulse Rate: 67 [CG]  1426 BP: (!) 136/94 [CG]  1426 Resp: 16 [CG]  1426 SpO2: 99 % [CG]  1426 FINDINGS: There is no evidence of lumbar spine fracture. Alignment is normal. Intervertebral disc spaces are maintained. No focal bone lesions identified.  IMPRESSION: Negative lumbar spine radiographs. DG Lumbar Spine Complete [CG]    Clinical Course User Index [CG] Liberty Handy, PA-C   Patient with h/o chronic back pain presents with acute on chronic worsening right sided low back pain while working preceded by audible "pop".  No focal neurological findings.  No b/b incontinence.  DRE revealed good rectal tone.  There is lumbar spine tenderness over spinous processes, right sided lumbar paraspinal muscular tenderness.  Patient has full active ROM of lumbar spine and LEs.  I ambulated patient and he had antalgic gait without foot drop.  Abdominal exam reassuring.  Initial ddx includes psoas muscle strain or spasm and less likely ruptured disc, UTI/pyelo, kidney stone, cauda equina or epidural abscess.  No red flag symptoms of back pain including: fecal incontinence, urinary retention or overflow incontinence, night sweats, waking from sleep with back pain, unexplained fevers or weight loss, h/o cancer, IVDU, recent trauma. No concern for cauda equina, epidural abscess, or other serious cause of back pain.  U/A negative today.  Lumbar  films negative.  Suspect muscle spasm or strain.  Conservative measures such as ice/heat, mild stretches, muscle relaxant and tylenol.   Patient has been to the ED multiple times for lower back pain. He tells me he is homeless and does not have money to purchase his medications for pain control. Will contact social work and [facility]. Advised him to establish care there with a PCP.   Final Clinical Impressions(s) / ED Diagnoses   Final diagnoses:  Chronic right-sided low back pain with bilateral sciatica    New Prescriptions New Prescriptions   METHOCARBAMOL (ROBAXIN) 500 MG TABLET    Take 1 tablet (500 mg  total) by mouth every 6 (six) hours as needed for muscle spasms.   NAPROXEN (NAPROSYN) 500 MG TABLET    Take 1 tablet (500 mg total) by mouth 2 (two) times daily.     Liberty HandyClaudia J Khalin Royce, PA-C 06/07/16 1448    Doug SouSam Jacubowitz, MD 06/07/16 [phone]

## 2016-06-21 ENCOUNTER — Encounter (HOSPITAL_COMMUNITY): Payer: Self-pay | Admitting: *Deleted

## 2016-06-21 ENCOUNTER — Emergency Department (HOSPITAL_COMMUNITY)
Admission: EM | Admit: 2016-06-21 | Discharge: 2016-06-21 | Disposition: A | Discharge status: Home / Self Care | Payer: Medicaid Other | Attending: Physician Assistant | Admitting: Physician Assistant

## 2016-06-21 DIAGNOSIS — Z8673 Personal history of transient ischemic attack (TIA), and cerebral infarction without residual deficits: Secondary | ICD-10-CM | POA: Insufficient documentation

## 2016-06-21 DIAGNOSIS — K2920 Alcoholic gastritis without bleeding: Secondary | ICD-10-CM | POA: Insufficient documentation

## 2016-06-21 DIAGNOSIS — F17210 Nicotine dependence, cigarettes, uncomplicated: Secondary | ICD-10-CM | POA: Insufficient documentation

## 2016-06-21 DIAGNOSIS — I10 Essential (primary) hypertension: Secondary | ICD-10-CM | POA: Insufficient documentation

## 2016-06-21 DIAGNOSIS — I252 Old myocardial infarction: Secondary | ICD-10-CM | POA: Insufficient documentation

## 2016-06-21 DIAGNOSIS — Z79899 Other long term (current) drug therapy: Secondary | ICD-10-CM | POA: Insufficient documentation

## 2016-06-21 LAB — CBC
HCT: 48.9 % (ref 39.0–52.0)
HEMOGLOBIN: 17 g/dL (ref 13.0–17.0)
MCH: 29.5 pg (ref 26.0–34.0)
MCHC: 34.8 g/dL (ref 30.0–36.0)
MCV: 84.7 fL (ref 78.0–100.0)
PLATELETS: 167 10*3/uL (ref 150–400)
RBC: 5.77 MIL/uL (ref 4.22–5.81)
RDW: 14.4 % (ref 11.5–15.5)
WBC: 9.2 10*3/uL (ref 4.0–10.5)

## 2016-06-21 LAB — LIPASE, BLOOD: Lipase: 25 U/L (ref 11–51)

## 2016-06-21 LAB — COMPREHENSIVE METABOLIC PANEL
ALK PHOS: 71 U/L (ref 38–126)
ALT: 15 U/L — AB (ref 17–63)
ANION GAP: 11 (ref 5–15)
AST: 25 U/L (ref 15–41)
Albumin: 4.1 g/dL (ref 3.5–5.0)
BILIRUBIN TOTAL: 0.8 mg/dL (ref 0.3–1.2)
BUN: 7 mg/dL (ref 6–20)
CALCIUM: 8.6 mg/dL — AB (ref 8.9–10.3)
CO2: 25 mmol/L (ref 22–32)
CREATININE: 0.87 mg/dL (ref 0.61–1.24)
Chloride: 102 mmol/L (ref 101–111)
GFR calc non Af Amer: >60 mL/min (ref >60)
GLUCOSE: 86 mg/dL (ref 65–99)
Potassium: 3.8 mmol/L (ref 3.5–5.1)
Sodium: 138 mmol/L (ref 135–145)
TOTAL PROTEIN: 6.5 g/dL (ref 6.5–8.1)

## 2016-06-21 LAB — URINALYSIS, ROUTINE W REFLEX MICROSCOPIC
Bilirubin Urine: NEGATIVE
Glucose, UA: NEGATIVE mg/dL
Hgb urine dipstick: NEGATIVE
Ketones, ur: 5 mg/dL — AB
Leukocytes, UA: NEGATIVE
Nitrite: NEGATIVE
Protein, ur: NEGATIVE mg/dL
Specific Gravity, Urine: 1.021 (ref 1.005–1.030)
pH: 5 (ref 5.0–8.0)

## 2016-06-21 MED ORDER — SODIUM CHLORIDE 0.9 % IV SOLN
Freq: Once | INTRAVENOUS | Status: AC
  Administered 2016-06-21: 08:00:00 via INTRAVENOUS

## 2016-06-21 MED ORDER — FAMOTIDINE IN NACL 20-0.9 MG/50ML-% IV SOLN
20.0000 mg | Freq: Once | INTRAVENOUS | Status: AC
  Administered 2016-06-21: 20 mg via INTRAVENOUS
  Filled 2016-06-21: qty 50

## 2016-06-21 MED ORDER — GI COCKTAIL ~~LOC~~
30.0000 mL | Freq: Once | ORAL | Status: AC
  Administered 2016-06-21: 30 mL via ORAL
  Filled 2016-06-21: qty 30

## 2016-06-21 NOTE — ED Triage Notes (Signed)
Pt c/o abdominal pain with NV x 2 days.

## 2016-06-21 NOTE — ED Provider Notes (Signed)
[facility] Provider Note   CSN: [id number] Arrival date & time: 06/21/16  0419     History   Chief Complaint Chief Complaint  Patient presents with  . Abdominal Pain    HPI Dustin Hansen is a 46 y.o. male.  The history is provided by the patient. No language interpreter was used.  Abdominal Pain   This is a new problem. The current episode started 2 days ago. The problem occurs constantly. The problem has been gradually worsening. The pain is associated with alcohol use. Pertinent negatives include fever, headaches and myalgias. Nothing aggravates the symptoms. Nothing relieves the symptoms. Past medical history comments: alcohol gastritis and pancreatitis.    Past Medical History:  Diagnosis Date  . AAA (abdominal aortic aneurysm) (HCC) 10/2010   4.1cm on CT scan  . Anxiety   . Arthritis 11/08/2011   "feet and knees"  . Back pain, chronic   . Bipolar 1 disorder (HCC)   . Burning with urination 11/08/2011  . Chronic bronchitis (HCC) 11/08/2011   "get it q month"  . Daily headache 11/08/2011  . Depression   . GERD (gastroesophageal reflux disease)   . Hepatitis 11/08/2011   "not sure which one"  . History of stomach ulcers 11/08/2011  . Homelessness   . Hypertension   . Mental disorder 11/08/2011   "I get crazy; I take medicine"  . Myocardial infarction    "think so; when I was young; used to go to heart dr"  . Pneumonia   . Schizo-affective schizophrenia (HCC)   . Seizures (HCC) 11/08/2011   "jerking kind"  . Shortness of breath 11/08/2011   "lying down"  . Stroke (HCC) 11/08/2011   points to right chest    Patient Active Problem List   Diagnosis Date Noted  . Chronic pancreatitis (HCC) 11/14/2011  . Cannabis abuse 11/09/2011  . Hepatitis 11/08/2011  . Hyperglycemia 11/08/2011  . Alcohol abuse 11/08/2011  . Transaminasemia 11/08/2011  . Elevated lipase 11/08/2011  . Chronic back pain 02/07/2011  . Insomnia 02/07/2011    Past Surgical History:  Procedure  Laterality Date  . CHOLECYSTECTOMY  2008  . EUS  11/14/2011   Procedure: FULL UPPER ENDOSCOPIC ULTRASOUND (EUS) RADIAL;  Surgeon: Willis Modena, MD;  Location: [facility];  Service: Endoscopy;  Laterality: N/A;  to be carelinked from [facility]       Home Medications    Prior to Admission medications   Medication Sig Start Date End Date Taking? Authorizing Provider  cyclobenzaprine (FLEXERIL) 10 MG tablet Take 1 tablet (10 mg total) by mouth 2 (two) times daily as needed for muscle spasms. 01/03/16   Canary Brim Tegeler, MD  methocarbamol (ROBAXIN) 500 MG tablet Take 1 tablet (500 mg total) by mouth every 6 (six) hours as needed for muscle spasms. 06/07/16   Liberty Handy, PA-C  naproxen (NAPROSYN) 500 MG tablet Take 1 tablet (500 mg total) by mouth 2 (two) times daily. 06/07/16   Liberty Handy, PA-C  oxyCODONE-acetaminophen (PERCOCET/ROXICET) 5-325 MG tablet Take 2 tablets by mouth every 6 (six) hours as needed for severe pain. 01/03/16   Canary Brim Tegeler, MD  traMADol (ULTRAM) 50 MG tablet Take 1 tablet (50 mg total) by mouth every 12 (twelve) hours as needed for severe pain. 01/02/16   Danelle Berry, PA-C    Family History Family History  Problem Relation Age of Onset  . Hypertension Mother     Social History Social History  Substance Use Topics  . Smoking  status: Current Every Day Smoker    Packs/day: 0.50    Years: 29.00    Types: Cigarettes  . Smokeless tobacco: Never Used  . Alcohol use Yes     Comment: 11/08/2011 "4-5 bottles beer/wk"     Allergies   Patient has no known allergies.   Review of Systems Review of Systems  Constitutional: Negative for fever.  Gastrointestinal: Positive for abdominal pain.  Musculoskeletal: Negative for myalgias.  Neurological: Negative for headaches.  All other systems reviewed and are negative.    Physical Exam Updated Vital Signs BP 120/70   Pulse 99   Temp 97.4 F (36.3 C) (Oral)   Resp 18   SpO2 96%    Physical Exam  Constitutional: He appears well-developed and well-nourished.  HENT:  Head: Normocephalic and atraumatic.  Eyes: Conjunctivae are normal.  Neck: Neck supple.  Cardiovascular: Normal rate and regular rhythm.   No murmur heard. Pulmonary/Chest: Effort normal and breath sounds normal. No respiratory distress.  Abdominal: Soft. There is no tenderness.  Musculoskeletal: He exhibits no edema.  Neurological: He is alert.  Skin: Skin is warm and dry.  Psychiatric: He has a normal mood and affect.  Nursing note and vitals reviewed.    ED Treatments / Results  Labs (all labs ordered are listed, but only abnormal results are displayed) Labs Reviewed  COMPREHENSIVE METABOLIC PANEL - Abnormal; Notable for the following:       Result Value   Calcium 8.6 (*)    ALT 15 (*)    All other components within normal limits  URINALYSIS, ROUTINE W REFLEX MICROSCOPIC - Abnormal; Notable for the following:    Ketones, ur 5 (*)    All other components within normal limits  LIPASE, BLOOD  CBC    EKG  EKG Interpretation None       Radiology No results found.  Procedures Procedures (including critical care time)  Medications Ordered in ED Medications  gi cocktail (Maalox,Lidocaine,Donnatal) (30 mLs Oral Given 06/21/16 0800)  0.9 %  sodium chloride infusion ( Intravenous New Bag/Given 06/21/16 0802)  famotidine (PEPCID) IVPB 20 mg premix (20 mg Intravenous New Bag/Given 06/21/16 0801)     Initial Impression / Assessment and Plan / ED Course  I have reviewed the triage vital signs and the nursing notes.  Pertinent labs & imaging results that were available during my care of the patient were reviewed by me and considered in my medical decision making (see chart for details).     Pt given Iv fluids, pepcid and gi cocktail,  I suspect pt has alcoholic gastritis as he has been drinking.  No sign of pancreatitis.  Non acute abdomen.    Final Clinical Impressions(s) / ED  Diagnoses   Final diagnoses:  Acute alcoholic gastritis without hemorrhage    New Prescriptions New Prescriptions   No medications on file  Mylanta or pepcid otc  Wellness center referral   Elson Areas, PA-C 06/21/16 1610    Courteney Randall An, MD 06/21/16 1528

## 2016-07-08 ENCOUNTER — Encounter (HOSPITAL_COMMUNITY): Payer: Self-pay | Admitting: Emergency Medicine

## 2016-07-08 ENCOUNTER — Emergency Department (HOSPITAL_COMMUNITY)
Admission: EM | Admit: 2016-07-08 | Discharge: 2016-07-09 | Disposition: A | Discharge status: Home / Self Care | Payer: Medicaid Other | Attending: Emergency Medicine | Admitting: Emergency Medicine

## 2016-07-08 DIAGNOSIS — Y9289 Other specified places as the place of occurrence of the external cause: Secondary | ICD-10-CM | POA: Insufficient documentation

## 2016-07-08 DIAGNOSIS — M545 Low back pain: Secondary | ICD-10-CM

## 2016-07-08 DIAGNOSIS — I10 Essential (primary) hypertension: Secondary | ICD-10-CM | POA: Insufficient documentation

## 2016-07-08 DIAGNOSIS — F17210 Nicotine dependence, cigarettes, uncomplicated: Secondary | ICD-10-CM | POA: Insufficient documentation

## 2016-07-08 DIAGNOSIS — Z8673 Personal history of transient ischemic attack (TIA), and cerebral infarction without residual deficits: Secondary | ICD-10-CM | POA: Insufficient documentation

## 2016-07-08 DIAGNOSIS — Y999 Unspecified external cause status: Secondary | ICD-10-CM | POA: Insufficient documentation

## 2016-07-08 DIAGNOSIS — I252 Old myocardial infarction: Secondary | ICD-10-CM | POA: Insufficient documentation

## 2016-07-08 DIAGNOSIS — G8929 Other chronic pain: Secondary | ICD-10-CM

## 2016-07-08 DIAGNOSIS — X509XXA Other and unspecified overexertion or strenuous movements or postures, initial encounter: Secondary | ICD-10-CM | POA: Insufficient documentation

## 2016-07-08 DIAGNOSIS — S39012A Strain of muscle, fascia and tendon of lower back, initial encounter: Secondary | ICD-10-CM | POA: Insufficient documentation

## 2016-07-08 DIAGNOSIS — Y9301 Activity, walking, marching and hiking: Secondary | ICD-10-CM | POA: Insufficient documentation

## 2016-07-08 MED ORDER — IBUPROFEN 600 MG PO TABS: 600.0000 mg | ORAL_TABLET | Freq: Four times a day (QID) | ORAL | 0 refills | Status: DC | PRN

## 2016-07-08 MED ORDER — METHOCARBAMOL 500 MG PO TABS: 500.0000 mg | ORAL_TABLET | Freq: Two times a day (BID) | ORAL | 0 refills | Status: DC

## 2016-07-08 MED ORDER — METHOCARBAMOL 500 MG PO TABS
1000.0000 mg | ORAL_TABLET | Freq: Once | ORAL | Status: DC
  Filled 2016-07-08: qty 2

## 2016-07-08 MED ORDER — KETOROLAC TROMETHAMINE 30 MG/ML IJ SOLN
30.0000 mg | Freq: Once | INTRAMUSCULAR | Status: DC
  Filled 2016-07-08: qty 1

## 2016-07-08 MED ORDER — ACETAMINOPHEN 325 MG PO TABS
650.0000 mg | ORAL_TABLET | Freq: Once | ORAL | Status: DC
  Filled 2016-07-08: qty 2
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
## 2016-07-08 NOTE — Discharge Instructions (Signed)
Please read and follow all provided instructions.  Your diagnoses today include:  1. Chronic right-sided low back pain without sciatica   2. Strain of lumbar region, initial encounter    Tests performed today include: Vital signs - see below for your results today  Medications prescribed:   Take any prescribed medications only as directed.  Home care instructions:  Follow any educational materials contained in this packet Please rest, use ice or heat on your back for the next several days Do not lift, push, pull anything more than 10 pounds for the next week  Follow-up instructions: Please follow-up with your primary care provider in the next 1 week for further evaluation of your symptoms.   Return instructions:  SEEK IMMEDIATE MEDICAL ATTENTION IF YOU HAVE: New numbness, tingling, weakness, or problem with the use of your arms or legs Severe back pain not relieved with medications Loss control of your bowels or bladder Increasing pain in any areas of the body (such as chest or abdominal pain) Shortness of breath, dizziness, or fainting.  Worsening nausea (feeling sick to your stomach), vomiting, fever, or sweats Any other emergent concerns regarding your health   Additional Information:  Your vital signs today were: BP 118/81 (BP Location: Left Arm)    Temp 98.3 F (36.8 C) (Oral)    Resp 18    SpO2 98%  If your blood pressure (BP) was elevated above 135/85 this visit, please have this repeated by your doctor within one month. --------------

## 2016-07-08 NOTE — ED Triage Notes (Signed)
Pt presents to ED from [place] area (pt is homeless) and pt states he has chronic back pain which makes it difficult for him to take a deep breath.

## 2016-08-07 ENCOUNTER — Emergency Department (HOSPITAL_COMMUNITY)
Admission: EM | Admit: 2016-08-07 | Discharge: 2016-08-07 | Disposition: A | Discharge status: Home / Self Care | Payer: Medicaid Other

## 2016-08-11 ENCOUNTER — Emergency Department (HOSPITAL_COMMUNITY)
Admission: EM | Admit: 2016-08-11 | Discharge: 2016-08-12 | Disposition: A | Discharge status: Home / Self Care | Payer: Medicaid Other | Attending: Emergency Medicine | Admitting: Emergency Medicine

## 2016-08-11 DIAGNOSIS — F10120 Alcohol abuse with intoxication, uncomplicated: Secondary | ICD-10-CM | POA: Diagnosis present

## 2016-08-11 DIAGNOSIS — F10920 Alcohol use, unspecified with intoxication, uncomplicated: Secondary | ICD-10-CM

## 2016-08-11 NOTE — ED Notes (Signed)
[facility] Expected date:  Expected time:  Means of arrival:  Comments: 46 yo M  ETOH

## 2016-08-11 NOTE — ED Triage Notes (Signed)
Per EMS [place] [employer] heavily intoxicated. Unable to properly identify at this time.

## 2016-08-11 NOTE — ED Provider Notes (Signed)
[facility]-EMERGENCY DEPT Provider Note   CSN: [id number] Arrival date & time: 08/11/16  2306   By signing my name below, I, Clarisse Gouge, attest that this documentation has been prepared under the direction and in the presence of Devoria Albe, MD. Electronically signed, Clarisse Gouge, ED Scribe. 08/12/16. 1:41 AM.   Time seen 12:00 AM  History   Chief Complaint Chief Complaint  Patient presents with  . Alcohol Intoxication   LEVEL 5 CAVEAT for alcohol intoxication  The history is provided by the patient and medical records. The history is limited by the condition of the patient. No language interpreter was used.    Zyrus Hetland is a 46 y.o. male BIB EMS to the Emergency Department with concern for alcohol intoxication. Pt states his first name is ? Lars Mage; he does not communicate his last name clearly. Pt allegedly "sleeps outside", and states he is homelessness. No pain or any other problems noted. Pt allegedly unsure why he is in [facility] ED. No ID present.   No past medical history on file.  There are no active problems to display for this patient.   No past surgical history on file.     Home Medications    Prior to Admission medications   Not on File    Family History No family history on file.  Social History Social History  Substance Use Topics  . Smoking status: Not on file  . Smokeless tobacco: Not on file  . Alcohol use Not on file  unknown   Allergies   Patient has no allergy information on record.   Review of Systems Review of Systems  Unable to perform ROS: Mental status change     Physical Exam Updated Vital Signs BP 103/72 (BP Location: Right Arm)   Pulse 70   Resp 14   SpO2 97%   Vital signs normal    Physical Exam  Constitutional: He is oriented to person, place, and time. He appears well-developed and well-nourished.  Non-toxic appearance. He does not appear ill. No distress.  Sleeping. Hard to awaken with sternal rub. Speech slurred. Moving  all extremities. Smells heavily of alcohol.  HENT:  Head: Normocephalic and atraumatic.  Right Ear: External ear normal.  Left Ear: External ear normal.  Nose: Nose normal. No mucosal edema or rhinorrhea.  Mouth/Throat: Oropharynx is clear and moist and mucous membranes are normal. No dental abscesses or uvula swelling.  Eyes: Conjunctivae and EOM are normal. Pupils are equal, round, and reactive to light.  Neck: Normal range of motion and full passive range of motion without pain. Neck supple.  Cardiovascular: Normal rate, regular rhythm and normal heart sounds.  Exam reveals no gallop and no friction rub.   No murmur heard. Pulmonary/Chest: Effort normal and breath sounds normal. No respiratory distress. He has no wheezes. He has no rhonchi. He has no rales. He exhibits no tenderness and no crepitus.  Abdominal: Soft. Normal appearance and bowel sounds are normal. He exhibits no distension. There is no tenderness. There is no rebound and no guarding.  Musculoskeletal: Normal range of motion. He exhibits no edema or tenderness.  Moves all extremities well.   Neurological: He is alert and oriented to person, place, and time. He has normal strength. No cranial nerve deficit.  Skin: Skin is warm, dry and intact. No rash noted. No erythema. No pallor.  Psychiatric: He has a normal mood and affect. His speech is normal and behavior is normal. His mood appears not anxious.  Nursing  note and vitals reviewed.    ED Treatments / Results  DIAGNOSTIC STUDIES: Oxygen Saturation is 97% on RA, NL by my interpretation.       Labs (all labs ordered are listed, but only abnormal results are displayed) Results for orders placed or performed during the hospital encounter of 08/11/16  Ethanol  Result Value Ref Range   Alcohol, Ethyl (B) 297 (H) <5 mg/dL  Comprehensive metabolic panel  Result Value Ref Range   Sodium 144 135 - 145 mmol/L   Potassium 3.4 (L) 3.5 - 5.1 mmol/L   Chloride 112 (H) 101 -  111 mmol/L   CO2 22 22 - 32 mmol/L   Glucose, Bld 97 65 - 99 mg/dL   BUN 11 6 - 20 mg/dL   Creatinine, Ser 1.61 0.61 - 1.24 mg/dL   Calcium 8.3 (L) 8.9 - 10.3 mg/dL   Total Protein 6.6 6.5 - 8.1 g/dL   Albumin 3.8 3.5 - 5.0 g/dL   AST 21 15 - 41 U/L   ALT 13 (L) 17 - 63 U/L   Alkaline Phosphatase 57 38 - 126 U/L   Total Bilirubin 0.5 0.3 - 1.2 mg/dL   GFR calc non Af Amer 55 (L) >60 mL/min   GFR calc Af Amer >60 >60 mL/min   Anion gap 10 5 - 15  CBC with Differential  Result Value Ref Range   WBC 8.1 4.0 - 10.5 K/uL   RBC 4.97 4.22 - 5.81 MIL/uL   Hemoglobin 15.4 13.0 - 17.0 g/dL   HCT 09.6 04.5 - 40.9 %   MCV 88.5 78.0 - 100.0 fL   MCH 31.0 26.0 - 34.0 pg   MCHC 35.0 30.0 - 36.0 g/dL   RDW 81.1 91.4 - 78.2 %   Platelets 181 150 - 400 K/uL   Neutrophils Relative % 51 %   Neutro Abs 4.1 1.7 - 7.7 K/uL   Lymphocytes Relative 42 %   Lymphs Abs 3.4 0.7 - 4.0 K/uL   Monocytes Relative 5 %   Monocytes Absolute 0.4 0.1 - 1.0 K/uL   Eosinophils Relative 2 %   Eosinophils Absolute 0.2 0.0 - 0.7 K/uL   Basophils Relative 0 %   Basophils Absolute 0.0 0.0 - 0.1 K/uL   Laboratory interpretation all normal except Alcohol intoxication, mild hypokalemia    EKG  EKG Interpretation None       Radiology No results found.  Procedures Procedures (including critical care time)  Medications Ordered in ED Medications - No data to display   Initial Impression / Assessment and Plan / ED Course  I have reviewed the triage vital signs and the nursing notes.  Pertinent labs & imaging results that were available during my care of the patient were reviewed by me and considered in my medical decision making (see chart for details).   2:41 AM Pt awake and eating.  2:40 AM Pt states his name is Julien Nordmann.  Around 5:30 patient is awake and alert, he is ambulatory and wanting to leave. At this time we were able to understand his name and who we was. Patient was discharged.  Final  Clinical Impressions(s) / ED Diagnoses   Final diagnoses:  Alcoholic intoxication without complication (HCC)    New Prescriptions None  Plan discharge  Devoria Albe, MD, FACEP   I personally performed the services described in this documentation, which was scribed in my presence. The recorded information has been reviewed and considered.  Devoria Albe, MD, Armando Gang  Devoria AlbeKnapp, Alylah Blakney, MD 08/12/16 [phone]

## 2016-08-12 LAB — COMPREHENSIVE METABOLIC PANEL
ALBUMIN: 3.8 g/dL (ref 3.5–5.0)
ALK PHOS: 57 U/L (ref 38–126)
ALT: 13 U/L — AB (ref 17–63)
AST: 21 U/L (ref 15–41)
Anion gap: 10 (ref 5–15)
BUN: 11 mg/dL (ref 6–20)
CALCIUM: 8.3 mg/dL — AB (ref 8.9–10.3)
CO2: 22 mmol/L (ref 22–32)
CREATININE: 0.7 mg/dL (ref 0.61–1.24)
Chloride: 112 mmol/L — ABNORMAL HIGH (ref 101–111)
GFR calc Af Amer: >60 mL/min (ref >60)
GFR calc non Af Amer: 55 mL/min — ABNORMAL LOW (ref >60)
GLUCOSE: 97 mg/dL (ref 65–99)
Potassium: 3.4 mmol/L — ABNORMAL LOW (ref 3.5–5.1)
Sodium: 144 mmol/L (ref 135–145)
TOTAL PROTEIN: 6.6 g/dL (ref 6.5–8.1)
Total Bilirubin: 0.5 mg/dL (ref 0.3–1.2)

## 2016-08-12 LAB — CBC WITH DIFFERENTIAL/PLATELET
BASOS ABS: 0 10*3/uL (ref 0.0–0.1)
BASOS PCT: 0 %
EOS PCT: 2 %
Eosinophils Absolute: 0.2 10*3/uL (ref 0.0–0.7)
HCT: 44 % (ref 39.0–52.0)
Hemoglobin: 15.4 g/dL (ref 13.0–17.0)
Lymphocytes Relative: 42 %
Lymphs Abs: 3.4 10*3/uL (ref 0.7–4.0)
MCH: 31 pg (ref 26.0–34.0)
MCHC: 35 g/dL (ref 30.0–36.0)
MCV: 88.5 fL (ref 78.0–100.0)
MONO ABS: 0.4 10*3/uL (ref 0.1–1.0)
Monocytes Relative: 5 %
Neutro Abs: 4.1 10*3/uL (ref 1.7–7.7)
Neutrophils Relative %: 51 %
PLATELETS: 181 10*3/uL (ref 150–400)
RBC: 4.97 MIL/uL (ref 4.22–5.81)
RDW: 14.3 % (ref 11.5–15.5)
WBC: 8.1 10*3/uL (ref 4.0–10.5)

## 2016-08-12 LAB — ETHANOL: Alcohol, Ethyl (B): 297 mg/dL — ABNORMAL HIGH (ref <5)

## 2016-08-12 NOTE — Discharge Instructions (Signed)
Consider getting help for your alcohol problem. [facility]

## 2016-08-26 ENCOUNTER — Encounter (HOSPITAL_COMMUNITY): Payer: Self-pay | Admitting: Emergency Medicine

## 2016-08-26 DIAGNOSIS — R238 Other skin changes: Secondary | ICD-10-CM | POA: Insufficient documentation

## 2016-08-26 DIAGNOSIS — M79672 Pain in left foot: Secondary | ICD-10-CM | POA: Insufficient documentation

## 2016-08-26 DIAGNOSIS — Z5321 Procedure and treatment not carried out due to patient leaving prior to being seen by health care provider: Secondary | ICD-10-CM | POA: Insufficient documentation

## 2016-08-26 DIAGNOSIS — M79671 Pain in right foot: Secondary | ICD-10-CM | POA: Insufficient documentation

## 2016-08-26 NOTE — ED Triage Notes (Signed)
Pt brought to ED by [facility] from home for bilateral foot pain secondary to blister, some ETOH on board.

## 2016-08-26 NOTE — ED Notes (Signed)
Patient called x2 to go back to room, pt did not answer

## 2016-08-27 ENCOUNTER — Emergency Department (HOSPITAL_COMMUNITY)
Admission: EM | Admit: 2016-08-27 | Discharge: 2016-08-27 | Discharge status: Against Medical Advice | Payer: Self-pay | Attending: Emergency Medicine | Admitting: Emergency Medicine

## 2016-08-27 NOTE — ED Notes (Signed)
Pt called back to room again, no answer

## 2016-08-27 NOTE — ED Notes (Signed)
Pt called back to room x1 with no answer

## 2016-09-07 ENCOUNTER — Emergency Department (HOSPITAL_COMMUNITY)
Admission: EM | Admit: 2016-09-07 | Discharge: 2016-09-07 | Discharge status: Against Medical Advice | Payer: Self-pay | Attending: Emergency Medicine | Admitting: Emergency Medicine

## 2016-09-07 DIAGNOSIS — F17210 Nicotine dependence, cigarettes, uncomplicated: Secondary | ICD-10-CM | POA: Insufficient documentation

## 2016-09-07 DIAGNOSIS — Z79899 Other long term (current) drug therapy: Secondary | ICD-10-CM | POA: Insufficient documentation

## 2016-09-07 DIAGNOSIS — I10 Essential (primary) hypertension: Secondary | ICD-10-CM | POA: Insufficient documentation

## 2016-09-07 DIAGNOSIS — G259 Extrapyramidal and movement disorder, unspecified: Secondary | ICD-10-CM | POA: Insufficient documentation

## 2016-09-07 NOTE — ED Provider Notes (Signed)
[facility]ERG[facility]Y DEPT Provider Note   CSN: [id number] Arrival date & time: 09/07/16  1459     History   Chief Complaint Chief Complaint  Patient presents with  . Medication Reaction    HPI Dustin Hansen is a 46 y.o. male.  HPI  46 year old male with a history of schizoaffective schizophrenia who was recently placed on Haldol presents to the ED with dystonic reaction. Patient reported starting a regimen of Haldol yesterday and reported that several hours ago, he started having jaw spasms and back spasms which became gradually worse and constant. EMS was called who gave the patient 50 mg of IV Benadryl which completely resolved the patient's symptoms. No other alleviating or aggravating factors. Upon arrival patient was complaining of mild lower back pain. Denied any suicide ideation, homicide ideation, ADH. No urinary or bladder incontinence. Patient able to move lower extremities and denies any loss of sensation. Denied any other physical complaints.  Past Medical History:  Diagnosis Date  . AAA (abdominal aortic aneurysm) (HCC) 10/2010   4.1cm on CT scan  . Anxiety   . Arthritis 11/08/2011   "feet and knees"  . Back pain, chronic   . Bipolar 1 disorder (HCC)   . Burning with urination 11/08/2011  . Chronic bronchitis (HCC) 11/08/2011   "get it q month"  . Daily headache 11/08/2011  . Depression   . GERD (gastroesophageal reflux disease)   . Hepatitis 11/08/2011   "not sure which one"  . History of stomach ulcers 11/08/2011  . Homelessness   . Hypertension   . Mental disorder 11/08/2011   "I get crazy; I take medicine"  . Myocardial infarction [facility](HCC)    "think so; when I was young; used to go to heart dr"  . Pneumonia   . Schizo-affective schizophrenia (HCC)   . Seizures (HCC) 11/08/2011   "jerking kind"  . Shortness of breath 11/08/2011   "lying down"  . Stroke (HCC) 11/08/2011   points to right chest    Patient Active Problem List   Diagnosis Date Noted  . Chronic  pancreatitis (HCC) 11/14/2011  . Cannabis abuse 11/09/2011  . Hepatitis 11/08/2011  . Hyperglycemia 11/08/2011  . Alcohol abuse 11/08/2011  . Transaminasemia 11/08/2011  . Elevated lipase 11/08/2011  . Chronic back pain 02/07/2011  . Insomnia 02/07/2011    Past Surgical History:  Procedure Laterality Date  . CHOLECYSTECTOMY  2008  . EUS  11/14/2011   Procedure: FULL UPPER ENDOSCOPIC ULTRASOUND (EUS) RADIAL;  Surgeon: Willis ModenaWilliam Outlaw, MD;  Location: [facility];  Service: Endoscopy;  Laterality: N/A;  [facility]       Home Medications    Prior to Admission medications   Medication Sig Start Date End Date Taking? Authorizing Provider  cyclobenzaprine (FLEXERIL) 10 MG tablet Take 1 tablet (10 mg total) by mouth 2 (two) times daily as needed for muscle spasms. Patient not taking: Reported on 06/21/2016 01/03/16   Tegeler, Canary Brimhristopher J, MD  ibuprofen (ADVIL,MOTRIN) 600 MG tablet Take 1 tablet (600 mg total) by mouth every 6 (six) hours as needed. 07/08/16   Audry PiliMohr, Tyler, PA-C  methocarbamol (ROBAXIN) 500 MG tablet Take 1 tablet (500 mg total) by mouth 2 (two) times daily. 07/08/16   Audry PiliMohr, Tyler, PA-C  naproxen (NAPROSYN) 500 MG tablet Take 1 tablet (500 mg total) by mouth 2 (two) times daily. Patient not taking: Reported on 06/21/2016 06/07/16   Liberty HandyGibbons, Claudia J, PA-C  oxyCODONE-acetaminophen (PERCOCET/ROXICET) 5-325 MG tablet Take 2 tablets by mouth every  6 (six) hours as needed for severe pain. Patient not taking: Reported on 06/21/2016 01/03/16   Tegeler, Canary Brim, MD  traMADol (ULTRAM) 50 MG tablet Take 1 tablet (50 mg total) by mouth every 12 (twelve) hours as needed for severe pain. Patient not taking: Reported on 06/21/2016 01/02/16   Danelle Berry, PA-C    Family History Family History  Problem Relation Age of Onset  . Hypertension Mother     Social History Social History  Substance Use Topics  . Smoking status: Current Every Day Smoker    Packs/day: 0.50     Years: 29.00    Types: Cigarettes  . Smokeless tobacco: Never Used  . Alcohol use Yes     Comment: 11/08/2011 "4-5 bottles beer/wk"     Allergies   Patient has no known allergies.   Review of Systems Review of Systems All other systems are reviewed and are negative for acute change except as noted in the HPI   Physical Exam Updated Vital Signs BP (!) 166/104 (BP Location: Right Arm)   Pulse 72   Temp 98.1 F (36.7 C) (Oral)   Resp 20   SpO2 100%   Physical Exam  Constitutional: He is oriented to person, place, and time. He appears well-developed and well-nourished. No distress.  HENT:  Head: Normocephalic and atraumatic.  Nose: Nose normal.  Eyes: Conjunctivae and EOM are normal. Pupils are equal, round, and reactive to light. Right eye exhibits no discharge. Left eye exhibits no discharge. No scleral icterus.  Neck: Normal range of motion. Neck supple.  Cardiovascular: Normal rate and regular rhythm.  Exam reveals no gallop and no friction rub.   No murmur heard. Pulmonary/Chest: Effort normal and breath sounds normal. No stridor. No respiratory distress. He has no rales.  Abdominal: Soft. He exhibits no distension. There is no tenderness.  Musculoskeletal: He exhibits no edema.       Lumbar back: He exhibits tenderness (mild).       Back:  Neurological: He is alert and oriented to person, place, and time.  Skin: Skin is warm and dry. No rash noted. He is not diaphoretic. No erythema.  Psychiatric: He has a normal mood and affect.  Vitals reviewed.    ED Treatments / Results  Labs (all labs ordered are listed, but only abnormal results are displayed) Labs Reviewed - No data to display  EKG  EKG Interpretation None       Radiology No results found.  Procedures Procedures (including critical care time)  Medications Ordered in ED Medications - No data to display   Initial Impression / Assessment and Plan / ED Course  I have reviewed the triage vital  signs and the nursing notes.  Pertinent labs & imaging results that were available during my care of the patient were reviewed by me and considered in my medical decision making (see chart for details).     45 y.o. male presents dystonic reaction from Haldol currently resolved following 50 mg of IV Benadryl by EMS.  Also with back pain in lumbar area without signs of radicular pain. No acute traumatic onset. No red flag symptoms of fever, weight loss, saddle anesthesia, weakness, fecal/urinary incontinence or urinary retention.   Suspect MSK etiology secondary to muscular spasm from the dystonic reaction. No indication for imaging emergently.  We'll contact psychiatry for medication change recommendations.  4:40 PM  Informed that patient eloped from the emergency department. Patient was seen walking out of the department without any complications.  Final Clinical Impressions(s) / ED Diagnoses   Final diagnoses:  Extrapyramidal reaction      Cardama, Amadeo Garnet, MD 09/07/16 [phone]

## 2016-09-07 NOTE — ED Triage Notes (Signed)
Pt arrives via EMs from home with extrapyramidal reaction to haldol today. Pt with clenching of teeth, and flexion of upper extremities on EMS arrival. Pts haldol dosage was adjusted by MD yesterday. Pt received 20g L hand, 50mg  benedryl PTA, 450ml IVF with resolution of symptoms. Pts cbg 155, VSS, A and Ox4.

## 2016-09-07 NOTE — ED Notes (Signed)
Pt walked out of room without being discharged. Pt did not mention to any staff that he was leaving. Pt left IV on bed. Pt in stable condition.

## 2016-09-20 ENCOUNTER — Emergency Department (HOSPITAL_COMMUNITY): Payer: Self-pay

## 2016-09-20 ENCOUNTER — Encounter (HOSPITAL_COMMUNITY): Payer: Self-pay | Admitting: Obstetrics and Gynecology

## 2016-09-20 ENCOUNTER — Emergency Department (HOSPITAL_COMMUNITY)
Admission: EM | Admit: 2016-09-20 | Discharge: 2016-09-20 | Disposition: A | Discharge status: Home / Self Care | Payer: Self-pay | Attending: Emergency Medicine | Admitting: Emergency Medicine

## 2016-09-20 DIAGNOSIS — F10920 Alcohol use, unspecified with intoxication, uncomplicated: Secondary | ICD-10-CM | POA: Insufficient documentation

## 2016-09-20 DIAGNOSIS — F17210 Nicotine dependence, cigarettes, uncomplicated: Secondary | ICD-10-CM | POA: Insufficient documentation

## 2016-09-20 DIAGNOSIS — R0600 Dyspnea, unspecified: Secondary | ICD-10-CM | POA: Insufficient documentation

## 2016-09-20 DIAGNOSIS — I10 Essential (primary) hypertension: Secondary | ICD-10-CM | POA: Insufficient documentation

## 2016-09-20 DIAGNOSIS — R0602 Shortness of breath: Secondary | ICD-10-CM | POA: Insufficient documentation

## 2016-09-20 DIAGNOSIS — Z79899 Other long term (current) drug therapy: Secondary | ICD-10-CM | POA: Insufficient documentation

## 2016-09-20 LAB — CBC WITH DIFFERENTIAL/PLATELET
BASOS PCT: 0 %
Basophils Absolute: 0 10*3/uL (ref 0.0–0.1)
EOS ABS: 0.2 10*3/uL (ref 0.0–0.7)
EOS PCT: 3 %
HCT: 41.9 % (ref 39.0–52.0)
Hemoglobin: 14.6 g/dL (ref 13.0–17.0)
LYMPHS ABS: 2.4 10*3/uL (ref 0.7–4.0)
Lymphocytes Relative: 39 %
MCH: 31.1 pg (ref 26.0–34.0)
MCHC: 34.8 g/dL (ref 30.0–36.0)
MCV: 89.3 fL (ref 78.0–100.0)
Monocytes Absolute: 0.3 10*3/uL (ref 0.1–1.0)
Monocytes Relative: 5 %
NEUTROS PCT: 53 %
Neutro Abs: 3.3 10*3/uL (ref 1.7–7.7)
PLATELETS: 184 10*3/uL (ref 150–400)
RBC: 4.69 MIL/uL (ref 4.22–5.81)
RDW: 13.2 % (ref 11.5–15.5)
WBC: 6.2 10*3/uL (ref 4.0–10.5)

## 2016-09-20 LAB — COMPREHENSIVE METABOLIC PANEL
ALT: 15 U/L — ABNORMAL LOW (ref 17–63)
AST: 22 U/L (ref 15–41)
Albumin: 3.8 g/dL (ref 3.5–5.0)
Alkaline Phosphatase: 51 U/L (ref 38–126)
Anion gap: 11 (ref 5–15)
BUN: 11 mg/dL (ref 6–20)
CHLORIDE: 107 mmol/L (ref 101–111)
CO2: 23 mmol/L (ref 22–32)
Calcium: 9.1 mg/dL (ref 8.9–10.3)
Creatinine, Ser: 0.77 mg/dL (ref 0.61–1.24)
Glucose, Bld: 96 mg/dL (ref 65–99)
POTASSIUM: 3.4 mmol/L — AB (ref 3.5–5.1)
SODIUM: 141 mmol/L (ref 135–145)
Total Bilirubin: 0.9 mg/dL (ref 0.3–1.2)
Total Protein: 6.5 g/dL (ref 6.5–8.1)

## 2016-09-20 LAB — TROPONIN I: Troponin I: <0.03 ng/mL (ref <0.03)

## 2016-09-20 LAB — RAPID URINE DRUG SCREEN, HOSP PERFORMED
AMPHETAMINES: NOT DETECTED
BENZODIAZEPINES: NOT DETECTED
Barbiturates: NOT DETECTED
COCAINE: NOT DETECTED
OPIATES: NOT DETECTED
Tetrahydrocannabinol: NOT DETECTED

## 2016-09-20 LAB — ETHANOL: ALCOHOL ETHYL (B): 182 mg/dL — AB (ref <5)

## 2016-09-20 NOTE — ED Triage Notes (Signed)
Per EMS Pt was found on a street corner waving his hands and sat down and started crying. Pt was ambulatory on scene. Pt fell asleep in the ambulance.  Vitals: Pt at 93% on RA, 98% on 2L 114/80 HR 76 118 CBG

## 2016-09-20 NOTE — ED Notes (Signed)
Pt is aware that urine is needed for sample, Urinal at bedside.

## 2016-09-20 NOTE — ED Provider Notes (Addendum)
[facility]-EMERGENCY DEPT Provider Note   CSN: [id number] Arrival date & time: 09/20/16  1612     History   Chief Complaint Chief Complaint  Patient presents with  . Alcohol Intoxication  . Shortness of Breath    HPI Dustin Hansen is a 46 y.o. male.  46 year old male presents via EMS complaining of shortness of breath. Patient admits to copious amounts of EtOH today. The history is limited due to his current level of intoxication. States has been short of breath but cannot elaborate for how long he has felt this way. Falls asleep frequently during this exam. Denies any abdominal or chest discomfort. States she's not been coughing.      Past Medical History:  Diagnosis Date  . AAA (abdominal aortic aneurysm) (HCC) 10/2010   4.1cm on CT scan  . Anxiety   . Arthritis 11/08/2011   "feet and knees"  . Back pain, chronic   . Bipolar 1 disorder (HCC)   . Burning with urination 11/08/2011  . Chronic bronchitis (HCC) 11/08/2011   "get it q month"  . Daily headache 11/08/2011  . Depression   . GERD (gastroesophageal reflux disease)   . Hepatitis 11/08/2011   "not sure which one"  . History of stomach ulcers 11/08/2011  . Homelessness   . Hypertension   . Mental disorder 11/08/2011   "I get crazy; I take medicine"  . Myocardial infarction [facility](HCC)    "think so; when I was young; used to go to heart dr"  . Pneumonia   . Schizo-affective schizophrenia (HCC)   . Seizures (HCC) 11/08/2011   "jerking kind"  . Shortness of breath 11/08/2011   "lying down"  . Stroke (HCC) 11/08/2011   points to right chest    Patient Active Problem List   Diagnosis Date Noted  . Chronic pancreatitis (HCC) 11/14/2011  . Cannabis abuse 11/09/2011  . Hepatitis 11/08/2011  . Hyperglycemia 11/08/2011  . Alcohol abuse 11/08/2011  . Transaminasemia 11/08/2011  . Elevated lipase 11/08/2011  . Chronic back pain 02/07/2011  . Insomnia 02/07/2011    Past Surgical History:  Procedure Laterality Date  .  CHOLECYSTECTOMY  2008  . EUS  11/14/2011   Procedure: FULL UPPER ENDOSCOPIC ULTRASOUND (EUS) RADIAL;  Surgeon: Willis ModenaWilliam Outlaw, MD;  Location: [facility] ENDOSCOPY;  Service: Endoscopy;  Laterality: N/A;  [facility]       Home Medications    Prior to Admission medications   Medication Sig Start Date End Date Taking? Authorizing Provider  cyclobenzaprine (FLEXERIL) 10 MG tablet Take 1 tablet (10 mg total) by mouth 2 (two) times daily as needed for muscle spasms. Patient not taking: Reported on 06/21/2016 01/03/16   Tegeler, Canary Brimhristopher J, MD  ibuprofen (ADVIL,MOTRIN) 600 MG tablet Take 1 tablet (600 mg total) by mouth every 6 (six) hours as needed. 07/08/16   Audry PiliMohr, Tyler, PA-C  methocarbamol (ROBAXIN) 500 MG tablet Take 1 tablet (500 mg total) by mouth 2 (two) times daily. 07/08/16   Audry PiliMohr, Tyler, PA-C  naproxen (NAPROSYN) 500 MG tablet Take 1 tablet (500 mg total) by mouth 2 (two) times daily. Patient not taking: Reported on 06/21/2016 06/07/16   Liberty HandyGibbons, Claudia J, PA-C  oxyCODONE-acetaminophen (PERCOCET/ROXICET) 5-325 MG tablet Take 2 tablets by mouth every 6 (six) hours as needed for severe pain. Patient not taking: Reported on 06/21/2016 01/03/16   Tegeler, Canary Brimhristopher J, MD  traMADol (ULTRAM) 50 MG tablet Take 1 tablet (50 mg total) by mouth every 12 (twelve) hours as needed for  severe pain. Patient not taking: Reported on 06/21/2016 01/02/16   Danelle Berry, PA-C    Family History Family History  Problem Relation Age of Onset  . Hypertension Mother     Social History Social History  Substance Use Topics  . Smoking status: Current Every Day Smoker    Packs/day: 0.50    Years: 29.00    Types: Cigarettes  . Smokeless tobacco: Never Used  . Alcohol use Yes     Comment: 11/08/2011 "4-5 bottles beer/wk"     Allergies   Patient has no known allergies.   Review of Systems Review of Systems  Unable to perform ROS: Mental status change     Physical Exam Updated Vital Signs BP  118/80 (BP Location: Left Arm)   Pulse 75   Temp 98.3 F (36.8 C) (Oral)   Resp 19   SpO2 95%   Physical Exam  Constitutional: He appears well-developed and well-nourished. He appears lethargic.  Non-toxic appearance. No distress.  HENT:  Head: Normocephalic and atraumatic.  Eyes: Conjunctivae, EOM and lids are normal. Pupils are equal, round, and reactive to light.  Neck: Normal range of motion. Neck supple. No tracheal deviation present. No thyroid mass present.  Cardiovascular: Normal rate, regular rhythm and normal heart sounds.  Exam reveals no gallop.   No murmur heard. Pulmonary/Chest: Effort normal and breath sounds normal. No stridor. No respiratory distress. He has no decreased breath sounds. He has no wheezes. He has no rhonchi. He has no rales.  Abdominal: Soft. Normal appearance and bowel sounds are normal. He exhibits no distension. There is no tenderness. There is no rebound and no CVA tenderness.  Musculoskeletal: Normal range of motion. He exhibits no edema or tenderness.  Neurological: He appears lethargic. He displays no tremor. No cranial nerve deficit or sensory deficit. GCS eye subscore is 4. GCS verbal subscore is 5. GCS motor subscore is 6.  Skin: Skin is warm and dry. No abrasion and no rash noted.  Psychiatric: His affect is blunt. He is slowed. He is noncommunicative. He is inattentive.  Nursing note and vitals reviewed.    ED Treatments / Results  Labs (all labs ordered are listed, but only abnormal results are displayed) Labs Reviewed  CBC WITH DIFFERENTIAL/PLATELET  ETHANOL  COMPREHENSIVE METABOLIC PANEL    EKG  EKG Interpretation None       Radiology No results found.  Procedures Procedures (including critical care time)  Medications Ordered in ED Medications - No data to display   Initial Impression / Assessment and Plan / ED Course  I have reviewed the triage vital signs and the nursing notes.  Pertinent labs & imaging results  that were available during my care of the patient were reviewed by me and considered in my medical decision making (see chart for details).    8:39 PM  Patient's workup for his dyspnea is negative. I do not believe it is from cardiac or pulmonary etiology. Suspect is from his alcohol intoxication. Chest x-ray negative. EKG and troponin both reassuring. His alcohol level was 182 and he was allowed metabolized. At this time, he is clinically sober and stable for discharge  Final Clinical Impressions(s) / ED Diagnoses   Final diagnoses:  SOB (shortness of breath)    New Prescriptions New Prescriptions   No medications on file     Lorre Nick, MD 09/20/16 2039    Lorre Nick, MD 09/20/16 2040

## 2016-09-20 NOTE — ED Notes (Signed)
Attempted to obtain urine sample. Pt said he could not pee at this time

## 2016-09-23 ENCOUNTER — Emergency Department (HOSPITAL_COMMUNITY)
Admission: EM | Admit: 2016-09-23 | Discharge: 2016-09-24 | Disposition: A | Discharge status: Home / Self Care | Payer: Self-pay | Attending: Emergency Medicine | Admitting: Emergency Medicine

## 2016-09-23 ENCOUNTER — Encounter (HOSPITAL_COMMUNITY): Payer: Self-pay | Admitting: Emergency Medicine

## 2016-09-23 DIAGNOSIS — G8929 Other chronic pain: Secondary | ICD-10-CM | POA: Insufficient documentation

## 2016-09-23 DIAGNOSIS — R52 Pain, unspecified: Secondary | ICD-10-CM | POA: Insufficient documentation

## 2016-09-23 DIAGNOSIS — F17210 Nicotine dependence, cigarettes, uncomplicated: Secondary | ICD-10-CM | POA: Insufficient documentation

## 2016-09-23 DIAGNOSIS — I10 Essential (primary) hypertension: Secondary | ICD-10-CM | POA: Insufficient documentation

## 2016-09-23 DIAGNOSIS — I252 Old myocardial infarction: Secondary | ICD-10-CM | POA: Insufficient documentation

## 2016-09-23 DIAGNOSIS — Z8673 Personal history of transient ischemic attack (TIA), and cerebral infarction without residual deficits: Secondary | ICD-10-CM | POA: Insufficient documentation

## 2016-09-23 NOTE — ED Notes (Signed)
Pt presents to [facility] from triage, pt threw cuff and labels onto table then went outside to and began smoking directly doors. Pt ask to move to street for smoking

## 2016-09-23 NOTE — ED Triage Notes (Addendum)
Pt brought to ED via [facility] with c/o shortness of breath.  At this time pt does not c/o shortness at this time.  St's he doesn't feel good.  Pt asking for food in triage

## 2016-09-24 MED ORDER — ACETAMINOPHEN 325 MG PO TABS: 650.0000 mg | ORAL_TABLET | Freq: Four times a day (QID) | ORAL | 0 refills | Status: DC | PRN

## 2016-09-24 MED ORDER — ACETAMINOPHEN 325 MG PO TABS
650.0000 mg | ORAL_TABLET | Freq: Once | ORAL | Status: AC
  Administered 2016-09-24: 650 mg via ORAL
  Filled 2016-09-24: qty 2

## 2016-09-24 MED ORDER — CYCLOBENZAPRINE HCL 10 MG PO TABS: 10.0000 mg | ORAL_TABLET | Freq: Two times a day (BID) | ORAL | 0 refills | Status: DC | PRN

## 2016-09-24 MED ORDER — IBUPROFEN 200 MG PO TABS
600.0000 mg | ORAL_TABLET | Freq: Once | ORAL | Status: AC
  Administered 2016-09-24: 600 mg via ORAL
  Filled 2016-09-24: qty 1

## 2016-09-24 NOTE — ED Notes (Signed)
ED Provider at bedside. 

## 2016-09-24 NOTE — ED Notes (Signed)
Patient left at this time with all belongings. 

## 2016-09-24 NOTE — ED Provider Notes (Signed)
[facility]-EMERGENCY DEPT Provider Note   CSN: [id number] Arrival date & time: 09/23/16  2221 By signing my name below, I, Levon Hedger, attest that this documentation has been prepared under the direction and in the presence of Derwood Kaplan, MD . Electronically Signed: Levon Hedger, Scribe. 09/24/2016. 1:44 AM.   History   Chief Complaint Chief Complaint  Patient presents with  . Shortness of Breath   HPI Dustin Hansen is a 46 y.o. male with a history of AAA, chronic back pain, bipolar disorder, and homelessness who presents to the Emergency Department complaining of gradually worsening generalized body aches onset 12/28/16. Pt states "I have pain all over my body", but reports pain is most severe to his back. No OTC treatments tried for these symptoms PTA. Pt has not been evaluated for this outside of the ED due to financial issues. No known injury or trauma. Pt is a current 1 pack per day smoker; he endorses drinking 1 alcoholic beverage today. Pt denies any rhinorrhea or congestion.  The history is provided by the patient. A language interpreter was used.   Past Medical History:  Diagnosis Date  . AAA (abdominal aortic aneurysm) (HCC) 10/2010   4.1cm on CT scan  . Anxiety   . Arthritis 11/08/2011   "feet and knees"  . Back pain, chronic   . Bipolar 1 disorder (HCC)   . Burning with urination 11/08/2011  . Chronic bronchitis (HCC) 11/08/2011   "get it q month"  . Daily headache 11/08/2011  . Depression   . GERD (gastroesophageal reflux disease)   . Hepatitis 11/08/2011   "not sure which one"  . History of stomach ulcers 11/08/2011  . Homelessness   . Hypertension   . Mental disorder 11/08/2011   "I get crazy; I take medicine"  . Myocardial infarction [facility])    "think so; when I was young; used to go to heart dr"  . Pneumonia   . Schizo-affective schizophrenia (HCC)   . Seizures (HCC) 11/08/2011   "jerking kind"  . Shortness of breath 11/08/2011   "lying down"  . Stroke (HCC)  11/08/2011   points to right chest    Patient Active Problem List   Diagnosis Date Noted  . Chronic pancreatitis (HCC) 11/14/2011  . Cannabis abuse 11/09/2011  . Hepatitis 11/08/2011  . Hyperglycemia 11/08/2011  . Alcohol abuse 11/08/2011  . Transaminasemia 11/08/2011  . Elevated lipase 11/08/2011  . Chronic back pain 02/07/2011  . Insomnia 02/07/2011    Past Surgical History:  Procedure Laterality Date  . CHOLECYSTECTOMY  2008  . EUS  11/14/2011   Procedure: FULL UPPER ENDOSCOPIC ULTRASOUND (EUS) RADIAL;  Surgeon: Willis Modena, MD;  Location: [facility] ENDOSCOPY;  Service: Endoscopy;  Laterality: N/A;  to be carelinked from [facility]    Home Medications    Prior to Admission medications   Medication Sig Start Date End Date Taking? Authorizing Provider  acetaminophen (TYLENOL) 325 MG tablet Take 2 tablets (650 mg total) by mouth every 6 (six) hours as needed. 09/24/16   Derwood Kaplan, MD  cyclobenzaprine (FLEXERIL) 10 MG tablet Take 1 tablet (10 mg total) by mouth 2 (two) times daily as needed for muscle spasms. 09/24/16   Derwood Kaplan, MD  ibuprofen (ADVIL,MOTRIN) 600 MG tablet Take 1 tablet (600 mg total) by mouth every 6 (six) hours as needed. Patient not taking: Reported on 09/20/2016 07/08/16   [name], PA-C  methocarbamol (ROBAXIN) 500 MG tablet Take 1 tablet (500 mg total) by mouth 2 (two) times daily.  Patient not taking: Reported on 09/20/2016 07/08/16   [name], PA-C  naproxen (NAPROSYN) 500 MG tablet Take 1 tablet (500 mg total) by mouth 2 (two) times daily. Patient not taking: Reported on 06/21/2016 06/07/16   Liberty Handy, PA-C  oxyCODONE-acetaminophen (PERCOCET/ROXICET) 5-325 MG tablet Take 2 tablets by mouth every 6 (six) hours as needed for severe pain. Patient not taking: Reported on 06/21/2016 01/03/16   Tegeler, Canary Brim, MD  traMADol (ULTRAM) 50 MG tablet Take 1 tablet (50 mg total) by mouth every 12 (twelve) hours as needed for severe pain. Patient not taking:  Reported on 06/21/2016 01/02/16   Danelle Berry, PA-C    Family History Family History  Problem Relation Age of Onset  . Hypertension Mother     Social History Social History  Substance Use Topics  . Smoking status: Current Every Day Smoker    Packs/day: 0.50    Years: 29.00    Types: Cigarettes  . Smokeless tobacco: Never Used  . Alcohol use Yes     Comment: 11/08/2011 "4-5 bottles beer/wk"     Allergies   Patient has no known allergies.   Review of Systems Review of Systems All systems reviewed and are negative for acute change except as noted in the HPI.  Physical Exam Updated Vital Signs BP (!) 153/103 (BP Location: Right Arm)   Pulse 78   Temp 98.3 F (36.8 C) (Oral)   Resp 18   Wt 67.7 kg (149 lb 4 oz)   SpO2 98%   BMI 25.62 kg/m   Physical Exam  Constitutional: He is oriented to person, place, and time. He appears well-developed and well-nourished. No distress.  HENT:  Head: Normocephalic and atraumatic.  Moist mucus membranes  Eyes: Conjunctivae are normal. No scleral icterus.  Cardiovascular: Normal rate and regular rhythm.   2+ and radial pulse bilaterally   Pulmonary/Chest: Effort normal.  Lungs CTA bilaterally  Abdominal: Soft. He exhibits no distension.  Musculoskeletal:  No gross deformities to upper or lower extremities. Spine exam reveals no step-offs. No ecchymosis over the back. Reproducible tenderness over entire torso.   Lymphadenopathy:    He has no cervical adenopathy.  Neurological: He is alert and oriented to person, place, and time.  Skin: Skin is warm and dry.  Nursing note and vitals reviewed.  ED Treatments / Results  DIAGNOSTIC STUDIES:  Oxygen Saturation is 98% on RA, normal by my interpretation.    COORDINATION OF CARE:  1:38 AM Pt to f/u with [facility]. Discussed treatment plan with pt at bedside and pt agreed to plan.   Labs (all labs ordered are listed, but only abnormal results are  displayed) Labs Reviewed - No data to display  EKG  EKG Interpretation None       Radiology No results found.  Procedures Procedures (including critical care time)  Medications Ordered in ED Medications  acetaminophen (TYLENOL) tablet 650 mg (not administered)  ibuprofen (ADVIL,MOTRIN) tablet 600 mg (not administered)     Initial Impression / Assessment and Plan / ED Course  I have reviewed the triage vital signs and the nursing notes.  Pertinent labs & imaging results that were available during my care of the patient were reviewed by me and considered in my medical decision making (see chart for details).     Pt comes in with cc of pain. His complains changed - he denies dib to me. Pt reports pain - generalized, worse over the back that has been  present for months. No need for further imaging. [facility] info provided.   Final Clinical Impressions(s) / ED Diagnoses   Final diagnoses:  Chronic generalized pain    New Prescriptions New Prescriptions   ACETAMINOPHEN (TYLENOL) 325 MG TABLET    Take 2 tablets (650 mg total) by mouth every 6 (six) hours as needed.   I personally performed the services described in this documentation, which was scribed in my presence. The recorded information has been reviewed and is accurate.     Derwood KaplanNanavati, Celine Dishman, MD 09/24/16 [phone]

## 2016-10-05 ENCOUNTER — Emergency Department (HOSPITAL_COMMUNITY)
Admission: EM | Admit: 2016-10-05 | Discharge: 2016-10-05 | Disposition: A | Discharge status: Home / Self Care | Payer: Self-pay | Attending: Emergency Medicine | Admitting: Emergency Medicine

## 2016-10-05 ENCOUNTER — Encounter (HOSPITAL_COMMUNITY): Payer: Self-pay | Admitting: Emergency Medicine

## 2016-10-05 DIAGNOSIS — R51 Headache: Secondary | ICD-10-CM | POA: Insufficient documentation

## 2016-10-05 DIAGNOSIS — Z5321 Procedure and treatment not carried out due to patient leaving prior to being seen by health care provider: Secondary | ICD-10-CM | POA: Insufficient documentation

## 2016-10-05 DIAGNOSIS — F10920 Alcohol use, unspecified with intoxication, uncomplicated: Secondary | ICD-10-CM | POA: Insufficient documentation

## 2016-10-05 MED ORDER — ACETAMINOPHEN 325 MG PO TABS
650.0000 mg | ORAL_TABLET | Freq: Once | ORAL | Status: DC
  Filled 2016-10-05: qty 2

## 2016-10-05 NOTE — ED Triage Notes (Signed)
Reports headache since 2100 last night. ETOH on board, ambulatory at a steady gait. Wants to sleep.

## 2016-10-05 NOTE — ED Notes (Signed)
Unable to locate pt in department. [facility].

## 2016-10-10 ENCOUNTER — Emergency Department (HOSPITAL_COMMUNITY): Payer: Self-pay

## 2016-10-10 ENCOUNTER — Emergency Department (HOSPITAL_COMMUNITY)
Admission: EM | Admit: 2016-10-10 | Discharge: 2016-10-10 | Disposition: A | Discharge status: Home / Self Care | Payer: Self-pay | Attending: Emergency Medicine | Admitting: Emergency Medicine

## 2016-10-10 ENCOUNTER — Encounter (HOSPITAL_COMMUNITY): Payer: Self-pay

## 2016-10-10 DIAGNOSIS — F17210 Nicotine dependence, cigarettes, uncomplicated: Secondary | ICD-10-CM | POA: Insufficient documentation

## 2016-10-10 DIAGNOSIS — F1210 Cannabis abuse, uncomplicated: Secondary | ICD-10-CM | POA: Insufficient documentation

## 2016-10-10 DIAGNOSIS — G8929 Other chronic pain: Secondary | ICD-10-CM | POA: Insufficient documentation

## 2016-10-10 DIAGNOSIS — R52 Pain, unspecified: Secondary | ICD-10-CM

## 2016-10-10 DIAGNOSIS — I252 Old myocardial infarction: Secondary | ICD-10-CM | POA: Insufficient documentation

## 2016-10-10 DIAGNOSIS — I10 Essential (primary) hypertension: Secondary | ICD-10-CM | POA: Insufficient documentation

## 2016-10-10 DIAGNOSIS — F419 Anxiety disorder, unspecified: Secondary | ICD-10-CM | POA: Insufficient documentation

## 2016-10-10 DIAGNOSIS — Z8673 Personal history of transient ischemic attack (TIA), and cerebral infarction without residual deficits: Secondary | ICD-10-CM | POA: Insufficient documentation

## 2016-10-10 DIAGNOSIS — Z9049 Acquired absence of other specified parts of digestive tract: Secondary | ICD-10-CM | POA: Insufficient documentation

## 2016-10-10 DIAGNOSIS — F319 Bipolar disorder, unspecified: Secondary | ICD-10-CM | POA: Insufficient documentation

## 2016-10-10 DIAGNOSIS — Z590 Homelessness: Secondary | ICD-10-CM | POA: Insufficient documentation

## 2016-10-10 LAB — BASIC METABOLIC PANEL
Anion gap: 8 (ref 5–15)
BUN: 6 mg/dL (ref 6–20)
CALCIUM: 9.1 mg/dL (ref 8.9–10.3)
CO2: 28 mmol/L (ref 22–32)
CREATININE: 0.86 mg/dL (ref 0.61–1.24)
Chloride: 101 mmol/L (ref 101–111)
GFR calc Af Amer: >60 mL/min (ref >60)
GFR calc non Af Amer: >60 mL/min (ref >60)
GLUCOSE: 101 mg/dL — AB (ref 65–99)
Potassium: 3.6 mmol/L (ref 3.5–5.1)
Sodium: 137 mmol/L (ref 135–145)

## 2016-10-10 LAB — CBC
HCT: 43.6 % (ref 39.0–52.0)
Hemoglobin: 15.3 g/dL (ref 13.0–17.0)
MCH: 31.2 pg (ref 26.0–34.0)
MCHC: 35.1 g/dL (ref 30.0–36.0)
MCV: 88.8 fL (ref 78.0–100.0)
PLATELETS: 178 10*3/uL (ref 150–400)
RBC: 4.91 MIL/uL (ref 4.22–5.81)
RDW: 12.7 % (ref 11.5–15.5)
WBC: 9 10*3/uL (ref 4.0–10.5)

## 2016-10-10 LAB — I-STAT TROPONIN, ED: TROPONIN I, POC: 0 ng/mL (ref 0.00–0.08)

## 2016-10-10 MED ORDER — IBUPROFEN 400 MG PO TABS
600.0000 mg | ORAL_TABLET | Freq: Once | ORAL | Status: AC
  Administered 2016-10-10: 600 mg via ORAL
  Filled 2016-10-10: qty 1

## 2016-10-10 NOTE — ED Triage Notes (Signed)
Per [facility], Pt is coming from [facility] with a 9/10 headache that started last night. Complaining of bilateral leg and arm pain along with right flank pain. Hx of Psych and Alcohol. Pt denies alcohol today. Denies N/V/D. Vitals per EMS: 140/94, 98% on RA, 113 CBG.

## 2016-10-10 NOTE — ED Triage Notes (Signed)
Pt reports right sided chest pain that started last night as well with the headache along with bilateral leg pain and arm pain. Pt has right ear pain.

## 2016-10-10 NOTE — ED Provider Notes (Signed)
[facility] DEPT Provider Note   CSN: [id number] Arrival date & time: 10/10/16  1652     History   Chief Complaint Chief Complaint  Patient presents with  . Chest Pain  . Headache    HPI Dustin Hansen is a 46 y.o. male who is well-known to the emergency department who presents with pain everywhere. He reports that his entire bilateral upper and lower extremities hurt.  He additionally reports that the entirety of his bilateral arms, both the ears, his entire head, his entire chest, and his entire back all hurt.  He is unable to pinpoint any specific area where his pain is worse. He says that he has had Tylenol and ibuprofen in the past, however they do not work. He is requesting a shot for his pain today. He reports that he does not drink alcohol, denies any drug use.  He is homeless, asks for a sandwich, food, and a soda. During exam and discussions he asked   HPI  Past Medical History:  Diagnosis Date  . AAA (abdominal aortic aneurysm) (HCC) 10/2010   4.1cm on CT scan  . Anxiety   . Arthritis 11/08/2011   "feet and knees"  . Back pain, chronic   . Bipolar 1 disorder (HCC)   . Burning with urination 11/08/2011  . Chronic bronchitis (HCC) 11/08/2011   "get it q month"  . Daily headache 11/08/2011  . Depression   . GERD (gastroesophageal reflux disease)   . Hepatitis 11/08/2011   "not sure which one"  . History of stomach ulcers 11/08/2011  . Homelessness   . Hypertension   . Mental disorder 11/08/2011   "I get crazy; I take medicine"  . Myocardial infarction [facility])    "think so; when I was young; used to go to heart dr"  . Pneumonia   . Schizo-affective schizophrenia (HCC)   . Seizures (HCC) 11/08/2011   "jerking kind"  . Shortness of breath 11/08/2011   "lying down"  . Stroke (HCC) 11/08/2011   points to right chest    Patient Active Problem List   Diagnosis Date Noted  . Chronic pancreatitis (HCC) 11/14/2011  . Cannabis abuse 11/09/2011  . Hepatitis 11/08/2011  .  Hyperglycemia 11/08/2011  . Alcohol abuse 11/08/2011  . Transaminasemia 11/08/2011  . Elevated lipase 11/08/2011  . Chronic back pain 02/07/2011  . Insomnia 02/07/2011    Past Surgical History:  Procedure Laterality Date  . CHOLECYSTECTOMY  2008  . EUS  11/14/2011   Procedure: FULL UPPER ENDOSCOPIC ULTRASOUND (EUS) RADIAL;  Surgeon: Willis Modena, MD;  Location: [facility];  Service: Endoscopy;  Laterality: N/A;  to be carelinked from [facility]       Home Medications    Prior to Admission medications   Medication Sig Start Date End Date Taking? Authorizing Provider  acetaminophen (TYLENOL) 325 MG tablet Take 2 tablets (650 mg total) by mouth every 6 (six) hours as needed. 09/24/16   Derwood Kaplan, MD  cyclobenzaprine (FLEXERIL) 10 MG tablet Take 1 tablet (10 mg total) by mouth 2 (two) times daily as needed for muscle spasms. 09/24/16   Derwood Kaplan, MD  ibuprofen (ADVIL,MOTRIN) 600 MG tablet Take 1 tablet (600 mg total) by mouth every 6 (six) hours as needed. Patient not taking: Reported on 09/20/2016 07/08/16   [name], PA-C  methocarbamol (ROBAXIN) 500 MG tablet Take 1 tablet (500 mg total) by mouth 2 (two) times daily. Patient not taking: Reported on 09/20/2016 07/08/16   [name], PA-C  naproxen (  NAPROSYN) 500 MG tablet Take 1 tablet (500 mg total) by mouth 2 (two) times daily. Patient not taking: Reported on 06/21/2016 06/07/16   Liberty Handy, PA-C  oxyCODONE-acetaminophen (PERCOCET/ROXICET) 5-325 MG tablet Take 2 tablets by mouth every 6 (six) hours as needed for severe pain. Patient not taking: Reported on 06/21/2016 01/03/16   Tegeler, Canary Brim, MD  traMADol (ULTRAM) 50 MG tablet Take 1 tablet (50 mg total) by mouth every 12 (twelve) hours as needed for severe pain. Patient not taking: Reported on 06/21/2016 01/02/16   Danelle Berry, PA-C    Family History Family History  Problem Relation Age of Onset  . Hypertension Mother     Social History Social History    Substance Use Topics  . Smoking status: Current Every Day Smoker    Packs/day: 0.50    Years: 29.00    Types: Cigarettes  . Smokeless tobacco: Never Used  . Alcohol use Yes     Comment: 11/08/2011 "4-5 bottles beer/wk"     Allergies   Patient has no known allergies.   Review of Systems Review of Systems  Constitutional: Negative for chills and fever.  HENT: Negative for congestion, dental problem, ear pain, postnasal drip and sore throat.   Eyes: Negative for pain and visual disturbance.  Respiratory: Negative for cough and shortness of breath.   Cardiovascular: Positive for chest pain. Negative for palpitations.  Gastrointestinal: Negative for abdominal pain and vomiting.  Genitourinary: Negative for dysuria and hematuria.  Musculoskeletal: Positive for arthralgias, back pain, myalgias and neck pain.       "Everything hurts, bad pain everywhere"  Skin: Negative for color change and rash.  Neurological: Negative for seizures and syncope.  All other systems reviewed and are negative.    Physical Exam Updated Vital Signs BP (!) 186/88 (BP Location: Right Leg)   Pulse 71   Temp 98.3 F (36.8 C) (Oral)   Resp 16   Ht 4' 11.06" (1.5 m)   Wt 67.6 kg (149 lb)   SpO2 100%   BMI 30.04 kg/m   Physical Exam  Constitutional: He appears well-nourished.  Disheveled  HENT:  Head: Normocephalic and atraumatic. Head is without raccoon's eyes and without Battle's sign.  Right Ear: Tympanic membrane, external ear and ear canal normal.  Left Ear: Tympanic membrane, external ear and ear canal normal.  Nose: Nose normal.  Mouth/Throat: Uvula is midline, oropharynx is clear and moist and mucous membranes are normal. No oral lesions. No dental abscesses.  Eyes: Conjunctivae are normal.  Neck: Neck supple.  Cardiovascular: Normal rate, regular rhythm, normal heart sounds and intact distal pulses.  Exam reveals no gallop and no friction rub.   No murmur heard. Pulmonary/Chest: Effort  normal and breath sounds normal. No respiratory distress.  Abdominal: Soft. There is no tenderness.  Musculoskeletal: He exhibits no edema.  Entirety of back is painful, TTP, no obvious distinct midline tenderness, step-offs or deformities. No edema or contusions.  Entirety of right and left arms, hands, and shoulders are reportedly tender to palpation. All compartments in bilateral upper extremities are soft, hands are warm and well perfused.  Patient has intact motor and sensation function bilaterally. No obvious deformities edema or abnormalities.  Entirely of right and left upper and lower legs, knees, ankles, and feet are reportedly tender to palpation. All compartments in upper and lower legs bilaterally are soft. Bilateral feet are warm and well perfused with intact sensation and motor function.  No obvious deformities or edema, bruising  or abnormalities.  Neurological: He is alert.  Skin: Skin is warm and dry.  Psychiatric: His speech is normal.  Nursing note and vitals reviewed.    ED Treatments / Results  Labs (all labs ordered are listed, but only abnormal results are displayed) Labs Reviewed  BASIC METABOLIC PANEL - Abnormal; Notable for the following:       Result Value   Glucose, Bld 101 (*)    All other components within normal limits  CBC  I-STAT TROPONIN, ED    EKG  EKG Interpretation  Date/Time:  Tuesday October 10 2016 16:57:46 EDT Ventricular Rate:  65 PR Interval:  166 QRS Duration: 84 QT Interval:  398 QTC Calculation: 413 R Axis:   40 Text Interpretation:  Normal sinus rhythm Normal ECG similar to EKG in [place] 2008 Confirmed by JeffersonMackuen, Mitzi Hansenourteney ([id number]) on 10/10/2016 5:29:49 PM       Radiology Dg Chest 2 View  Result Date: 10/10/2016 CLINICAL DATA:  Chest pain EXAM: CHEST  2 VIEW COMPARISON:  09/20/2016 FINDINGS: Cardiac shadow is within normal limits. Old rib fractures with healing are noted on the right. No focal infiltrate or sizable effusion is  seen. No acute bony abnormality is noted. IMPRESSION: No active cardiopulmonary disease. Electronically Signed   By: Alcide CleverMark  Lukens M.D.   On: 10/10/2016 17:58    Procedures Procedures (including critical care time)  Medications Ordered in ED Medications - No data to display   Initial Impression / Assessment and Plan / ED Course  I have reviewed the triage vital signs and the nursing notes.  Pertinent labs & imaging results that were available during my care of the patient were reviewed by me and considered in my medical decision making (see chart for details).    Dustin IvanoffYung Holzheimer presents with body wide pain And has been seen in the emergency room for this multiple times before.  As he reports his entire body hurts and is unable to localize or pinpoint any one area of pain I have a very low suspicion for any acute injury. Patient requested narcotic shot which I do not feel is appropriate or needed at this time. Due to reported chest pain patient had baseline screening labs along with troponin, chest x-ray, and EKG all of which are without acute abnormalities. Physical exam without objective abnormalities.  Patient was given ibuprofen for his pain and instructed on over-the-counter and supportive pain measures.  Patient was instructed to follow-up with the wellness clinic regarding his multiple emergency room visits.   At this time there does not appear to be any evidence of an acute emergency medical condition and the patient appears stable for discharge with appropriate outpatient follow up.Diagnosis was discussed with patient who verbalizes understanding and is agreeable to discharge. Pt case discussed with Dr. Ethelda ChickJacubowitz who agrees with my plan.     Final Clinical Impressions(s) / ED Diagnoses   Final diagnoses:  Chronic generalized pain    New Prescriptions New Prescriptions   No medications on file     Norman ClayHammond, Elizabeth W, PA-C 10/12/16 0224    Doug SouJacubowitz, Sam, MD 10/13/16  1106

## 2016-10-10 NOTE — ED Notes (Signed)
Pt is in stable condition upon d/c and is given a sandwich prior to d/c. Pt ambulates from ED.

## 2016-10-10 NOTE — ED Provider Notes (Signed)
of right-sided parasternal chest pain onset 10:30 PM yesterday and also frontal headache gradual onset 10:30 PM yesterday. No other associated symptoms. No treatment prior to coming here. Nothing makes symptoms better or worse. Exam appears in no distress HEENT exam no facial asymmetry neck supple lungs clear auscultation heart regular rate and rhythm no murmurs chest is tender anteriorly reproducing pain exactly abdomen nondistended nontender extremities O edema. Skin warm dry Chest x-ray viewed by me   Doug SouJacubowitz, Naftali Carchi, MD 10/10/16 [phone]

## 2016-10-11 ENCOUNTER — Encounter (HOSPITAL_COMMUNITY): Payer: Self-pay

## 2016-10-11 ENCOUNTER — Emergency Department (HOSPITAL_COMMUNITY)
Admission: EM | Admit: 2016-10-11 | Discharge: 2016-10-11 | Disposition: A | Discharge status: Home / Self Care | Payer: Self-pay | Attending: Emergency Medicine | Admitting: Emergency Medicine

## 2016-10-11 DIAGNOSIS — K0889 Other specified disorders of teeth and supporting structures: Secondary | ICD-10-CM | POA: Insufficient documentation

## 2016-10-11 DIAGNOSIS — Z79899 Other long term (current) drug therapy: Secondary | ICD-10-CM | POA: Insufficient documentation

## 2016-10-11 DIAGNOSIS — I10 Essential (primary) hypertension: Secondary | ICD-10-CM | POA: Insufficient documentation

## 2016-10-11 DIAGNOSIS — G8929 Other chronic pain: Secondary | ICD-10-CM | POA: Insufficient documentation

## 2016-10-11 DIAGNOSIS — F17210 Nicotine dependence, cigarettes, uncomplicated: Secondary | ICD-10-CM | POA: Insufficient documentation

## 2016-10-11 DIAGNOSIS — K029 Dental caries, unspecified: Secondary | ICD-10-CM

## 2016-10-11 MED ORDER — AMOXICILLIN 500 MG PO CAPS
500.0000 mg | ORAL_CAPSULE | Freq: Once | ORAL | Status: AC
  Administered 2016-10-11: 500 mg via ORAL
  Filled 2016-10-11: qty 1

## 2016-10-11 MED ORDER — AMOXICILLIN 500 MG PO CAPS: 500.0000 mg | ORAL_CAPSULE | Freq: Three times a day (TID) | ORAL | 0 refills | Status: DC

## 2016-10-11 MED ORDER — HYDROCODONE-ACETAMINOPHEN 5-325 MG PO TABS
1.0000 | ORAL_TABLET | Freq: Once | ORAL | Status: AC
  Administered 2016-10-11: 1 via ORAL
  Filled 2016-10-11: qty 1

## 2016-10-11 MED ORDER — NAPROXEN 375 MG PO TABS: 375.0000 mg | ORAL_TABLET | Freq: Two times a day (BID) | ORAL | 0 refills | Status: DC

## 2016-10-11 NOTE — Discharge Instructions (Signed)
Follow-up with a dentist as soon as possible.

## 2016-10-11 NOTE — Care Management (Signed)
ED CM received consult concerning medication assistance. Reviewed record, no health insurance listed. Pt is eligible for MATCH. Discussed MATCH program and the guidelines including the  $3 co-pay per prescription. Patient is homeless and has 16 ED visits in the past 6 months and does not have the funds to afford the $3 co-pay at this time, the co- pay will be waived. Pt enrolled and MATCH letter printed and given to patient with list of participating pharmacies.  Pt verbalized understanding and is agreeable with plan.  [facility] and [facility] given. Pt voiced understanding and appreciation. Reminded patient of the importance of following up with PCP. Pt verbalized understanding teach back done. No further ED CM needs identified.

## 2016-10-11 NOTE — ED Notes (Signed)
Pt states he's unable to purchase his prescriptions because he has no money. NP notified.

## 2016-10-11 NOTE — ED Notes (Signed)
Pt departed in NAD, refused use of wheelchair.  

## 2016-10-11 NOTE — ED Triage Notes (Signed)
Pt here for ear and face pain on right side of face, sts "for too long and hurts a lot"

## 2016-10-11 NOTE — ED Notes (Addendum)
Pt given shower supplies, as well as a clean shirt and socks from the donated clothing closet. Pt taking shower presently. Sandwich meal given.

## 2016-10-11 NOTE — ED Provider Notes (Signed)
[facility] Provider Note   CSN: [id number] Arrival date & time: 10/11/16  2025  By signing my name below, I, Rosario Adie, attest that this documentation has been prepared under the direction and in the presence of [facility], [place].  Electronically Signed: Rosario Adie, ED Scribe. 10/11/16. 9:20 PM.  History   Chief Complaint Chief Complaint  Patient presents with  . Dental Pain  . Otalgia   The history is provided by the patient. No language interpreter was used.  Dental Pain   This is a recurrent problem. The current episode started 12 to 24 hours ago. The problem occurs constantly. The problem has been gradually worsening. The pain is at a severity of 9/10. The pain is severe. He has tried nothing for the symptoms. The treatment provided no relief.    HPI Comments: Dustin Hansen is a 46 y.o. male with frequent visits to the ED with the last visit being yesterday. Patient is homeless, with a hx of chronic pain and ETOH abuse and multiple other medical problems. He presents to the Emergency Department for the 14th time this month complaining of persistent, gradually worsening, right-sided, upper dental pain beginning today. Pt describes their pain as throbbing. Pt reports that he has a long standing h/o pain to the area; he notes that he has previously had extraction of several teeth d/t problems in the past. He also reports that his pain radiates into his right ear. No noted treatments for his symptoms were tried prior to coming into the ED. Pt's pain is exacerbated with chewing. They are not currently followed by a dental specialist. Pt denies facial swelling, fever, chills, or any other associated symptoms. Patient states he needs some strong pain medication.  Past Medical History:  Diagnosis Date  . AAA (abdominal aortic aneurysm) (HCC) 10/2010   4.1cm on CT scan  . Anxiety   . Arthritis 11/08/2011   "feet and knees"  . Back pain, chronic   . Bipolar 1 disorder  (HCC)   . Burning with urination 11/08/2011  . Chronic bronchitis (HCC) 11/08/2011   "get it q month"  . Daily headache 11/08/2011  . Depression   . GERD (gastroesophageal reflux disease)   . Hepatitis 11/08/2011   "not sure which one"  . History of stomach ulcers 11/08/2011  . Homelessness   . Hypertension   . Mental disorder 11/08/2011   "I get crazy; I take medicine"  . Myocardial infarction [facility])    "think so; when I was young; used to go to heart dr"  . Pneumonia   . Schizo-affective schizophrenia (HCC)   . Seizures (HCC) 11/08/2011   "jerking kind"  . Shortness of breath 11/08/2011   "lying down"  . Stroke (HCC) 11/08/2011   points to right chest   Patient Active Problem List   Diagnosis Date Noted  . Chronic pancreatitis (HCC) 11/14/2011  . Cannabis abuse 11/09/2011  . Hepatitis 11/08/2011  . Hyperglycemia 11/08/2011  . Alcohol abuse 11/08/2011  . Transaminasemia 11/08/2011  . Elevated lipase 11/08/2011  . Chronic back pain 02/07/2011  . Insomnia 02/07/2011   Past Surgical History:  Procedure Laterality Date  . CHOLECYSTECTOMY  2008  . EUS  11/14/2011   Procedure: FULL UPPER ENDOSCOPIC ULTRASOUND (EUS) RADIAL;  Surgeon: Willis Modena, MD;  Location: [facility];  Service: Endoscopy;  Laterality: N/A;  to be carelinked [facility]    Home Medications    Prior to Admission medications   Medication Sig Start Date End  Date Taking? Authorizing Provider  acetaminophen (TYLENOL) 325 MG tablet Take 2 tablets (650 mg total) by mouth every 6 (six) hours as needed. 09/24/16   Derwood KaplanNanavati, Ankit, MD  amoxicillin (AMOXIL) 500 MG capsule Take 1 capsule (500 mg total) by mouth 3 (three) times daily. 10/11/16   Janne NapoleonNeese, Armya Westerhoff M, NP  cyclobenzaprine (FLEXERIL) 10 MG tablet Take 1 tablet (10 mg total) by mouth 2 (two) times daily as needed for muscle spasms. 09/24/16   Derwood KaplanNanavati, Ankit, MD  methocarbamol (ROBAXIN) 500 MG tablet Take 1 tablet (500 mg total) by mouth 2 (two) times daily. Patient not  taking: Reported on 09/20/2016 07/08/16   Audry PiliMohr, Tyler, PA-C  naproxen (NAPROSYN) 375 MG tablet Take 1 tablet (375 mg total) by mouth 2 (two) times daily. 10/11/16   Janne NapoleonNeese, Myiah Petkus M, NP  oxyCODONE-acetaminophen (PERCOCET/ROXICET) 5-325 MG tablet Take 2 tablets by mouth every 6 (six) hours as needed for severe pain. Patient not taking: Reported on 06/21/2016 01/03/16   Tegeler, Canary Brimhristopher J, MD  traMADol (ULTRAM) 50 MG tablet Take 1 tablet (50 mg total) by mouth every 12 (twelve) hours as needed for severe pain. Patient not taking: Reported on 06/21/2016 01/02/16   Danelle Berryapia, Leisa, PA-C   Family History Family History  Problem Relation Age of Onset  . Hypertension Mother    Social History Social History  Substance Use Topics  . Smoking status: Current Every Day Smoker    Packs/day: 0.50    Years: 29.00    Types: Cigarettes  . Smokeless tobacco: Never Used  . Alcohol use Yes     Comment: 11/08/2011 "4-5 bottles beer/wk"   Allergies   Patient has no known allergies.  Review of Systems Review of Systems  Constitutional: Negative for chills and fever.  HENT: Positive for dental problem, ear pain and facial swelling. Negative for sore throat and trouble swallowing.   Respiratory: Negative for cough.   Cardiovascular: Negative for chest pain.  Gastrointestinal: Negative for abdominal pain, nausea and vomiting.  Skin: Negative for rash.   Physical Exam Updated Vital Signs BP (!) 147/96   Pulse 68   Temp 98.1 F (36.7 C)   Resp 14   SpO2 100%   Physical Exam  Constitutional: He appears well-developed and well-nourished. No distress.  HENT:  Right Ear: Tympanic membrane normal. No mastoid tenderness.  Left Ear: Tympanic membrane normal. No mastoid tenderness.  Mouth/Throat: Uvula is midline, oropharynx is clear and moist and mucous membranes are normal. Abnormal dentition. No posterior oropharyngeal edema or posterior oropharyngeal erythema.    Right upper third molar is broken and  decayed. There is swelling surrounding the tooth. There is anterior cervical LAD. There is bruising and ecchymosis to the right external ear.   Eyes: EOM are normal. No scleral icterus.  Neck: Normal range of motion. Neck supple.  Cardiovascular: Normal rate and regular rhythm.   Pulmonary/Chest: Effort normal and breath sounds normal.  Abdominal: He exhibits no distension.  Musculoskeletal: Normal range of motion.  Lymphadenopathy:    He has cervical adenopathy.  Neurological: He is alert.  Skin: Skin is warm and dry. No pallor.  Nursing note and vitals reviewed.  ED Treatments / Results  DIAGNOSTIC STUDIES: Oxygen Saturation is 100% on RA, normal by my interpretation.   COORDINATION OF CARE: 9:20 PM-Discussed next steps with pt. Pt verbalized understanding and is agreeable with the plan.   Labs (all labs ordered are listed, but only abnormal results are displayed) Labs Reviewed - No data to display  Radiology Dg Chest 2 View  Result Date: 10/10/2016 CLINICAL DATA:  Chest pain EXAM: CHEST  2 VIEW COMPARISON:  09/20/2016 FINDINGS: Cardiac shadow is within normal limits. Old rib fractures with healing are noted on the right. No focal infiltrate or sizable effusion is seen. No acute bony abnormality is noted. IMPRESSION: No active cardiopulmonary disease. Electronically Signed   By: Alcide CleverMark  Lukens M.D.   On: 10/10/2016 17:58   Procedures Procedures   Medications Ordered in ED Medications  HYDROcodone-acetaminophen (NORCO/VICODIN) 5-325 MG per tablet 1 tablet (1 tablet Oral Given 10/11/16 2145)  amoxicillin (AMOXIL) capsule 500 mg (500 mg Oral Given 10/11/16 2145)   Initial Impression / Assessment and Plan / ED Course  I have reviewed the triage vital signs and the nursing notes.  Patient with dentalgia.  No abscess requiring immediate incision and drainage.  Exam not concerning for Ludwig's angina or pharyngeal abscess. Pain was managed while in the ED. Will treat with  Amoxicillin. Pt instructed to follow-up with dentist.  Discussed return precautions. Pt safe for discharge. Case manager in to see the patient and arrange follow up and assure patient gets his medication.   Final Clinical Impressions(s) / ED Diagnoses   Final diagnoses:  Pain due to dental caries   New Prescriptions Discharge Medication List as of 10/11/2016  9:43 PM    START taking these medications   Details  amoxicillin (AMOXIL) 500 MG capsule Take 1 capsule (500 mg total) by mouth 3 (three) times daily., Starting Wed 10/11/2016, Print      I personally performed the services described in this documentation, which was scribed in my presence. The recorded information has been reviewed and is accurate.    Kerrie Buffaloeese, Holleigh Crihfield RipleyM, [place]NP 10/12/16 [id number]    Arby BarrettePfeiffer, Marcy, MD 10/16/16 1259

## 2016-10-12 ENCOUNTER — Encounter (HOSPITAL_COMMUNITY): Payer: Self-pay | Admitting: Emergency Medicine

## 2016-10-12 DIAGNOSIS — G8929 Other chronic pain: Secondary | ICD-10-CM | POA: Insufficient documentation

## 2016-10-12 DIAGNOSIS — Z590 Homelessness: Secondary | ICD-10-CM | POA: Insufficient documentation

## 2016-10-12 DIAGNOSIS — K0889 Other specified disorders of teeth and supporting structures: Secondary | ICD-10-CM | POA: Insufficient documentation

## 2016-10-12 DIAGNOSIS — K029 Dental caries, unspecified: Secondary | ICD-10-CM | POA: Insufficient documentation

## 2016-10-12 DIAGNOSIS — F17210 Nicotine dependence, cigarettes, uncomplicated: Secondary | ICD-10-CM | POA: Insufficient documentation

## 2016-10-12 DIAGNOSIS — I10 Essential (primary) hypertension: Secondary | ICD-10-CM | POA: Insufficient documentation

## 2016-10-12 NOTE — ED Triage Notes (Signed)
Pt presents with [facility] for pain all over and dental pain on the right side

## 2016-10-13 ENCOUNTER — Emergency Department (HOSPITAL_COMMUNITY)
Admission: EM | Admit: 2016-10-13 | Discharge: 2016-10-13 | Disposition: A | Discharge status: Home / Self Care | Payer: Self-pay | Attending: Emergency Medicine | Admitting: Emergency Medicine

## 2016-10-13 ENCOUNTER — Emergency Department (HOSPITAL_COMMUNITY): Payer: Self-pay

## 2016-10-13 ENCOUNTER — Encounter (HOSPITAL_COMMUNITY): Payer: Self-pay | Admitting: *Deleted

## 2016-10-13 DIAGNOSIS — R079 Chest pain, unspecified: Secondary | ICD-10-CM | POA: Insufficient documentation

## 2016-10-13 DIAGNOSIS — Z8673 Personal history of transient ischemic attack (TIA), and cerebral infarction without residual deficits: Secondary | ICD-10-CM | POA: Insufficient documentation

## 2016-10-13 DIAGNOSIS — G8929 Other chronic pain: Secondary | ICD-10-CM

## 2016-10-13 DIAGNOSIS — R52 Pain, unspecified: Secondary | ICD-10-CM

## 2016-10-13 DIAGNOSIS — Z5900 Homelessness unspecified: Secondary | ICD-10-CM

## 2016-10-13 DIAGNOSIS — K029 Dental caries, unspecified: Secondary | ICD-10-CM

## 2016-10-13 DIAGNOSIS — F17210 Nicotine dependence, cigarettes, uncomplicated: Secondary | ICD-10-CM | POA: Insufficient documentation

## 2016-10-13 DIAGNOSIS — I10 Essential (primary) hypertension: Secondary | ICD-10-CM | POA: Insufficient documentation

## 2016-10-13 HISTORY — DX: Thoracic aortic aneurysm, without rupture: I71.2

## 2016-10-13 LAB — COMPREHENSIVE METABOLIC PANEL
ALBUMIN: 3.8 g/dL (ref 3.5–5.0)
ALK PHOS: 53 U/L (ref 38–126)
ALT: 22 U/L (ref 17–63)
AST: 36 U/L (ref 15–41)
Anion gap: 7 (ref 5–15)
BILIRUBIN TOTAL: 1.3 mg/dL — AB (ref 0.3–1.2)
BUN: 14 mg/dL (ref 6–20)
CALCIUM: 9.1 mg/dL (ref 8.9–10.3)
CO2: 26 mmol/L (ref 22–32)
Chloride: 104 mmol/L (ref 101–111)
Creatinine, Ser: 0.87 mg/dL (ref 0.61–1.24)
GFR calc Af Amer: >60 mL/min (ref >60)
GFR calc non Af Amer: >60 mL/min (ref >60)
GLUCOSE: 90 mg/dL (ref 65–99)
POTASSIUM: 3.8 mmol/L (ref 3.5–5.1)
SODIUM: 137 mmol/L (ref 135–145)
TOTAL PROTEIN: 6.5 g/dL (ref 6.5–8.1)

## 2016-10-13 LAB — I-STAT TROPONIN, ED
TROPONIN I, POC: 0 ng/mL (ref 0.00–0.08)
Troponin i, poc: 0 ng/mL (ref 0.00–0.08)

## 2016-10-13 LAB — CBC WITH DIFFERENTIAL/PLATELET
BASOS PCT: 0 %
Basophils Absolute: 0 10*3/uL (ref 0.0–0.1)
Eosinophils Absolute: 0.1 10*3/uL (ref 0.0–0.7)
Eosinophils Relative: 1 %
HEMATOCRIT: 41.1 % (ref 39.0–52.0)
HEMOGLOBIN: 14.1 g/dL (ref 13.0–17.0)
Lymphocytes Relative: 29 %
Lymphs Abs: 2.5 10*3/uL (ref 0.7–4.0)
MCH: 30.7 pg (ref 26.0–34.0)
MCHC: 34.3 g/dL (ref 30.0–36.0)
MCV: 89.5 fL (ref 78.0–100.0)
MONO ABS: 0.6 10*3/uL (ref 0.1–1.0)
MONOS PCT: 7 %
NEUTROS ABS: 5.1 10*3/uL (ref 1.7–7.7)
Neutrophils Relative %: 63 %
Platelets: 139 10*3/uL — ABNORMAL LOW (ref 150–400)
RBC: 4.59 MIL/uL (ref 4.22–5.81)
RDW: 12.8 % (ref 11.5–15.5)
WBC: 8.3 10*3/uL (ref 4.0–10.5)

## 2016-10-13 LAB — D-DIMER, QUANTITATIVE: D-Dimer, Quant: 0.39 ug/mL-FEU (ref 0.00–0.50)

## 2016-10-13 LAB — ETHANOL: Alcohol, Ethyl (B): <5 mg/dL (ref <5)

## 2016-10-13 MED ORDER — MORPHINE SULFATE (PF) 4 MG/ML IV SOLN
4.0000 mg | Freq: Once | INTRAVENOUS | Status: AC
  Administered 2016-10-13: 4 mg via INTRAVENOUS
  Filled 2016-10-13: qty 1

## 2016-10-13 MED ORDER — KETOROLAC TROMETHAMINE 30 MG/ML IJ SOLN
30.0000 mg | Freq: Once | INTRAMUSCULAR | Status: AC
  Administered 2016-10-13: 30 mg via INTRAVENOUS
  Filled 2016-10-13: qty 1

## 2016-10-13 MED ORDER — ONDANSETRON HCL 4 MG/2ML IJ SOLN
4.0000 mg | Freq: Once | INTRAMUSCULAR | Status: AC
  Administered 2016-10-13: 4 mg via INTRAVENOUS
  Filled 2016-10-13: qty 2

## 2016-10-13 NOTE — Discharge Instructions (Signed)
Apply warm compresses to areas of pain throughout the day. Take the antibiotic given to you yesterday as directed and until finished. Alternate between tylenol and motrin as needed for pain. Perform salt water swishes to help with pain/swelling. Use over the counter oragel as needed for additional relief. Follow up with the [facility] in 1-2 weeks for recheck of symptoms and to establish medical care. Return to emergency department for emergent changing or worsening symptoms.

## 2016-10-13 NOTE — ED Triage Notes (Signed)
[facility], per report c/o generalized boday aches & SOB, pt NAD,O2 sats on RA, pt ambulatory, speaks in full sentences, A&O x4

## 2016-10-13 NOTE — ED Notes (Signed)
Pt found sleeping in waiting room. Ambulates to rioom with NAD.

## 2016-10-13 NOTE — ED Notes (Signed)
Pt called in waiting room without answer.

## 2016-10-13 NOTE — ED Provider Notes (Signed)
[facility] DEPT Provider Note   CSN: [id number] Arrival date & time: 10/13/16  1335     History   Chief Complaint Chief Complaint  Patient presents with  . Shortness of Breath    HPI Dustin Hansen is a 46 y.o. male.  A language interpreter was used [place]namese).    Dustin Hansen is a 46 y.o. male, with a history of Thoracic aortic aneurysm, alcohol abuse, HTN, frequent ED visits, presenting to the ED with right sided chest pain beginning around 10:30AM yesterday while he was walking around. States the pain is fairly constant. Pain is worse with lying supine. Pain is in right chest, rated 9/10, vague description, and no specific radiation as he states it "moves all over." Also complains of generalized body aches. Patient was seen early this morning, however, his focus at that time seems to have been more on his dental pain.  Denies fever/chills, N/V/D, increased cough, shortness of breath, peripheral edema, or any other complaints.  Homeless. States he has not followed any of the discharge advice from previous ED visits. States he can not afford any prescriptions. Denies drug/alcohol use.    Past Medical History:  Diagnosis Date  . Anxiety   . Arthritis 11/08/2011   "feet and knees"  . Back pain, chronic   . Bipolar 1 disorder (HCC)   . Burning with urination 11/08/2011  . Chronic bronchitis (HCC) 11/08/2011   "get it q month"  . Daily headache 11/08/2011  . Depression   . GERD (gastroesophageal reflux disease)   . Hepatitis 11/08/2011   "not sure which one"  . History of stomach ulcers 11/08/2011  . Homelessness   . Hypertension   . Mental disorder 11/08/2011   "I get crazy; I take medicine"  . Myocardial infarction [facility](HCC)    "think so; when I was young; used to go to heart dr"  . Pneumonia   . Schizo-affective schizophrenia (HCC)   . Seizures (HCC) 11/08/2011   "jerking kind"  . Shortness of breath 11/08/2011   "lying down"  . Stroke (HCC) 11/08/2011   points to right chest   . Thoracic aortic aneurysm [facility](HCC) 2012   4.4cm in October 2017    Patient Active Problem List   Diagnosis Date Noted  . Chronic pancreatitis (HCC) 11/14/2011  . Cannabis abuse 11/09/2011  . Hepatitis 11/08/2011  . Hyperglycemia 11/08/2011  . Alcohol abuse 11/08/2011  . Transaminasemia 11/08/2011  . Elevated lipase 11/08/2011  . Chronic back pain 02/07/2011  . Insomnia 02/07/2011    Past Surgical History:  Procedure Laterality Date  . CHOLECYSTECTOMY  2008  . EUS  11/14/2011   Procedure: FULL UPPER ENDOSCOPIC ULTRASOUND (EUS) RADIAL;  Surgeon: Willis ModenaWilliam Outlaw, MD;  Location: [facility] ENDOSCOPY;  Service: Endoscopy;  Laterality: N/A;  [facility]       Home Medications    Prior to Admission medications   Medication Sig Start Date End Date Taking? Authorizing Provider  acetaminophen (TYLENOL) 325 MG tablet Take 2 tablets (650 mg total) by mouth every 6 (six) hours as needed. Patient not taking: Reported on 10/13/2016 09/24/16   Derwood KaplanNanavati, Ankit, MD  amoxicillin (AMOXIL) 500 MG capsule Take 1 capsule (500 mg total) by mouth 3 (three) times daily. Patient not taking: Reported on 10/13/2016 10/11/16   Janne NapoleonNeese, Hope M, NP  cyclobenzaprine (FLEXERIL) 10 MG tablet Take 1 tablet (10 mg total) by mouth 2 (two) times daily as needed for muscle spasms. Patient not taking: Reported on 10/13/2016 09/24/16  Derwood KaplanNanavati, Ankit, MD  methocarbamol (ROBAXIN) 500 MG tablet Take 1 tablet (500 mg total) by mouth 2 (two) times daily. Patient not taking: Reported on 09/20/2016 07/08/16   Audry PiliMohr, Tyler, PA-C  naproxen (NAPROSYN) 375 MG tablet Take 1 tablet (375 mg total) by mouth 2 (two) times daily. Patient not taking: Reported on 10/13/2016 10/11/16   Janne NapoleonNeese, Hope M, NP  oxyCODONE-acetaminophen (PERCOCET/ROXICET) 5-325 MG tablet Take 2 tablets by mouth every 6 (six) hours as needed for severe pain. Patient not taking: Reported on 10/13/2016 01/03/16   Tegeler, Canary Brimhristopher J, MD  traMADol (ULTRAM) 50 MG tablet  Take 1 tablet (50 mg total) by mouth every 12 (twelve) hours as needed for severe pain. Patient not taking: Reported on 06/21/2016 01/02/16   Danelle Berryapia, Leisa, PA-C    Family History Family History  Problem Relation Age of Onset  . Hypertension Mother     Social History Social History  Substance Use Topics  . Smoking status: Current Every Day Smoker    Packs/day: 0.50    Years: 29.00    Types: Cigarettes  . Smokeless tobacco: Never Used  . Alcohol use Yes     Comment: 11/08/2011 "4-5 bottles beer/wk"     Allergies   Patient has no known allergies.   Review of Systems Review of Systems  Constitutional: Negative for chills, diaphoresis and fever.  Respiratory: Negative for cough and shortness of breath.   Cardiovascular: Positive for chest pain. Negative for leg swelling.  Gastrointestinal: Negative for abdominal pain, constipation, diarrhea, nausea and vomiting.  Musculoskeletal: Positive for myalgias.  Neurological: Negative for dizziness, syncope, weakness, light-headedness, numbness and headaches.  All other systems reviewed and are negative.    Physical Exam Updated Vital Signs BP (!) 127/96 (BP Location: Left Arm)   Pulse 66   Temp 98 F (36.7 C) (Oral)   Resp 20   SpO2 97%   Physical Exam  Constitutional: He appears well-developed and well-nourished. No distress.  HENT:  Head: Normocephalic and atraumatic.  Eyes: Pupils are equal, round, and reactive to light. Conjunctivae and EOM are normal.  Neck: Normal range of motion. Neck supple.  Cardiovascular: Normal rate, regular rhythm, normal heart sounds and intact distal pulses.   Patient's pulses are equal in the extremities bilaterally.  Pulmonary/Chest: Effort normal and breath sounds normal. No respiratory distress. He exhibits tenderness (Across anterior right chest).  Shows no increased work of breathing. Speaks in full sentences without difficulty.  Abdominal: Soft. There is no tenderness. There is no  guarding.  Musculoskeletal: He exhibits no edema.  Lymphadenopathy:    He has no cervical adenopathy.  Neurological: He is alert.  Skin: Skin is warm and dry. He is not diaphoretic.  Psychiatric: He has a normal mood and affect. His behavior is normal.  Nursing note and vitals reviewed.    ED Treatments / Results  Labs (all labs ordered are listed, but only abnormal results are displayed) Labs Reviewed  COMPREHENSIVE METABOLIC PANEL - Abnormal; Notable for the following:       Result Value   Total Bilirubin 1.3 (*)    All other components within normal limits  CBC WITH DIFFERENTIAL/PLATELET - Abnormal; Notable for the following:    Platelets 139 (*)    All other components within normal limits  ETHANOL  D-DIMER, QUANTITATIVE (NOT AT [facility])  I-STAT TROPONIN, ED  I-STAT TROPONIN, ED    EKG  EKG Interpretation None      EKG would not cross over, but shows a  sinus rhythm with no change from last tracing.  Radiology Dg Chest 2 View  Result Date: 10/13/2016 CLINICAL DATA:  Full body aches and chest pain. Pain on inspiration. EXAM: CHEST  2 VIEW COMPARISON:  10/10/2016 FINDINGS: Normal heart size and pulmonary vascularity. No focal airspace disease or consolidation in the lungs. No blunting of costophrenic angles. No pneumothorax. Mediastinal contours appear intact. Old right rib fractures. IMPRESSION: No active cardiopulmonary disease. Electronically Signed   By: Burman Nieves M.D.   On: 10/13/2016 19:41    Procedures Procedures (including critical care time)  Medications Ordered in ED Medications  morphine 4 MG/ML injection 4 mg (4 mg Intravenous Given 10/13/16 1904)  ondansetron (ZOFRAN) injection 4 mg (4 mg Intravenous Given 10/13/16 1859)  ketorolac (TORADOL) 30 MG/ML injection 30 mg (30 mg Intravenous Given 10/13/16 2102)     Initial Impression / Assessment and Plan / ED Course  I have reviewed the triage vital signs and the nursing notes.  Pertinent labs &  imaging results that were available during my care of the patient were reviewed by me and considered in my medical decision making (see chart for details).  Clinical Course as of Oct 14 140  Fri Oct 13, 2016  1658 Patient not in room upon initial contact attempt.  [SJ]    Clinical Course User Index [SJ] Coner Gibbard C, PA-C    Patient presents with chest pain and body aches. Low suspicion for ACS. HEART score is 3, indicating low risk for a cardiac event. Wells criteria score is 0, indicating low risk for PE. Patient is nontoxic appearing, afebrile, not tachycardic, not tachypneic, not hypotensive, maintains adequate SPO2 on room air, and is in no apparent distress. Patient has no signs of sepsis or other serious or life-threatening condition. Low suspicion for thoracic aortic dissection. D-dimer negative. Delta troponins negative. Normal chest x-ray. Equal pulses bilaterally in the extremities. Through most of his ED course patient was most concerned with when he could leave and if he could have a soda and crackers. Patient was given multiple resources for help with medications and securing PCP care. This is been done for him in the past. The patient was given instructions for home care as well as return precautions. Patient voices understanding of these instructions, accepts the plan, and is comfortable with discharge.   Findings and plan of care discussed with Cathi Roan, MD.   Vitals:   10/13/16 1800 10/13/16 1815 10/13/16 1830 10/13/16 1845  BP: (!) 126/92 (!) 151/104 (!) 137/91 (!) 128/92  Pulse: 66 74 67 65  Resp: 17 (!) 21 17 19   Temp:      TempSrc:      SpO2: 96% 97% 95% 96%    Final Clinical Impressions(s) / ED Diagnoses   Final diagnoses:  Chest pain, unspecified type  Body aches    New Prescriptions Discharge Medication List as of 10/13/2016  9:26 PM       Anselm Pancoast, PA-C 10/14/16 0142    Maia Plan, MD 10/14/16 1347

## 2016-10-13 NOTE — ED Provider Notes (Signed)
[facility] Provider Note   CSN: [id number] Arrival date & time: 10/12/16  2306     History   Chief Complaint Chief Complaint  Patient presents with  . Generalized Body Aches  . Dental Pain    HPI Dustin Hansen is a 46 y.o. male with a PMHx of chronic pain, headaches, GERD, hepatitis, homelessness, HTN, and other medical/psychiatric conditions listed below, well known to the ED here with multiple frequent visits, who presents to the ED with complaints of mild constant unchanged generalized pain everywhere as well as right dental pain. He initially states that the pain started yesterday, however when asked about the fact that he was seen in the ED yesterday, he then states the pain has been ongoing for a while. He cannot focalize one area of pain, but states this is pain that is chronic for him. The only focal area he mentions is R dental pain. Chart review reveals he was seen in the ED on 10/11/16 for R dental pain, was discharged with amoxicillin rx which he states he didn't fill because he has no money; he then pulls out a new-appearing cell phone and asks that I charge it for him. He is actively eating during the evaluation. He has not tried anything for his symptoms, and denies any aggravating factors. He does not have a dentist nor does he have a PCP. He denies any fevers, chills, mouth or face swelling, CP, SOB, N/V/D/C, urinary symptoms, or any other complaints at this time.   The history is provided by the patient and medical records. No language interpreter was used.  Dental Pain   This is a chronic problem. The current episode started more than 1 week ago. The problem occurs constantly. The problem has not changed since onset.The pain is mild. He has tried nothing for the symptoms. The treatment provided no relief.    Past Medical History:  Diagnosis Date  . AAA (abdominal aortic aneurysm) (HCC) 10/2010   4.1cm on CT scan  . Anxiety   . Arthritis 11/08/2011   "feet and  knees"  . Back pain, chronic   . Bipolar 1 disorder (HCC)   . Burning with urination 11/08/2011  . Chronic bronchitis (HCC) 11/08/2011   "get it q month"  . Daily headache 11/08/2011  . Depression   . GERD (gastroesophageal reflux disease)   . Hepatitis 11/08/2011   "not sure which one"  . History of stomach ulcers 11/08/2011  . Homelessness   . Hypertension   . Mental disorder 11/08/2011   "I get crazy; I take medicine"  . Myocardial infarction [facility])    "think so; when I was young; used to go to heart dr"  . Pneumonia   . Schizo-affective schizophrenia (HCC)   . Seizures (HCC) 11/08/2011   "jerking kind"  . Shortness of breath 11/08/2011   "lying down"  . Stroke (HCC) 11/08/2011   points to right chest    Patient Active Problem List   Diagnosis Date Noted  . Chronic pancreatitis (HCC) 11/14/2011  . Cannabis abuse 11/09/2011  . Hepatitis 11/08/2011  . Hyperglycemia 11/08/2011  . Alcohol abuse 11/08/2011  . Transaminasemia 11/08/2011  . Elevated lipase 11/08/2011  . Chronic back pain 02/07/2011  . Insomnia 02/07/2011    Past Surgical History:  Procedure Laterality Date  . CHOLECYSTECTOMY  2008  . EUS  11/14/2011   Procedure: FULL UPPER ENDOSCOPIC ULTRASOUND (EUS) RADIAL;  Surgeon: Willis Modena, MD;  Location: [facility] ENDOS[facility];  Service: Endoscopy;  Laterality: N/A;  [facility]       Home Medications    Prior to Admission medications   Medication Sig Start Date End Date Taking? Authorizing Provider  acetaminophen (TYLENOL) 325 MG tablet Take 2 tablets (650 mg total) by mouth every 6 (six) hours as needed. 09/24/16   Derwood KaplanNanavati, Ankit, MD  amoxicillin (AMOXIL) 500 MG capsule Take 1 capsule (500 mg total) by mouth 3 (three) times daily. 10/11/16   Janne NapoleonNeese, Hope M, NP  cyclobenzaprine (FLEXERIL) 10 MG tablet Take 1 tablet (10 mg total) by mouth 2 (two) times daily as needed for muscle spasms. 09/24/16   Derwood KaplanNanavati, Ankit, MD  methocarbamol (ROBAXIN) 500 MG tablet Take 1  tablet (500 mg total) by mouth 2 (two) times daily. Patient not taking: Reported on 09/20/2016 07/08/16   Audry PiliMohr, Tyler, PA-C  naproxen (NAPROSYN) 375 MG tablet Take 1 tablet (375 mg total) by mouth 2 (two) times daily. 10/11/16   Janne NapoleonNeese, Hope M, NP  oxyCODONE-acetaminophen (PERCOCET/ROXICET) 5-325 MG tablet Take 2 tablets by mouth every 6 (six) hours as needed for severe pain. Patient not taking: Reported on 06/21/2016 01/03/16   Tegeler, Canary Brimhristopher J, MD  traMADol (ULTRAM) 50 MG tablet Take 1 tablet (50 mg total) by mouth every 12 (twelve) hours as needed for severe pain. Patient not taking: Reported on 06/21/2016 01/02/16   Danelle Berryapia, Leisa, PA-C    Family History Family History  Problem Relation Age of Onset  . Hypertension Mother     Social History Social History  Substance Use Topics  . Smoking status: Current Every Day Smoker    Packs/day: 0.50    Years: 29.00    Types: Cigarettes  . Smokeless tobacco: Never Used  . Alcohol use Yes     Comment: 11/08/2011 "4-5 bottles beer/wk"     Allergies   Patient has no known allergies.   Review of Systems Review of Systems  Constitutional: Negative for chills and fever.  HENT: Positive for dental problem. Negative for facial swelling and trouble swallowing.   Respiratory: Negative for shortness of breath.   Cardiovascular: Negative for chest pain.  Gastrointestinal: Negative for constipation, diarrhea, nausea and vomiting.  Genitourinary: Negative for dysuria and hematuria.  Musculoskeletal: Positive for myalgias (all over body pain).  Allergic/Immunologic: Negative for immunocompromised state.   All other systems reviewed and are negative for acute change except as noted in the HPI.    Physical Exam Updated Vital Signs BP (!) 157/107 (BP Location: Left Arm)   Pulse 93   Temp 98.7 F (37.1 C) (Oral)   Resp 18   SpO2 99%   Physical Exam  Constitutional: He is oriented to person, place, and time. Vital signs are normal. He appears  well-developed and well-nourished.  Non-toxic appearance. No distress.  Afebrile, nontoxic, NAD, requesting that his chair be reclined, then using the chair next to him to prop his feet up; poor hygiene, disheveled appearance, chewing food during evaluation, asking that we charge his new-appearing cell phone  HENT:  Head: Normocephalic and atraumatic.  Mouth/Throat: Uvula is midline, oropharynx is clear and moist and mucous membranes are normal. No trismus in the jaw. Dental caries present. No dental abscesses or uvula swelling. Tonsils are 0 on the right. Tonsils are 0 on the left. No tonsillar exudate.  Diffuse dental decay, multiple missing teeth, no dental abscesses or gingival swelling/erythema noted, no evidence of ludwig's. Chewing food during evaluation. Oropharynx clear and moist, without uvular swelling or deviation, no trismus or drooling, no  tonsillar swelling or erythema, no exudates.    Eyes: Conjunctivae and EOM are normal. Right eye exhibits no discharge. Left eye exhibits no discharge.  Neck: Normal range of motion. Neck supple.  Cardiovascular: Normal rate, regular rhythm, normal heart sounds and intact distal pulses.  Exam reveals no gallop and no friction rub.   No murmur heard. Pulmonary/Chest: Effort normal and breath sounds normal. No respiratory distress. He has no decreased breath sounds. He has no wheezes. He has no rhonchi. He has no rales.  Abdominal: Soft. Normal appearance and bowel sounds are normal. He exhibits no distension. There is no tenderness. There is no rigidity, no rebound, no guarding, no CVA tenderness, no tenderness at McBurney's point and negative Murphy's sign.  Soft, NTND, +BS throughout, no r/g/r, neg murphy's, neg mcburney's, no CVA TTP   Musculoskeletal: Normal range of motion.  MAE x4 Strength and sensation grossly intact in all extremities Distal pulses intact Gait steady No focal area of pain on exam  Neurological: He is alert and oriented to  person, place, and time. He has normal strength. No sensory deficit.  Skin: Skin is warm, dry and intact. No rash noted.  Psychiatric: He has a normal mood and affect.  Nursing note and vitals reviewed.    ED Treatments / Results  Labs (all labs ordered are listed, but only abnormal results are displayed) Labs Reviewed - No data to display  EKG  EKG Interpretation None       Radiology No results found.  Procedures Procedures (including critical care time)  Medications Ordered in ED Medications - No data to display   Initial Impression / Assessment and Plan / ED Course  I have reviewed the triage vital signs and the nursing notes.  Pertinent labs & imaging results that were available during my care of the patient were reviewed by me and considered in my medical decision making (see chart for details).     46 y.o. male here for generalized pain everywhere and R dental pain; has been seen multiple times for same complaints, comes in regularly for chronic pain complaints; was last seen on 10/11/16 and given rx for amoxicillin which he states he didn't fill because he has no money; he then grabs a new-appearing cell phone from his pocket and asks for me to charge it. On exam, diffuse dental decay, pt actively eating food during exam, no dental abscess noted; handling secretions well; no focal area of pain over remainder of body. Pt asking that I recline his chair, and then propping his feet up on a chair next to him. Disheveled and with poor hygiene. I stressed the importance of him using the rx from yesterday, which should be $4 at [facility], and use of OTC remedies for symptomatic relief. Advised f/up with [facility] to establish care and for recheck of his chronic complaints. Doubt need for further emergent work up at this time. I explained the diagnosis and have given explicit precautions to return to the ER including for any other new or worsening symptoms. The patient understands and  accepts the medical plan as it's been dictated and I have answered their questions. Discharge instructions concerning home care and prescriptions have been given. The patient is STABLE and is discharged to home in good condition.    Final Clinical Impressions(s) / ED Diagnoses   Final diagnoses:  Other chronic pain  Pain due to dental caries  Homelessness    New Prescriptions New Prescriptions   No medications on file  [address], [place], [place] 10/13/16 0215    Gilda CreasePollina, Christopher J, MD 10/13/16 [phone]

## 2016-10-13 NOTE — ED Notes (Signed)
Patient transported to X-ray 

## 2016-10-13 NOTE — Discharge Instructions (Signed)
Your labs and imaging results were encouraging today. Please follow up with a primary care provider. A recommendation was included in this paperwork.  You also have a thoracic aneurysm that was found on a previous CT scan. An information sheet has been included on this matter. It is recommended that this be followed regularly by a vascular surgeon. A suggestion has been included in this paperwork for you. You should call the number provided to set up an appointment.   May take ibuprofen, naproxen, or Tylenol for your current symptoms. Get plenty of rest and stay well-hydrated.  [facility], located in [place], can give you assistance for anything from medical follow-up, prescriptions, and assistance with other helpful tasks.

## 2016-10-13 NOTE — ED Notes (Signed)
Pt attempted to leave dept several times with IV in place.  Pt redirected to room. Went over discharge instructions but pt refused signature or discharge vital signs.  Pt standing in hall, IV removed,

## 2016-10-13 NOTE — ED Notes (Signed)
Pt left without receiving discharge paperwork or discharge vitals. Pt ambulatory around the department.

## 2016-10-14 ENCOUNTER — Encounter (HOSPITAL_COMMUNITY): Payer: Self-pay

## 2016-10-14 ENCOUNTER — Other Ambulatory Visit: Payer: Self-pay

## 2016-10-14 ENCOUNTER — Encounter (HOSPITAL_COMMUNITY): Payer: Self-pay | Admitting: Emergency Medicine

## 2016-10-14 ENCOUNTER — Emergency Department (HOSPITAL_COMMUNITY)
Admission: EM | Admit: 2016-10-14 | Discharge: 2016-10-14 | Disposition: A | Discharge status: Home / Self Care | Payer: Self-pay | Attending: Physician Assistant | Admitting: Physician Assistant

## 2016-10-14 ENCOUNTER — Emergency Department (HOSPITAL_COMMUNITY)
Admission: EM | Admit: 2016-10-14 | Discharge: 2016-10-14 | Discharge status: Against Medical Advice | Payer: Self-pay | Attending: Emergency Medicine | Admitting: Emergency Medicine

## 2016-10-14 ENCOUNTER — Emergency Department (HOSPITAL_COMMUNITY)
Admission: EM | Admit: 2016-10-14 | Discharge: 2016-10-14 | Disposition: A | Discharge status: Home / Self Care | Payer: Self-pay | Attending: Emergency Medicine | Admitting: Emergency Medicine

## 2016-10-14 DIAGNOSIS — R079 Chest pain, unspecified: Secondary | ICD-10-CM | POA: Insufficient documentation

## 2016-10-14 DIAGNOSIS — I252 Old myocardial infarction: Secondary | ICD-10-CM | POA: Insufficient documentation

## 2016-10-14 DIAGNOSIS — R0789 Other chest pain: Secondary | ICD-10-CM | POA: Insufficient documentation

## 2016-10-14 DIAGNOSIS — F17210 Nicotine dependence, cigarettes, uncomplicated: Secondary | ICD-10-CM | POA: Insufficient documentation

## 2016-10-14 DIAGNOSIS — I10 Essential (primary) hypertension: Secondary | ICD-10-CM | POA: Insufficient documentation

## 2016-10-14 DIAGNOSIS — Z5321 Procedure and treatment not carried out due to patient leaving prior to being seen by health care provider: Secondary | ICD-10-CM | POA: Insufficient documentation

## 2016-10-14 DIAGNOSIS — R0602 Shortness of breath: Secondary | ICD-10-CM | POA: Insufficient documentation

## 2016-10-14 DIAGNOSIS — Z8673 Personal history of transient ischemic attack (TIA), and cerebral infarction without residual deficits: Secondary | ICD-10-CM | POA: Insufficient documentation

## 2016-10-14 DIAGNOSIS — R51 Headache: Secondary | ICD-10-CM | POA: Insufficient documentation

## 2016-10-14 DIAGNOSIS — Z79899 Other long term (current) drug therapy: Secondary | ICD-10-CM | POA: Insufficient documentation

## 2016-10-14 LAB — I-STAT TROPONIN, ED
TROPONIN I, POC: 0 ng/mL (ref 0.00–0.08)
Troponin i, poc: 0 ng/mL (ref 0.00–0.08)

## 2016-10-14 LAB — ETHANOL: Alcohol, Ethyl (B): <5 mg/dL (ref <5)

## 2016-10-14 MED ORDER — ACETAMINOPHEN 325 MG PO TABS: 650.0000 mg | ORAL_TABLET | Freq: Once | ORAL | Status: DC

## 2016-10-14 NOTE — ED Notes (Signed)
Patient walked out of room, asked patient if I could help him. He stated that he was leaving. I informed him that he was not discharged. He stated that his food was here in lobby, and would not walk back to room.

## 2016-10-14 NOTE — ED Notes (Signed)
Pt ambulatory from waiting area to room in D pod. No SOB, weakness, or difficulty ambulating. Pt found sleeping soundly in waiting area and remains sleepy.

## 2016-10-14 NOTE — ED Notes (Signed)
Pt ambulated to and from restroom under his own power and without needing assistance.

## 2016-10-14 NOTE — ED Notes (Signed)
Pt walked out of room and informed the EMT that he was going to the lobby and would not be back. Pt observed to be stable on his feet and appeared to be in NAD.

## 2016-10-14 NOTE — ED Notes (Signed)
Pt given sandwich meal and Sprite.

## 2016-10-14 NOTE — ED Triage Notes (Signed)
Pt left ED around 9:30pm tonight and returns for continued sob, chest pain, back pain, and dental pain.

## 2016-10-14 NOTE — ED Triage Notes (Signed)
States headache for one day clear speech noted moves all extremities and pain occipital pain and behind right ear.

## 2016-10-14 NOTE — ED Notes (Signed)
ED Provider at bedside. 

## 2016-10-14 NOTE — ED Provider Notes (Signed)
[facility]Y DEPT Provider Note   CSN: [id number] Arrival date & time: 10/14/16  7829     History   Chief Complaint Chief Complaint  Patient presents with  . Illness    HPI Dustin Hansen is a 46 y.o. male.  Patient c/o pain mid chest x several months. Pain constant, dull, moderate, persistent. Denies acute or abrupt change in pain. Worse w certain movement or palpation chest wall. Denies fall/trauma. Hx etoh abuse, denies recent. Denies cough or uri c/o. No fever or chills.   The history is provided by the patient.  Illness  Associated symptoms include chest pain. Pertinent negatives include no abdominal pain, no headaches and no shortness of breath.    Past Medical History:  Diagnosis Date  . Anxiety   . Arthritis 11/08/2011   "feet and knees"  . Back pain, chronic   . Bipolar 1 disorder (HCC)   . Burning with urination 11/08/2011  . Chronic bronchitis (HCC) 11/08/2011   "get it q month"  . Daily headache 11/08/2011  . Depression   . GERD (gastroesophageal reflux disease)   . Hepatitis 11/08/2011   "not sure which one"  . History of stomach ulcers 11/08/2011  . Homelessness   . Hypertension   . Mental disorder 11/08/2011   "I get crazy; I take medicine"  . Myocardial infarction [facility])    "think so; when I was young; used to go to heart dr"  . Pneumonia   . Schizo-affective schizophrenia (HCC)   . Seizures (HCC) 11/08/2011   "jerking kind"  . Shortness of breath 11/08/2011   "lying down"  . Stroke (HCC) 11/08/2011   points to right chest  . Thoracic aortic aneurysm [facility]) 2012   4.4cm in October 2017    Patient Active Problem List   Diagnosis Date Noted  . Chronic pancreatitis (HCC) 11/14/2011  . Cannabis abuse 11/09/2011  . Hepatitis 11/08/2011  . Hyperglycemia 11/08/2011  . Alcohol abuse 11/08/2011  . Transaminasemia 11/08/2011  . Elevated lipase 11/08/2011  . Chronic back pain 02/07/2011  . Insomnia 02/07/2011    Past Surgical History:  Procedure  Laterality Date  . CHOLECYSTECTOMY  2008  . EUS  11/14/2011   Procedure: FULL UPPER ENDOSCOPIC ULTRASOUND (EUS) RADIAL;  Surgeon: Willis Modena, MD;  Location: [facility] ENDOS[facility];  Service: Endoscopy;  Laterality: N/A;  to be carelinked from [facility]       Home Medications    Prior to Admission medications   Medication Sig Start Date End Date Taking? Authorizing Provider  acetaminophen (TYLENOL) 325 MG tablet Take 2 tablets (650 mg total) by mouth every 6 (six) hours as needed. Patient not taking: Reported on 10/13/2016 09/24/16   Derwood Kaplan, MD  amoxicillin (AMOXIL) 500 MG capsule Take 1 capsule (500 mg total) by mouth 3 (three) times daily. Patient not taking: Reported on 10/13/2016 10/11/16   Janne Napoleon, NP  cyclobenzaprine (FLEXERIL) 10 MG tablet Take 1 tablet (10 mg total) by mouth 2 (two) times daily as needed for muscle spasms. Patient not taking: Reported on 10/13/2016 09/24/16   Derwood Kaplan, MD  methocarbamol (ROBAXIN) 500 MG tablet Take 1 tablet (500 mg total) by mouth 2 (two) times daily. Patient not taking: Reported on 09/20/2016 07/08/16   [name], PA-C  naproxen (NAPROSYN) 375 MG tablet Take 1 tablet (375 mg total) by mouth 2 (two) times daily. Patient not taking: Reported on 10/13/2016 10/11/16   Janne Napoleon, NP  oxyCODONE-acetaminophen (PERCOCET/ROXICET) 5-325 MG tablet Take 2 tablets by  mouth every 6 (six) hours as needed for severe pain. Patient not taking: Reported on 10/13/2016 01/03/16   Tegeler, Canary Brimhristopher J, MD  traMADol (ULTRAM) 50 MG tablet Take 1 tablet (50 mg total) by mouth every 12 (twelve) hours as needed for severe pain. Patient not taking: Reported on 06/21/2016 01/02/16   Danelle Berryapia, Leisa, PA-C    Family History Family History  Problem Relation Age of Onset  . Hypertension Mother     Social History Social History  Substance Use Topics  . Smoking status: Current Every Day Smoker    Packs/day: 0.50    Years: 29.00    Types: Cigarettes  . Smokeless tobacco:  Never Used  . Alcohol use Yes     Comment: 11/08/2011 "4-5 bottles beer/wk"     Allergies   Patient has no known allergies.   Review of Systems Review of Systems  Constitutional: Negative for chills and fever.  HENT: Negative for sore throat.   Eyes: Negative for redness.  Respiratory: Negative for cough and shortness of breath.   Cardiovascular: Positive for chest pain. Negative for leg swelling.  Gastrointestinal: Negative for abdominal pain.  Genitourinary: Negative for flank pain.  Musculoskeletal: Negative for back pain and neck pain.  Skin: Negative for rash.  Neurological: Negative for headaches.  Hematological: Does not bruise/bleed easily.  Psychiatric/Behavioral: Negative for confusion.     Physical Exam Updated Vital Signs BP 112/86 (BP Location: Right Arm)   Pulse 63   Temp 98 F (36.7 C) (Oral)   Resp 20   Ht 1.524 m (5')   Wt 68 kg (150 lb)   SpO2 98%   BMI 29.29 kg/m   Physical Exam  Constitutional: He appears well-developed and well-nourished. No distress.  HENT:  Head: Atraumatic.  Mouth/Throat: Oropharynx is clear and moist.  Eyes: Conjunctivae are normal.  Neck: Neck supple. No tracheal deviation present.  Cardiovascular: Normal rate, regular rhythm, normal heart sounds and intact distal pulses.  Exam reveals no gallop and no friction rub.   No murmur heard. Pulmonary/Chest: Effort normal and breath sounds normal. No accessory muscle usage. No respiratory distress. He exhibits tenderness.  Abdominal: Soft. Bowel sounds are normal. He exhibits no distension. There is no tenderness.  Musculoskeletal: He exhibits no edema or tenderness.  Neurological: He is alert.  Skin: Skin is warm and dry. No rash noted. He is not diaphoretic.  Psychiatric: He has a normal mood and affect.  Nursing note and vitals reviewed.    ED Treatments / Results  Labs (all labs ordered are listed, but only abnormal results are displayed) Results for orders placed or  performed during the hospital encounter of 10/14/16  I-stat troponin, ED  Result Value Ref Range   Troponin i, poc 0.00 0.00 - 0.08 ng/mL   Comment 3           Dg Chest 2 View  Result Date: 10/13/2016 CLINICAL DATA:  Full body aches and chest pain. Pain on inspiration. EXAM: CHEST  2 VIEW COMPARISON:  10/10/2016 FINDINGS: Normal heart size and pulmonary vascularity. No focal airspace disease or consolidation in the lungs. No blunting of costophrenic angles. No pneumothorax. Mediastinal contours appear intact. Old right rib fractures. IMPRESSION: No active cardiopulmonary disease. Electronically Signed   By: Burman NievesWilliam  Stevens M.D.   On: 10/13/2016 19:41   Dg Chest 2 View  Result Date: 10/10/2016 CLINICAL DATA:  Chest pain EXAM: CHEST  2 VIEW COMPARISON:  09/20/2016 FINDINGS: Cardiac shadow is within normal limits. Old rib  fractures with healing are noted on the right. No focal infiltrate or sizable effusion is seen. No acute bony abnormality is noted. IMPRESSION: No active cardiopulmonary disease. Electronically Signed   By: Alcide CleverMark  Lukens M.D.   On: 10/10/2016 17:58   Dg Chest 2 View  Result Date: 09/20/2016 CLINICAL DATA:  Sob. Pt found on street by EMS. Pt unable to verbalize any history. Hx per chart of chronic bronchitis, htn, MI EXAM: CHEST  2 VIEW COMPARISON:  01/03/2016 FINDINGS: Heart size is normal. The lungs are free of focal consolidations and pleural effusions. No pulmonary edema. Remote right rib fracture is stable. IMPRESSION: No evidence for acute cardiopulmonary abnormality. Electronically Signed   By: Norva PavlovElizabeth  Brown M.D.   On: 09/20/2016 17:11    ED ECG REPORT   Date: 10/14/2016  Rate: 60  Rhythm: normal sinus rhythm  QRS Axis: normal  Intervals: normal  ST/T Wave abnormalities: nonspecific ST changes  Conduction Disutrbances:none  Narrative Interpretation:   Old EKG Reviewed: unchanged  I have personally reviewed the EKG  Radiology Dg Chest 2 View  Result Date:  10/13/2016 CLINICAL DATA:  Full body aches and chest pain. Pain on inspiration. EXAM: CHEST  2 VIEW COMPARISON:  10/10/2016 FINDINGS: Normal heart size and pulmonary vascularity. No focal airspace disease or consolidation in the lungs. No blunting of costophrenic angles. No pneumothorax. Mediastinal contours appear intact. Old right rib fractures. IMPRESSION: No active cardiopulmonary disease. Electronically Signed   By: Burman NievesWilliam  Stevens M.D.   On: 10/13/2016 19:41    Procedures Procedures (including critical care time)  Medications Ordered in ED Medications - No data to display   Initial Impression / Assessment and Plan / ED Course  I have reviewed the triage vital signs and the nursing notes.  Pertinent labs & imaging results that were available during my care of the patient were reviewed by me and considered in my medical decision making (see chart for details).  Reviewed nursing notes and prior charts for additional history.   Patient with full set of labs and cxr yesterday - therefore will not repeat those.   Will check trop and ecg today.    Acetaminophen po. Po fluids.  Pts symptoms, exam, labs do not appear c/w ACS, and appear c/w musculoskeletal pain.  Patient appears stable for d/c.     Final Clinical Impressions(s) / ED Diagnoses   Final diagnoses:  None    New Prescriptions New Prescriptions   No medications on file     Cathren LaineSteinl, Curran Lenderman, MD 10/14/16 1055

## 2016-10-14 NOTE — ED Notes (Signed)
Pt departed in NAD, refused use of wheelchair.  

## 2016-10-14 NOTE — ED Notes (Signed)
Pt LWBS. He stated that he couldn't stay here all night. A&Ox4. Ambulatory.

## 2016-10-14 NOTE — Discharge Instructions (Signed)
It was our pleasure to provide your ER care today - we hope that you feel better. ° °Take acetaminophen and/or ibuprofen as need for pain. ° °Follow up with primary care doctor in the coming week. ° °Return to ER if worse, new symptoms, trouble breathing, other concern.  °

## 2016-10-14 NOTE — ED Triage Notes (Signed)
Pt arrives to ED ambulatory for not feeling well, pt states he was seen here this am and walked home where he lives on [address] and still feels same at this morning when seen. Pt walked from triage to patient care room in no distress, pt is warm dry. Pt appears disheveled in dirty clothes that have a strong foul order.

## 2016-10-14 NOTE — ED Notes (Signed)
Pt found sleeping in waiting room

## 2016-10-14 NOTE — ED Notes (Signed)
Pt going outside to smoke

## 2016-10-14 NOTE — ED Provider Notes (Signed)
[facility] Provider Note   CSN: [id number] Arrival date & time: 10/14/16  0024 By signing my name below, I, Levon HedgerElizabeth Hall, attest that this documentation has been prepared under the direction and in the presence of Keondria Siever, Cindee Saltourteney Lyn, MD . Electronically Signed: Levon HedgerElizabeth Hall, Scribe. 10/14/2016. 3:44 AM.   History   Chief Complaint Chief Complaint  Patient presents with  . Shortness of Breath  . Chest Pain    HPI Dustin Hansen is a 46 y.o. male with a history of anxiety, bipolar 1 disorder, depression, and schizophrenia who presents to the Emergency Department complaining of unchanging chest pain and shortness of breath onset last night. Pt has been seen twice in the last 24 hours for similar complaints. He states he went to sleep outside and returned because he was "worried about my heart". He ambulated back to exam room without difficulty. Pt denies any alcohol or illicit drug use. Pt has no other acute complaints or associated symptoms at this time.   The history is provided by the patient. No language interpreter was used.    Past Medical History:  Diagnosis Date  . Anxiety   . Arthritis 11/08/2011   "feet and knees"  . Back pain, chronic   . Bipolar 1 disorder (HCC)   . Burning with urination 11/08/2011  . Chronic bronchitis (HCC) 11/08/2011   "get it q month"  . Daily headache 11/08/2011  . Depression   . GERD (gastroesophageal reflux disease)   . Hepatitis 11/08/2011   "not sure which one"  . History of stomach ulcers 11/08/2011  . Homelessness   . Hypertension   . Mental disorder 11/08/2011   "I get crazy; I take medicine"  . Myocardial infarction [facility](HCC)    "think so; when I was young; used to go to heart dr"  . Pneumonia   . Schizo-affective schizophrenia (HCC)   . Seizures (HCC) 11/08/2011   "jerking kind"  . Shortness of breath 11/08/2011   "lying down"  . Stroke (HCC) 11/08/2011   points to right chest  . Thoracic aortic aneurysm [facility](HCC) 2012   4.4cm in  October 2017    Patient Active Problem List   Diagnosis Date Noted  . Chronic pancreatitis (HCC) 11/14/2011  . Cannabis abuse 11/09/2011  . Hepatitis 11/08/2011  . Hyperglycemia 11/08/2011  . Alcohol abuse 11/08/2011  . Transaminasemia 11/08/2011  . Elevated lipase 11/08/2011  . Chronic back pain 02/07/2011  . Insomnia 02/07/2011    Past Surgical History:  Procedure Laterality Date  . CHOLECYSTECTOMY  2008  . EUS  11/14/2011   Procedure: FULL UPPER ENDOSCOPIC ULTRASOUND (EUS) RADIAL;  Surgeon: Willis ModenaWilliam Outlaw, MD;  Location: [facility];  Service: Endoscopy;  Laterality: N/A;  [facility]       Home Medications    Prior to Admission medications   Medication Sig Start Date End Date Taking? Authorizing Provider  acetaminophen (TYLENOL) 325 MG tablet Take 2 tablets (650 mg total) by mouth every 6 (six) hours as needed. Patient not taking: Reported on 10/13/2016 09/24/16   Derwood KaplanNanavati, Ankit, MD  amoxicillin (AMOXIL) 500 MG capsule Take 1 capsule (500 mg total) by mouth 3 (three) times daily. Patient not taking: Reported on 10/13/2016 10/11/16   Janne NapoleonNeese, Hope M, NP  cyclobenzaprine (FLEXERIL) 10 MG tablet Take 1 tablet (10 mg total) by mouth 2 (two) times daily as needed for muscle spasms. Patient not taking: Reported on 10/13/2016 09/24/16   Derwood KaplanNanavati, Ankit, MD  methocarbamol (ROBAXIN) 500 MG tablet  Take 1 tablet (500 mg total) by mouth 2 (two) times daily. Patient not taking: Reported on 09/20/2016 07/08/16   [name], PA-C  naproxen (NAPROSYN) 375 MG tablet Take 1 tablet (375 mg total) by mouth 2 (two) times daily. Patient not taking: Reported on 10/13/2016 10/11/16   Janne Napoleon, NP  oxyCODONE-acetaminophen (PERCOCET/ROXICET) 5-325 MG tablet Take 2 tablets by mouth every 6 (six) hours as needed for severe pain. Patient not taking: Reported on 10/13/2016 01/03/16   Tegeler, Canary Brim, MD  traMADol (ULTRAM) 50 MG tablet Take 1 tablet (50 mg total) by mouth every 12 (twelve)  hours as needed for severe pain. Patient not taking: Reported on 06/21/2016 01/02/16   Danelle Berry, PA-C    Family History Family History  Problem Relation Age of Onset  . Hypertension Mother     Social History Social History  Substance Use Topics  . Smoking status: Current Every Day Smoker    Packs/day: 0.50    Years: 29.00    Types: Cigarettes  . Smokeless tobacco: Never Used  . Alcohol use Yes     Comment: 11/08/2011 "4-5 bottles beer/wk"     Allergies   Patient has no known allergies.   Review of Systems Review of Systems  Respiratory: Positive for shortness of breath.   Cardiovascular: Positive for chest pain.  All other systems reviewed and are negative.   Physical Exam Updated Vital Signs BP 110/73   Pulse 61   Temp 97.6 F (36.4 C) (Oral)   Resp 13   SpO2 98%   Physical Exam  Constitutional: He is oriented to person, place, and time. He appears well-developed and well-nourished.  HENT:  Head: Normocephalic and atraumatic.  Old bruising to right ear and left eye  Eyes: EOM are normal.  Neck: Normal range of motion.  Cardiovascular: Normal rate, regular rhythm, normal heart sounds and intact distal pulses.   Pulmonary/Chest: Effort normal and breath sounds normal. No respiratory distress.  He ambulated back to exam room without difficulty.  Abdominal: Soft. He exhibits no distension. There is no tenderness.  Musculoskeletal: Normal range of motion.  Neurological: He is alert and oriented to person, place, and time.  Skin: Skin is warm and dry.  Psychiatric: He has a normal mood and affect. Judgment normal.  Nursing note and vitals reviewed.  ED Treatments / Results  DIAGNOSTIC STUDIES:  Oxygen Saturation is 98% on RA, normal by my interpretation.    COORDINATION OF CARE:  3:47 AM Discussed treatment plan with pt at bedside and pt agreed to plan.   Labs (all labs ordered are listed, but only abnormal results are displayed) Labs Reviewed    I-STAT TROPONIN, ED    EKG  EKG Interpretation None       Radiology Dg Chest 2 View  Result Date: 10/13/2016 CLINICAL DATA:  Full body aches and chest pain. Pain on inspiration. EXAM: CHEST  2 VIEW COMPARISON:  10/10/2016 FINDINGS: Normal heart size and pulmonary vascularity. No focal airspace disease or consolidation in the lungs. No blunting of costophrenic angles. No pneumothorax. Mediastinal contours appear intact. Old right rib fractures. IMPRESSION: No active cardiopulmonary disease. Electronically Signed   By: Burman Nieves M.D.   On: 10/13/2016 19:41    Procedures Procedures (including critical care time)  Medications Ordered in ED Medications - No data to display   Initial Impression / Assessment and Plan / ED Course  I have reviewed the triage vital signs and the nursing notes.  Pertinent  labs & imaging results that were available during my care of the patient were reviewed by me and considered in my medical decision making (see chart for details).     I personally performed the services described in this documentation, which was scribed in my presence. The recorded information has been reviewed and is accurate.   Patient is a 46 year old male past medical history significant for bipolar and homelessness in addition to other medical comorbidities. He patient has been in the emergency department for the last 24 hours. He's been discharged twice. He went back to the waiting room, was seen going outside having cigarettes and then checked back in. Patient was sleeping soundly in the waiting room until he was called. Patient ambulated back to the room without issue or shortness of breath or chest pain. Patient is sleepy on exam as is the middle the night but otherwise appears well and has a reassuring vital signs and physical exam. Troponin drawn was negative. I don't that there is any utility in repeating lab work and imaging that was done within the last 24 hours. We'll  give him some food to see if it improves his energy level, and plan to discharge him.  Final Clinical Impressions(s) / ED Diagnoses   Final diagnoses:  None    New Prescriptions New Prescriptions   No medications on file     Abelino DerrickMackuen, Tyria Springer Lyn, MD 10/14/16 [phone]

## 2016-10-14 NOTE — ED Notes (Signed)
Bed: WTR6 Expected date:  Expected time:  Means of arrival:  Comments: EMS 46 yo male headache for 24 hours

## 2016-10-15 ENCOUNTER — Encounter (HOSPITAL_COMMUNITY): Payer: Self-pay | Admitting: Emergency Medicine

## 2016-10-15 ENCOUNTER — Emergency Department (HOSPITAL_COMMUNITY)
Admission: EM | Admit: 2016-10-15 | Discharge: 2016-10-15 | Disposition: A | Discharge status: Home / Self Care | Payer: Self-pay | Attending: Emergency Medicine | Admitting: Emergency Medicine

## 2016-10-15 DIAGNOSIS — F17210 Nicotine dependence, cigarettes, uncomplicated: Secondary | ICD-10-CM | POA: Insufficient documentation

## 2016-10-15 DIAGNOSIS — G8929 Other chronic pain: Secondary | ICD-10-CM

## 2016-10-15 DIAGNOSIS — Z79899 Other long term (current) drug therapy: Secondary | ICD-10-CM | POA: Insufficient documentation

## 2016-10-15 DIAGNOSIS — I10 Essential (primary) hypertension: Secondary | ICD-10-CM | POA: Insufficient documentation

## 2016-10-15 DIAGNOSIS — R0789 Other chest pain: Secondary | ICD-10-CM | POA: Insufficient documentation

## 2016-10-15 NOTE — ED Notes (Signed)
Pt ambulatory in waiting room video chatting on cell phone, two blankets given

## 2016-10-15 NOTE — ED Notes (Signed)
When asked why pt is here, pt states "I wanna eat."

## 2016-10-15 NOTE — ED Provider Notes (Signed)
[facility] DEPT Provider Note   CSN: [id number] Arrival date & time: 10/15/16  0045     History   Chief Complaint Chief Complaint  Patient presents with  . Muscle Pain    HPI Dustin Hansen is a 46 y.o. male.  Patient presents with complaint of chest pain. He reports it is the same pain he has had for months. No SOB, fever, cough. No alleviating factors. He reports it is worse with movement. No nausea, vomiting, headache or abdominal pain.   The history is provided by the patient.    Past Medical History:  Diagnosis Date  . Anxiety   . Arthritis 11/08/2011   "feet and knees"  . Back pain, chronic   . Bipolar 1 disorder (HCC)   . Burning with urination 11/08/2011  . Chronic bronchitis (HCC) 11/08/2011   "get it q month"  . Daily headache 11/08/2011  . Depression   . GERD (gastroesophageal reflux disease)   . Hepatitis 11/08/2011   "not sure which one"  . History of stomach ulcers 11/08/2011  . Homelessness   . Hypertension   . Mental disorder 11/08/2011   "I get crazy; I take medicine"  . Myocardial infarction [facility](HCC)    "think so; when I was young; used to go to heart dr"  . Pneumonia   . Schizo-affective schizophrenia (HCC)   . Seizures (HCC) 11/08/2011   "jerking kind"  . Shortness of breath 11/08/2011   "lying down"  . Stroke (HCC) 11/08/2011   points to right chest  . Thoracic aortic aneurysm [facility](HCC) 2012   4.4cm in October 2017    Patient Active Problem List   Diagnosis Date Noted  . Chronic pancreatitis (HCC) 11/14/2011  . Cannabis abuse 11/09/2011  . Hepatitis 11/08/2011  . Hyperglycemia 11/08/2011  . Alcohol abuse 11/08/2011  . Transaminasemia 11/08/2011  . Elevated lipase 11/08/2011  . Chronic back pain 02/07/2011  . Insomnia 02/07/2011    Past Surgical History:  Procedure Laterality Date  . CHOLECYSTECTOMY  2008  . EUS  11/14/2011   Procedure: FULL UPPER ENDOSCOPIC ULTRASOUND (EUS) RADIAL;  Surgeon: Willis ModenaWilliam Outlaw, MD;  Location: [facility] ENDOSCOPY;   Service: Endoscopy;  Laterality: N/A;  [facility]       Home Medications    Prior to Admission medications   Medication Sig Start Date End Date Taking? Authorizing Provider  acetaminophen (TYLENOL) 325 MG tablet Take 2 tablets (650 mg total) by mouth every 6 (six) hours as needed. Patient not taking: Reported on 10/13/2016 09/24/16   Derwood KaplanNanavati, Ankit, MD  amoxicillin (AMOXIL) 500 MG capsule Take 1 capsule (500 mg total) by mouth 3 (three) times daily. Patient not taking: Reported on 10/13/2016 10/11/16   Janne NapoleonNeese, Hope M, NP  cyclobenzaprine (FLEXERIL) 10 MG tablet Take 1 tablet (10 mg total) by mouth 2 (two) times daily as needed for muscle spasms. Patient not taking: Reported on 10/13/2016 09/24/16   Derwood KaplanNanavati, Ankit, MD  methocarbamol (ROBAXIN) 500 MG tablet Take 1 tablet (500 mg total) by mouth 2 (two) times daily. Patient not taking: Reported on 09/20/2016 07/08/16   Audry PiliMohr, Tyler, PA-C  naproxen (NAPROSYN) 375 MG tablet Take 1 tablet (375 mg total) by mouth 2 (two) times daily. Patient not taking: Reported on 10/13/2016 10/11/16   Janne NapoleonNeese, Hope M, NP  oxyCODONE-acetaminophen (PERCOCET/ROXICET) 5-325 MG tablet Take 2 tablets by mouth every 6 (six) hours as needed for severe pain. Patient not taking: Reported on 10/13/2016 01/03/16   Tegeler, Canary Brimhristopher J, MD  traMADol (ULTRAM) 50 MG tablet Take 1 tablet (50 mg total) by mouth every 12 (twelve) hours as needed for severe pain. Patient not taking: Reported on 06/21/2016 01/02/16   Danelle Berryapia, Leisa, PA-C    Family History Family History  Problem Relation Age of Onset  . Hypertension Mother     Social History Social History  Substance Use Topics  . Smoking status: Current Every Day Smoker    Packs/day: 0.50    Years: 29.00    Types: Cigarettes  . Smokeless tobacco: Never Used  . Alcohol use Yes     Comment: 11/08/2011 "4-5 bottles beer/wk"     Allergies   Patient has no known allergies.   Review of Systems Review of Systems    Constitutional: Negative for chills and fever.  Respiratory: Negative.  Negative for cough and shortness of breath.   Cardiovascular: Positive for chest pain.  Gastrointestinal: Negative.  Negative for abdominal pain.  Musculoskeletal: Negative.   Skin: Negative.   Neurological: Negative.      Physical Exam Updated Vital Signs BP 124/85 (BP Location: Right Arm)   Pulse 97   Temp (!) 97.3 F (36.3 C) (Oral)   Resp 16   SpO2 97%   Physical Exam  Constitutional: He is oriented to person, place, and time. He appears well-developed and well-nourished.  Patient is sleepy, easily awakened.   HENT:  Head: Normocephalic.  Neck: Normal range of motion. Neck supple.  Cardiovascular: Normal rate and regular rhythm.   Pulmonary/Chest: Effort normal and breath sounds normal. He has no wheezes. He has no rales. He exhibits tenderness.  Abdominal: Soft. Bowel sounds are normal. There is no tenderness. There is no rebound and no guarding.  Musculoskeletal: Normal range of motion.  Neurological: He is alert and oriented to person, place, and time.  Skin: Skin is warm and dry. No rash noted.  Psychiatric: He has a normal mood and affect.     ED Treatments / Results  Labs (all labs ordered are listed, but only abnormal results are displayed) Labs Reviewed - No data to display  EKG  EKG Interpretation None       Radiology Dg Chest 2 View  Result Date: 10/13/2016 CLINICAL DATA:  Full body aches and chest pain. Pain on inspiration. EXAM: CHEST  2 VIEW COMPARISON:  10/10/2016 FINDINGS: Normal heart size and pulmonary vascularity. No focal airspace disease or consolidation in the lungs. No blunting of costophrenic angles. No pneumothorax. Mediastinal contours appear intact. Old right rib fractures. IMPRESSION: No active cardiopulmonary disease. Electronically Signed   By: Burman NievesWilliam  Stevens M.D.   On: 10/13/2016 19:41    Procedures Procedures (including critical care time)  Medications  Ordered in ED Medications - No data to display   Initial Impression / Assessment and Plan / ED Course  I have reviewed the triage vital signs and the nursing notes.  Pertinent labs & imaging results that were available during my care of the patient were reviewed by me and considered in my medical decision making (see chart for details).     The patient comes back to ED for the third time in 24 hours for chest pain. He is well appearing. VSS.   The patient has had 4 troponin levels in less than 24 hours. No further testing is felt required for chronic, unchanged chest pain. He is able to be discharged.   Final Clinical Impressions(s) / ED Diagnoses   Final diagnoses:  None   1. Chronic chest pain  New Prescriptions New Prescriptions   No medications on file     Danne Harbor 10/15/16 0330    Zadie Rhine, MD 10/15/16 [phone]

## 2016-10-15 NOTE — ED Triage Notes (Signed)
Pt to ED via [facility].  [facility] and when he was discharged he walked across the street and called 911 to be brought here.  Pt requesting pain meds.

## 2016-10-16 ENCOUNTER — Encounter (HOSPITAL_COMMUNITY): Payer: Self-pay | Admitting: Emergency Medicine

## 2016-10-16 ENCOUNTER — Emergency Department (HOSPITAL_COMMUNITY): Payer: Self-pay

## 2016-10-16 ENCOUNTER — Other Ambulatory Visit: Payer: Self-pay

## 2016-10-16 DIAGNOSIS — I10 Essential (primary) hypertension: Secondary | ICD-10-CM | POA: Insufficient documentation

## 2016-10-16 DIAGNOSIS — Z590 Homelessness: Secondary | ICD-10-CM | POA: Insufficient documentation

## 2016-10-16 DIAGNOSIS — Z8673 Personal history of transient ischemic attack (TIA), and cerebral infarction without residual deficits: Secondary | ICD-10-CM | POA: Insufficient documentation

## 2016-10-16 DIAGNOSIS — G8929 Other chronic pain: Secondary | ICD-10-CM | POA: Insufficient documentation

## 2016-10-16 DIAGNOSIS — F17210 Nicotine dependence, cigarettes, uncomplicated: Secondary | ICD-10-CM | POA: Insufficient documentation

## 2016-10-16 DIAGNOSIS — R0789 Other chest pain: Secondary | ICD-10-CM | POA: Insufficient documentation

## 2016-10-16 DIAGNOSIS — R112 Nausea with vomiting, unspecified: Secondary | ICD-10-CM | POA: Insufficient documentation

## 2016-10-16 LAB — CBC
HEMATOCRIT: 39.1 % (ref 39.0–52.0)
HEMOGLOBIN: 13.6 g/dL (ref 13.0–17.0)
MCH: 31 pg (ref 26.0–34.0)
MCHC: 34.8 g/dL (ref 30.0–36.0)
MCV: 89.1 fL (ref 78.0–100.0)
Platelets: 178 10*3/uL (ref 150–400)
RBC: 4.39 MIL/uL (ref 4.22–5.81)
RDW: 12.6 % (ref 11.5–15.5)
WBC: 6.9 10*3/uL (ref 4.0–10.5)

## 2016-10-16 LAB — BASIC METABOLIC PANEL
ANION GAP: 10 (ref 5–15)
CALCIUM: 8.8 mg/dL — AB (ref 8.9–10.3)
CO2: 22 mmol/L (ref 22–32)
Chloride: 107 mmol/L (ref 101–111)
Creatinine, Ser: 0.61 mg/dL (ref 0.61–1.24)
GFR calc Af Amer: >60 mL/min (ref >60)
GFR calc non Af Amer: >60 mL/min (ref >60)
GLUCOSE: 99 mg/dL (ref 65–99)
POTASSIUM: 3.2 mmol/L — AB (ref 3.5–5.1)
Sodium: 139 mmol/L (ref 135–145)

## 2016-10-16 LAB — I-STAT TROPONIN, ED: Troponin i, poc: 0.01 ng/mL (ref 0.00–0.08)

## 2016-10-16 NOTE — ED Triage Notes (Addendum)
Per EMS, [place], complaining of chest wall pain w/ productive cough.  Pain is reproduced w/ movement, palpation and cough.  Pt ambulatory on scene, denies any respiratory distress, no SOB, Weakness, N/V/D.  Pt reports "only drinking two cans of beer today...  No money."

## 2016-10-17 ENCOUNTER — Emergency Department (HOSPITAL_COMMUNITY)
Admission: EM | Admit: 2016-10-17 | Discharge: 2016-10-17 | Disposition: A | Discharge status: Home / Self Care | Payer: Self-pay | Attending: Emergency Medicine | Admitting: Emergency Medicine

## 2016-10-17 DIAGNOSIS — G8929 Other chronic pain: Secondary | ICD-10-CM

## 2016-10-17 LAB — HEPATIC FUNCTION PANEL
ALBUMIN: 3.7 g/dL (ref 3.5–5.0)
ALK PHOS: 54 U/L (ref 38–126)
ALT: 17 U/L (ref 17–63)
AST: 22 U/L (ref 15–41)
Bilirubin, Direct: <0.1 mg/dL — ABNORMAL LOW (ref 0.1–0.5)
TOTAL PROTEIN: 6.5 g/dL (ref 6.5–8.1)
Total Bilirubin: 0.3 mg/dL (ref 0.3–1.2)

## 2016-10-17 LAB — LIPASE, BLOOD: Lipase: 31 U/L (ref 11–51)

## 2016-10-17 NOTE — ED Provider Notes (Signed)
[facility] Provider Note   CSN: [id number] Arrival date & time: 10/16/16  2208     History   Chief Complaint Chief Complaint  Patient presents with  . Chest Pain    HPI Dustin Hansen is a 46 y.o. male.  Patient with history of schizophrenia, homelessness, alcohol use, frequent ED visits -- presents with complaint of chest pain. This is patient's 8th emergency department visit in the past 7 days. Patient is complaining of chest pain on the right side. He states it started yesterday at 10 AM. He has had associated vomiting and upper abdominal pain. No difficulty breathing or shortness of breath. Patient does have occasional cough that is productive. No treatments prior to arrival. No reported heart problems. Patient does have a history of a thoracic aortic aneurysm. No treatments prior to arrival. The onset of this condition was acute. The course is constant. Aggravating factors: none. Alleviating factors: none.        Past Medical History:  Diagnosis Date  . Anxiety   . Arthritis 11/08/2011   "feet and knees"  . Back pain, chronic   . Bipolar 1 disorder (HCC)   . Burning with urination 11/08/2011  . Chronic bronchitis (HCC) 11/08/2011   "get it q month"  . Daily headache 11/08/2011  . Depression   . GERD (gastroesophageal reflux disease)   . Hepatitis 11/08/2011   "not sure which one"  . History of stomach ulcers 11/08/2011  . Homelessness   . Hypertension   . Mental disorder 11/08/2011   "I get crazy; I take medicine"  . Myocardial infarction [facility](HCC)    "think so; when I was young; used to go to heart dr"  . Pneumonia   . Schizo-affective schizophrenia (HCC)   . Seizures (HCC) 11/08/2011   "jerking kind"  . Shortness of breath 11/08/2011   "lying down"  . Stroke (HCC) 11/08/2011   points to right chest  . Thoracic aortic aneurysm [facility](HCC) 2012   4.4cm in October 2017    Patient Active Problem List   Diagnosis Date Noted  . Chronic pancreatitis (HCC) 11/14/2011  .  Cannabis abuse 11/09/2011  . Hepatitis 11/08/2011  . Hyperglycemia 11/08/2011  . Alcohol abuse 11/08/2011  . Transaminasemia 11/08/2011  . Elevated lipase 11/08/2011  . Chronic back pain 02/07/2011  . Insomnia 02/07/2011    Past Surgical History:  Procedure Laterality Date  . CHOLECYSTECTOMY  2008  . EUS  11/14/2011   Procedure: FULL UPPER ENDOSCOPIC ULTRASOUND (EUS) RADIAL;  Surgeon: Willis ModenaWilliam Outlaw, MD;  Location: [facility] ENDOSCOPY;  Service: Endoscopy;  Laterality: N/A;  [facility]       Home Medications    Prior to Admission medications   Medication Sig Start Date End Date Taking? Authorizing Provider  acetaminophen (TYLENOL) 325 MG tablet Take 2 tablets (650 mg total) by mouth every 6 (six) hours as needed. Patient not taking: Reported on 10/13/2016 09/24/16   Derwood KaplanNanavati, Ankit, MD  amoxicillin (AMOXIL) 500 MG capsule Take 1 capsule (500 mg total) by mouth 3 (three) times daily. Patient not taking: Reported on 10/13/2016 10/11/16   Janne NapoleonNeese, Hope M, NP  cyclobenzaprine (FLEXERIL) 10 MG tablet Take 1 tablet (10 mg total) by mouth 2 (two) times daily as needed for muscle spasms. Patient not taking: Reported on 10/13/2016 09/24/16   Derwood KaplanNanavati, Ankit, MD  methocarbamol (ROBAXIN) 500 MG tablet Take 1 tablet (500 mg total) by mouth 2 (two) times daily. Patient not taking: Reported on 09/20/2016 07/08/16  [name], PA-C  naproxen (NAPROSYN) 375 MG tablet Take 1 tablet (375 mg total) by mouth 2 (two) times daily. Patient not taking: Reported on 10/13/2016 10/11/16   Janne Napoleon, NP  oxyCODONE-acetaminophen (PERCOCET/ROXICET) 5-325 MG tablet Take 2 tablets by mouth every 6 (six) hours as needed for severe pain. Patient not taking: Reported on 10/13/2016 01/03/16   Tegeler, Canary Brim, MD  traMADol (ULTRAM) 50 MG tablet Take 1 tablet (50 mg total) by mouth every 12 (twelve) hours as needed for severe pain. Patient not taking: Reported on 06/21/2016 01/02/16   Danelle Berry, PA-C    Family  History Family History  Problem Relation Age of Onset  . Hypertension Mother     Social History Social History  Substance Use Topics  . Smoking status: Current Every Day Smoker    Packs/day: 0.50    Years: 29.00    Types: Cigarettes  . Smokeless tobacco: Never Used  . Alcohol use Yes     Comment: 11/08/2011 "4-5 bottles beer/wk"     Allergies   Patient has no known allergies.   Review of Systems Review of Systems  Constitutional: Negative for diaphoresis and fever.  Eyes: Negative for redness.  Respiratory: Negative for cough and shortness of breath.   Cardiovascular: Positive for chest pain. Negative for palpitations and leg swelling.  Gastrointestinal: Positive for abdominal pain, nausea and vomiting.  Genitourinary: Negative for dysuria.  Musculoskeletal: Negative for back pain and neck pain.  Skin: Negative for rash.  Neurological: Negative for syncope and light-headedness.  Psychiatric/Behavioral: The patient is not nervous/anxious.      Physical Exam Updated Vital Signs BP (!) 153/96 (BP Location: Right Arm)   Pulse 78   Temp 98.9 F (37.2 C) (Oral)   Resp 20   SpO2 97%   Physical Exam  Constitutional: He appears well-developed and well-nourished.  HENT:  Head: Normocephalic and atraumatic.  Mouth/Throat: Oropharynx is clear and moist and mucous membranes are normal. Mucous membranes are not dry.  Eyes: Conjunctivae are normal.  Neck: Trachea normal and normal range of motion. Neck supple. Normal carotid pulses and no JVD present. No muscular tenderness present. Carotid bruit is not present. No tracheal deviation present.  Cardiovascular: Normal rate, regular rhythm, S1 normal, S2 normal, normal heart sounds and intact distal pulses.  Exam reveals no distant heart sounds and no decreased pulses.   No murmur heard. Pulmonary/Chest: Effort normal and breath sounds normal. No respiratory distress. He has no wheezes. He exhibits tenderness (R chest wall).    Abdominal: Soft. Normal aorta and bowel sounds are normal. There is tenderness (bilateral upper quadrants). There is no rebound and no guarding.  Musculoskeletal: He exhibits no edema.  Neurological: He is alert.  Skin: Skin is warm and dry. He is not diaphoretic. No cyanosis. No pallor.  Psychiatric: He has a normal mood and affect.  Nursing note and vitals reviewed.    ED Treatments / Results  Labs (all labs ordered are listed, but only abnormal results are displayed) Labs Reviewed  BASIC METABOLIC PANEL - Abnormal; Notable for the following:       Result Value   Potassium 3.2 (*)    BUN <5 (*)    Calcium 8.8 (*)    All other components within normal limits  HEPATIC FUNCTION PANEL - Abnormal; Notable for the following:    Bilirubin, Direct <0.1 (*)    All other components within normal limits  CBC  LIPASE, BLOOD  I-STAT TROPONIN, ED  EKG  EKG Interpretation None       Radiology Dg Chest 2 View  Result Date: 10/16/2016 CLINICAL DATA:  Chest wall pain and productive cough. EXAM: CHEST  2 VIEW COMPARISON:  10/13/2016. FINDINGS: Normal sized heart. Clear lungs with normal vascularity. Stable old, healed right seventh rib fracture. No acute fracture or pneumothorax. Cholecystectomy clips. IMPRESSION: No acute abnormality. Electronically Signed   By: Beckie SaltsSteven  Reid M.D.   On: 10/16/2016 22:38    Procedures Procedures (including critical care time)  Medications Ordered in ED Medications - No data to display   Initial Impression / Assessment and Plan / ED Course  I have reviewed the triage vital signs and the nursing notes.  Pertinent labs & imaging results that were available during my care of the patient were reviewed by me and considered in my medical decision making (see chart for details).     Patient seen and examined. Work-up initiated. Medications ordered.   Vital signs reviewed and are as follows: BP (!) 153/96 (BP Location: Right Arm)   Pulse 78   Temp  98.9 F (37.2 C) (Oral)   Resp 20   SpO2 97%   7:07 AM hepatic function panel and lipase are normal. Feel patient is safe for discharge to home at this time.  Encouraged PCP f/u.    Final Clinical Impressions(s) / ED Diagnoses   Final diagnoses:  Other chronic pain   Patient with history of alcoholism, presents to the emergency department with chest and abdominal pains. Patient has had associated cough and vomiting. Lab work is at baseline today. He has had multiple visits over the past week. Do not suspect any life-threatening or serious illnesses at this time.   No dangerous or life-threatening conditions suspected or identified by history, physical exam, and by work-up. No indications for hospitalization identified.    New Prescriptions New Prescriptions   No medications on file     Desmond DikeGeiple, Keyante Durio, PA-C 10/17/16 [id number]    Palumbo, April, MD 10/17/16 [phone]

## 2016-10-17 NOTE — Discharge Instructions (Signed)
Please read and follow all provided instructions.  Your diagnoses today include:  1. Other chronic pain     Tests performed today include:  An EKG of your heart  A chest x-ray  Cardiac enzymes - a blood test for heart muscle damage  Blood counts and electrolytes  Liver and pancreas function - normal  Vital signs. See below for your results today.   Medications prescribed:   None  Take any prescribed medications only as directed.  Follow-up instructions: Please follow-up with your primary care provider as soon as you can for further evaluation of your symptoms.   Return instructions:  SEEK IMMEDIATE MEDICAL ATTENTION IF:  You have severe chest pain, especially if the pain is crushing or pressure-like and spreads to the arms, back, neck, or jaw, or if you have sweating, nausea (feeling sick to your stomach), or shortness of breath. THIS IS AN EMERGENCY. Don't wait to see if the pain will go away. Get medical help at once. Call 911 or 0 (operator). DO NOT drive yourself to the hospital.   Your chest pain gets worse and does not go away with rest.   You have an attack of chest pain lasting longer than usual, despite rest and treatment with the medications your caregiver has prescribed.   You wake from sleep with chest pain or shortness of breath.  You feel dizzy or faint.  You have chest pain not typical of your usual pain for which you originally saw your caregiver.   You have any other emergent concerns regarding your health.  Additional Information: Chest pain comes from many different causes. Your caregiver has diagnosed you as having chest pain that is not specific for one problem, but does not require admission.  You are at low risk for an acute heart condition or other serious illness.   Your vital signs today were: BP (!) 154/106    Pulse (!) 59    Temp 98.9 F (37.2 C) (Oral)    Resp 14    SpO2 96%  If your blood pressure (BP) was elevated above 135/85 this  visit, please have this repeated by your doctor within one month. --------------

## 2016-10-17 NOTE — ED Notes (Signed)
Woke pt to ask about pain. Reports pain on the right side of his chest, 9-10.

## 2016-10-17 NOTE — ED Notes (Signed)
Pt resting, when waken up he grabs his chest and moans about the chest pain.

## 2016-10-17 NOTE — ED Notes (Signed)
Pt sleeping in lobby 

## 2016-10-19 ENCOUNTER — Encounter (HOSPITAL_COMMUNITY): Payer: Self-pay | Admitting: Emergency Medicine

## 2016-10-19 ENCOUNTER — Emergency Department (HOSPITAL_COMMUNITY)
Admission: EM | Admit: 2016-10-19 | Discharge: 2016-10-20 | Disposition: A | Discharge status: Home / Self Care | Payer: Self-pay | Attending: Emergency Medicine | Admitting: Emergency Medicine

## 2016-10-19 ENCOUNTER — Emergency Department (HOSPITAL_COMMUNITY): Payer: Self-pay

## 2016-10-19 DIAGNOSIS — I10 Essential (primary) hypertension: Secondary | ICD-10-CM | POA: Insufficient documentation

## 2016-10-19 DIAGNOSIS — F17210 Nicotine dependence, cigarettes, uncomplicated: Secondary | ICD-10-CM | POA: Insufficient documentation

## 2016-10-19 DIAGNOSIS — M545 Low back pain, unspecified: Secondary | ICD-10-CM

## 2016-10-19 DIAGNOSIS — G8929 Other chronic pain: Secondary | ICD-10-CM

## 2016-10-19 DIAGNOSIS — I252 Old myocardial infarction: Secondary | ICD-10-CM | POA: Insufficient documentation

## 2016-10-19 DIAGNOSIS — Z8673 Personal history of transient ischemic attack (TIA), and cerebral infarction without residual deficits: Secondary | ICD-10-CM | POA: Insufficient documentation

## 2016-10-19 MED ORDER — KETOROLAC TROMETHAMINE 15 MG/ML IJ SOLN
15.0000 mg | Freq: Once | INTRAMUSCULAR | Status: AC
  Administered 2016-10-20: 15 mg via INTRAVENOUS
  Filled 2016-10-19: qty 1

## 2016-10-19 NOTE — ED Notes (Signed)
SEE THE TRIAGE AND PAS NOTES  THIS PT FREQUENTS THE ED.  ALERT NO DISTRESS
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
## 2016-10-19 NOTE — ED Triage Notes (Signed)
Pt c/o 9/10 lower back since this morning. Denies any injury.

## 2016-10-20 MED ORDER — NAPROXEN 500 MG PO TABS: 500.0000 mg | ORAL_TABLET | Freq: Two times a day (BID) | ORAL | 0 refills | Status: DC

## 2016-10-20 NOTE — Discharge Instructions (Signed)
Back Pain: ° ° °Your back pain should be treated with medicines such as ibuprofen or aleve and this back pain should get better over the next 2 weeks.  However if you develop severe or worsening pain, low back pain with fever, numbness, weakness or inability to walk or urinate, you should return to the ER immediately.  Please follow up with your doctor this week for a recheck if still having symptoms. °Low back pain is discomfort in the lower back that may be due to injuries to muscles and ligaments around the spine.  Occasionally, it may be caused by a a problem to a part of the spine called a disc.  The pain may last several days or a week;  However, most patients get completely well in 4 weeks. ° °Self - care:  The application of heat can help soothe the pain.  Maintaining your daily activities, including walking, is encourged, as it will help you get better faster than just staying in bed. ° °Medications are also useful to help with pain control.  A commonly prescribed medications includes acetaminophen.  This medication is generally safe, though you should not take more than 8 of the extra strength (500mg) pills a day. ° °Non steroidal anti inflammatory medications including Ibuprofen and naproxen;  These medications help both pain and swelling and are very useful in treating back pain.  They should be taken with food, as they can cause stomach upset, and more seriously, stomach bleeding.   ° °Muscle relaxants:  These medications can help with muscle tightness that is a cause of lower back pain.  Most of these medications can cause drowsiness, and it is not safe to drive or use dangerous machinery while taking them. ° °You will need to follow up with  Your primary healthcare provider in 1-2 weeks for reassessment. ° °Be aware that if you develop new symptoms, such as a fever, leg weakness, difficulty with or loss of control of your urine or bowels, abdominal pain, or more severe pain, you will need to seek  medical attention and  / or return to the Emergency department. ° °If you do not have a doctor see the list below. ° °RESOURCE GUIDE ° °Chronic Pain Problems: °Contact [facility]  297-2271 °Patients need to be referred by their primary care doctor. ° °Insufficient Money for Medicine: °Contact United Way:  call "211" or [facility] 271-5999. ° °No Primary Care Doctor: °- Call [facility]  832-8000 - can help you locate a primary care doctor that  accepts your insurance, provides certain services, etc. °- Physician Referral Service- 1-800-533-3463 ° °Agencies that provide inexpensive medical care: °- [facility]  832-8035 °- [facility]  832-7272 °- [facility]  [phone] °- [facility]  832-4777 °- [facility]  373-0678 °- [facility]  272-1050 ° °Medicaid-accepting [place] Providers: °- [facility]- [address] ° [phone], Mon-Fri 9am-7pm, Sat 9am-1pm °- [facility]- [address], [address] ° [phone] °- [facility]- [address], [address] ° [phone] °- [facility]- [address] [place] [address] ° [phone] °- [facility]- [address], [phone] ° Only accepts [place] Access Medicaid patients after they have their name  applied to their card ° °Self Pay (no insurance) in [place]: °- Sickle Cell Patients: Dr Eric Dean, [facility] °   [address], [phone] °- [facility]- [address] ° [phone] °      -     [facility] [place]- [address] °      -     [facility]- see information above (Speak to Pam H if you do not have insurance) °      -  [facility]- [address], [phone] °      -  [facility] [place]- [address],  [phone] °      -  [facility]- [address] [place] [address],  [phone] °      -  Dr Osei-Bonsu-  [address], [place], [phone] °      -  [facility]- [address], [phone] °      -  [facility]- [address] [place] [address], [phone], also [address], [phone] °      -    [facility]- [address], [phone], 1st & 3rd Saturday   every month, 10am-1pm ° °1) Find a Doctor and Pay Out of Pocket °Although you won't have to find out who is covered by your insurance plan, it is a good idea to ask around and get recommendations. You will then need to call the office and see if the doctor you have chosen will accept you as a new patient and what types of options they offer for patients who are self-pay. Some doctors offer discounts or will set up payment plans for their patients who do not have insurance, but you will need to ask so you aren't surprised when you get to your appointment. ° °2) Contact Your Local Health Department °Not all health departments have doctors that can see patients for sick visits, but many do, so it is worth a call to see if yours does. If you don't know where your local health department is, you can check in your phone book. The CDC also has a tool to help you locate your state's health department, and many state websites also have listings of all of their local health departments. ° °3) Find a Walk-in Clinic °If your illness is not likely to be very severe or complicated, you may want to try a walk in clinic. These are popping up all over the country in pharmacies, drugstores, and shopping centers. They're usually staffed by nurse practitioners or physician assistants that have been trained to treat common illnesses and complaints. They're usually fairly quick and inexpensive. However, if you have serious medical issues or chronic medical problems, these are probably not your best option ° °STD Testing °- [place] [facility] [place], [facility], [address], [place], phone [phone] or 1-877-539-9860.  Monday - Friday, call for an appointment. °- [place] [facility] [place], [facility], [address], [place], phone [phone] or 1-877-539-9860.  Monday - Friday, call for an appointment. ° °Abuse/Neglect: °- [place] Child Abuse Hotline (336) 641-3795 °- [place] Child Abuse Hotline 800-378-5315 (After Hours) ° °Emergency Shelter:  [facility] (336) 271-5985 ° °Maternity Homes: °- [facility] (336) 275-9566 °- [facility] (704) 372-4663 ° °MRSA Hotline #:   832-7006 ° °[place] Resources ° °[facility] [place]  [employer] [place] [facility] °[address]                 [address]         [address]  °[place]                                               [place]                              [place] °Phone:  349-3220                                  Phone:  342-7768                   Phone:  342-8140 ° °[facility], 342-8316 °- [place] [facility] - [facility]- 1-888-581-9988 °      -     [facility] in [place], [address],                                  336-349-4454, Insurance ° °[place] Child Abuse Hotline °(336) 342-1394 or (336) 342-3537 (After Hours) ° ° °Behavioral Health Services ° °Substance Abuse Resources: °- [facility]  336-882-2125 °- [facility] 336-784-9470 °- [facility] 336-285-9073 °- [facility] 336-845-3988 °- Residential & Outpatient Substance Abuse Program  800-659-3381 ° °Psychological Services: °- [facility]  832-9600 °- [facility]  378-7881 °- [facility], [address], [place], ACCESS LINE: 1-800-853-5163 or 336-641-4981, Http://www.[facility]/services/adult.htm ° °Dental Assistance ° °If unable to pay or  uninsured, contact:  [facility] or [facility] to become qualified for the adult dental clinic. ° °Patients with Medicaid: [facility] °[address], [phone] °[address], [phone] ° °If unable to pay, or uninsured, contact [facility] (271-5999) or [place] (641-3152 in [place], 842-7733 in [place]) to become qualified for the adult dental clinic ° °Other Low-Cost Community Dental Services: °- [facility]- [address], [place], [place], [address], 723-1848, Ext. [address], 2nd and 4th Thursday of the month at 6:30am.  10 clients each day by appointment, can sometimes see walk-in patients if someone does not show for an appointment. °- [facility]- [address], [place], [place], [address], 723-7904 °- [facility]- [address], [place], [place], [address], [phone] °- [facility]- 342-8273 °- [facility]- 703-3100 °- [facility]- 570-6415 ° ° ° ° ° ° °

## 2016-10-22 ENCOUNTER — Emergency Department (HOSPITAL_COMMUNITY): Payer: Self-pay

## 2016-10-22 ENCOUNTER — Other Ambulatory Visit: Payer: Self-pay

## 2016-10-22 ENCOUNTER — Emergency Department (HOSPITAL_COMMUNITY)
Admission: EM | Admit: 2016-10-22 | Discharge: 2016-10-22 | Disposition: A | Discharge status: Home / Self Care | Payer: Self-pay | Attending: Emergency Medicine | Admitting: Emergency Medicine

## 2016-10-22 ENCOUNTER — Encounter (HOSPITAL_COMMUNITY): Payer: Self-pay | Admitting: Emergency Medicine

## 2016-10-22 DIAGNOSIS — R52 Pain, unspecified: Secondary | ICD-10-CM

## 2016-10-22 DIAGNOSIS — R0602 Shortness of breath: Secondary | ICD-10-CM | POA: Insufficient documentation

## 2016-10-22 DIAGNOSIS — F17210 Nicotine dependence, cigarettes, uncomplicated: Secondary | ICD-10-CM | POA: Insufficient documentation

## 2016-10-22 DIAGNOSIS — I10 Essential (primary) hypertension: Secondary | ICD-10-CM | POA: Insufficient documentation

## 2016-10-22 DIAGNOSIS — R05 Cough: Secondary | ICD-10-CM | POA: Insufficient documentation

## 2016-10-22 DIAGNOSIS — R059 Cough, unspecified: Secondary | ICD-10-CM

## 2016-10-22 DIAGNOSIS — M791 Myalgia: Secondary | ICD-10-CM | POA: Insufficient documentation

## 2016-10-22 LAB — CBC WITH DIFFERENTIAL/PLATELET
BASOS ABS: 0 10*3/uL (ref 0.0–0.1)
BASOS PCT: 1 %
Eosinophils Absolute: 0.2 10*3/uL (ref 0.0–0.7)
Eosinophils Relative: 3 %
HEMATOCRIT: 44.1 % (ref 39.0–52.0)
HEMOGLOBIN: 15.1 g/dL (ref 13.0–17.0)
Lymphocytes Relative: 45 %
Lymphs Abs: 2.8 10*3/uL (ref 0.7–4.0)
MCH: 30.7 pg (ref 26.0–34.0)
MCHC: 34.2 g/dL (ref 30.0–36.0)
MCV: 89.6 fL (ref 78.0–100.0)
Monocytes Absolute: 0.4 10*3/uL (ref 0.1–1.0)
Monocytes Relative: 6 %
NEUTROS ABS: 2.8 10*3/uL (ref 1.7–7.7)
NEUTROS PCT: 45 %
Platelets: 218 10*3/uL (ref 150–400)
RBC: 4.92 MIL/uL (ref 4.22–5.81)
RDW: 13 % (ref 11.5–15.5)
WBC: 6.2 10*3/uL (ref 4.0–10.5)

## 2016-10-22 LAB — BASIC METABOLIC PANEL
ANION GAP: 10 (ref 5–15)
BUN: 9 mg/dL (ref 6–20)
CALCIUM: 8.7 mg/dL — AB (ref 8.9–10.3)
CHLORIDE: 104 mmol/L (ref 101–111)
CO2: 22 mmol/L (ref 22–32)
Creatinine, Ser: 0.83 mg/dL (ref 0.61–1.24)
GFR calc non Af Amer: >60 mL/min (ref >60)
Glucose, Bld: 88 mg/dL (ref 65–99)
POTASSIUM: 3.8 mmol/L (ref 3.5–5.1)
Sodium: 136 mmol/L (ref 135–145)

## 2016-10-22 LAB — I-STAT TROPONIN, ED: Troponin i, poc: 0 ng/mL (ref 0.00–0.08)

## 2016-10-22 MED ORDER — ALBUTEROL SULFATE (2.5 MG/3ML) 0.083% IN NEBU
5.0000 mg | INHALATION_SOLUTION | Freq: Once | RESPIRATORY_TRACT | Status: AC
  Administered 2016-10-22: 5 mg via RESPIRATORY_TRACT
  Filled 2016-10-22: qty 6

## 2016-10-22 NOTE — ED Triage Notes (Addendum)
[facility] to find pt reporting SOB while smoking a cigarrett.  Upon arrival pt was not exhibiting SOB, while in Triage pt's O2 never got below 94 on room air, mostly above 95%.

## 2016-10-22 NOTE — Discharge Instructions (Signed)
Your work-up is reassuring. Chest x-ray is normal. Your smoking is likely contributing your symptoms.

## 2016-10-22 NOTE — ED Notes (Signed)
Provided patient with [place] sandwich and coke, as well as a bus pass. Patient verbalized understanding of discharge instructions and denies any further needs or questions at this time. VS stable. Patient ambulatory with steady gait.

## 2016-10-22 NOTE — ED Provider Notes (Signed)
[facility]-EM[facility]ENCY DEPT Provider Note   CSN: [id number] Arrival date & time: 10/22/16  2004     History   Chief Complaint Chief Complaint  Patient presents with  . Shortness of Breath    HPI Dustin Hansen is a 46 y.o. male.  HPI 46 year old male who presents with body aches, shortness of breath, and cough starting just prior to arrival. According to EMS, they were called to a gas station to find patient reporting shortness of breath while smoking a cigarette. The patient was never hypoxic and appeared comfortable. Brought in for evaluation. Patient states that he feels unwell. Also reports subjective fevers and chills.  History of schizoaffective schizophrenia, homelessness, bipolar disorder, chronic pain.  Past Medical History:  Diagnosis Date  . Anxiety   . Arthritis 11/08/2011   "feet and knees"  . Back pain, chronic   . Bipolar 1 disorder (HCC)   . Burning with urination 11/08/2011  . Chronic bronchitis (HCC) 11/08/2011   "get it q month"  . Daily headache 11/08/2011  . Depression   . GERD (gastroesophageal reflux disease)   . Hepatitis 11/08/2011   "not sure which one"  . History of stomach ulcers 11/08/2011  . Homelessness   . Hypertension   . Mental disorder 11/08/2011   "I get crazy; I take medicine"  . Myocardial infarction [facility](HCC)    "think so; when I was young; used to go to heart dr"  . Pneumonia   . Schizo-affective schizophrenia (HCC)   . Seizures (HCC) 11/08/2011   "jerking kind"  . Shortness of breath 11/08/2011   "lying down"  . Stroke (HCC) 11/08/2011   points to right chest  . Thoracic aortic aneurysm [facility](HCC) 2012   4.4cm in October 2017    Patient Active Problem List   Diagnosis Date Noted  . Chronic pancreatitis (HCC) 11/14/2011  . Cannabis abuse 11/09/2011  . Hepatitis 11/08/2011  . Hyperglycemia 11/08/2011  . Alcohol abuse 11/08/2011  . Transaminasemia 11/08/2011  . Elevated lipase 11/08/2011  . Chronic back pain 02/07/2011  . Insomnia 02/07/2011     Past Surgical History:  Procedure Laterality Date  . CHOLECYSTECTOMY  2008  . EUS  11/14/2011   Procedure: FULL UPPER ENDOSCOPIC ULTRASOUND (EUS) RADIAL;  Surgeon: Willis ModenaWilliam Outlaw, MD;  Location: [facility] END[facility];  Service: Endoscopy;  Laterality: N/A;  [facility]       Home Medications    Prior to Admission medications   Medication Sig Start Date End Date Taking? Authorizing Provider  naproxen (NAPROSYN) 500 MG tablet Take 1 tablet (500 mg total) by mouth 2 (two) times daily. Patient taking differently: Take 500 mg by mouth 2 (two) times daily as needed for mild pain.  10/20/16  Yes Maczis, Elmer SowMichael M, PA-C    Family History Family History  Problem Relation Age of Onset  . Hypertension Mother     Social History Social History  Substance Use Topics  . Smoking status: Current Every Day Smoker    Packs/day: 0.50    Years: 29.00    Types: Cigarettes  . Smokeless tobacco: Never Used  . Alcohol use Yes     Comment: 11/08/2011 "4-5 bottles beer/wk"     Allergies   Patient has no known allergies.   Review of Systems Review of Systems  HENT: Positive for congestion.   Respiratory: Positive for cough and shortness of breath.   Musculoskeletal: Positive for myalgias.  All other systems reviewed and are negative.    Physical Exam Updated  Vital Signs BP (!) 131/92   Pulse 72   Temp 98.4 F (36.9 C) (Oral)   Resp 16   SpO2 92%   Physical Exam Physical Exam  Nursing note and vitals reviewed. Constitutional: Well developed, well nourished, non-toxic, and in no acute distress Head: Normocephalic and atraumatic.  Mouth/Throat: Oropharynx is clear and moist.  Neck: Normal range of motion. Neck supple.  Cardiovascular: Normal rate and regular rhythm.   Pulmonary/Chest: Effort normal and breath sounds normal.  Abdominal: Soft. There is no tenderness. There is no rebound and no guarding.  Musculoskeletal: Normal range of motion.  Neurological: Alert, no  facial droop, fluent speech, moves all extremities symmetrically Skin: Skin is warm and dry.  Psychiatric: Cooperative   ED Treatments / Results  Labs (all labs ordered are listed, but only abnormal results are displayed) Labs Reviewed  BASIC METABOLIC PANEL - Abnormal; Notable for the following:       Result Value   Calcium 8.7 (*)    All other components within normal limits  CBC WITH DIFFERENTIAL/PLATELET  I-STAT TROPONIN, ED    EKG  EKG Interpretation  Date/Time:  Sunday October 22 2016 20:14:11 EDT Ventricular Rate:  83 PR Interval:  150 QRS Duration: 80 QT Interval:  358 QTC Calculation: 420 R Axis:   62 Text Interpretation:  Normal sinus rhythm Septal infarct , age undetermined Abnormal ECG similar to previous EKG  Confirmed by Crista CurbLiu, Kacelyn Rowzee [phone]) on 10/22/2016 9:39:54 PM       Radiology Dg Chest 2 View  Result Date: 10/22/2016 CLINICAL DATA:  Acute onset of shortness of breath. Initial encounter. EXAM: CHEST  2 VIEW COMPARISON:  Chest radiograph performed 10/16/2016 FINDINGS: The lungs are well-aerated and clear. There is no evidence of focal opacification, pleural effusion or pneumothorax. The heart is normal in size; the mediastinal contour is within normal limits. No acute osseous abnormalities are seen. There is chronic deformity of the right seventh lateral rib. IMPRESSION: No acute cardiopulmonary process seen. Electronically Signed   By: Roanna RaiderJeffery  Chang M.D.   On: 10/22/2016 21:15    Procedures Procedures (including critical care time)  Medications Ordered in ED Medications  albuterol (PROVENTIL) (2.5 MG/3ML) 0.083% nebulizer solution 5 mg (5 mg Nebulization Given 10/22/16 2104)     Initial Impression / Assessment and Plan / ED Course  I have reviewed the triage vital signs and the nursing notes.  Pertinent labs & imaging results that were available during my care of the patient were reviewed by me and considered in my medical decision making (see chart for  details).     Patient is well-appearing, and in no acute distress. His vital signs are not concerning. Lungs are clear but with mild bronchospastic type cough given albuterol. Symptoms likely contributed by smoking. Blood work is reassuring, and chest x-rays visualized and shows no acute cardio pulmonary processes. I do not feel that patient has emergent medical illness at this time. Records are reviewed and he has had 12 ED visits in the month of July. Stable for discharge.  Final Clinical Impressions(s) / ED Diagnoses   Final diagnoses:  Body aches  Cough    New Prescriptions New Prescriptions   No medications on file     Lavera GuiseLiu, Maximus Hoffert Duo, MD 10/22/16 [id number]

## 2016-10-23 ENCOUNTER — Encounter (HOSPITAL_COMMUNITY): Payer: Self-pay | Admitting: Emergency Medicine

## 2016-10-23 ENCOUNTER — Emergency Department (HOSPITAL_COMMUNITY)
Admission: EM | Admit: 2016-10-23 | Discharge: 2016-10-24 | Disposition: A | Discharge status: Home / Self Care | Payer: Self-pay | Attending: Emergency Medicine | Admitting: Emergency Medicine

## 2016-10-23 DIAGNOSIS — K0889 Other specified disorders of teeth and supporting structures: Secondary | ICD-10-CM | POA: Insufficient documentation

## 2016-10-23 DIAGNOSIS — F17210 Nicotine dependence, cigarettes, uncomplicated: Secondary | ICD-10-CM | POA: Insufficient documentation

## 2016-10-23 DIAGNOSIS — I10 Essential (primary) hypertension: Secondary | ICD-10-CM | POA: Insufficient documentation

## 2016-10-23 DIAGNOSIS — I252 Old myocardial infarction: Secondary | ICD-10-CM | POA: Insufficient documentation

## 2016-10-24 ENCOUNTER — Encounter (HOSPITAL_COMMUNITY): Payer: Self-pay | Admitting: Emergency Medicine

## 2016-10-24 DIAGNOSIS — Z8673 Personal history of transient ischemic attack (TIA), and cerebral infarction without residual deficits: Secondary | ICD-10-CM | POA: Insufficient documentation

## 2016-10-24 DIAGNOSIS — F17210 Nicotine dependence, cigarettes, uncomplicated: Secondary | ICD-10-CM | POA: Insufficient documentation

## 2016-10-24 DIAGNOSIS — G8929 Other chronic pain: Secondary | ICD-10-CM | POA: Insufficient documentation

## 2016-10-24 DIAGNOSIS — I252 Old myocardial infarction: Secondary | ICD-10-CM | POA: Insufficient documentation

## 2016-10-24 DIAGNOSIS — I10 Essential (primary) hypertension: Secondary | ICD-10-CM | POA: Insufficient documentation

## 2016-10-24 DIAGNOSIS — M542 Cervicalgia: Secondary | ICD-10-CM | POA: Insufficient documentation

## 2016-10-24 DIAGNOSIS — M545 Low back pain: Secondary | ICD-10-CM | POA: Insufficient documentation

## 2016-10-24 MED ORDER — IBUPROFEN 400 MG PO TABS
600.0000 mg | ORAL_TABLET | Freq: Once | ORAL | Status: AC
  Administered 2016-10-24: 03:00:00 600 mg via ORAL
  Filled 2016-10-24: qty 1

## 2016-10-24 MED ORDER — PENICILLIN V POTASSIUM 500 MG PO TABS: 500.0000 mg | ORAL_TABLET | Freq: Four times a day (QID) | ORAL | 0 refills | Status: AC

## 2016-10-24 MED ORDER — IBUPROFEN 600 MG PO TABS: 600.0000 mg | ORAL_TABLET | Freq: Four times a day (QID) | ORAL | 0 refills | Status: DC | PRN

## 2016-10-24 NOTE — ED Triage Notes (Signed)
Pt reports R upper dental pain present sine this AM.  Pt requesting pain medicine in triage.

## 2016-10-24 NOTE — ED Triage Notes (Signed)
Pt reports neck pain and states he hurts all over. Pt is ambulatory and in no distress.

## 2016-10-24 NOTE — ED Provider Notes (Signed)
[facility] Provider Note   CSN: [id number] Arrival date & time: 10/23/16  2024     History   Chief Complaint Chief Complaint  Patient presents with  . Dental Pain    HPI Dustin Hansen is a 46 y.o. male.  HPI  This a 46 year old male well-known to the emergency room who is homeless and has schizoaffective disorder who presents with dental pain. Patient reports onset of dental pain at 8 PM. No difficulty swallowing. No recent dental procedures. Denies fevers. Rates his pain at 8 out of 10. He has not taken anything for pain. He does not have a Education officer, community.  Past Medical History:  Diagnosis Date  . Anxiety   . Arthritis 11/08/2011   "feet and knees"  . Back pain, chronic   . Bipolar 1 disorder (HCC)   . Burning with urination 11/08/2011  . Chronic bronchitis (HCC) 11/08/2011   "get it q month"  . Daily headache 11/08/2011  . Depression   . GERD (gastroesophageal reflux disease)   . Hepatitis 11/08/2011   "not sure which one"  . History of stomach ulcers 11/08/2011  . Homelessness   . Hypertension   . Mental disorder 11/08/2011   "I get crazy; I take medicine"  . Myocardial infarction [facility])    "think so; when I was young; used to go to heart dr"  . Pneumonia   . Schizo-affective schizophrenia (HCC)   . Seizures (HCC) 11/08/2011   "jerking kind"  . Shortness of breath 11/08/2011   "lying down"  . Stroke (HCC) 11/08/2011   points to right chest  . Thoracic aortic aneurysm [facility]) 2012   4.4cm in October 2017    Patient Active Problem List   Diagnosis Date Noted  . Chronic pancreatitis (HCC) 11/14/2011  . Cannabis abuse 11/09/2011  . Hepatitis 11/08/2011  . Hyperglycemia 11/08/2011  . Alcohol abuse 11/08/2011  . Transaminasemia 11/08/2011  . Elevated lipase 11/08/2011  . Chronic back pain 02/07/2011  . Insomnia 02/07/2011    Past Surgical History:  Procedure Laterality Date  . CHOLECYSTECTOMY  2008  . EUS  11/14/2011   Procedure: FULL UPPER ENDOSCOPIC ULTRASOUND  (EUS) RADIAL;  Surgeon: Willis Modena, MD;  [facility];  Service: Endoscopy;  Laterality: N/A;  to be carelinked from [facility]       Home Medications    Prior to Admission medications   Medication Sig Start Date End Date Taking? Authorizing Provider  ibuprofen (ADVIL,MOTRIN) 600 MG tablet Take 1 tablet (600 mg total) by mouth every 6 (six) hours as needed. 10/24/16   Horton, Mayer Masker, MD  naproxen (NAPROSYN) 500 MG tablet Take 1 tablet (500 mg total) by mouth 2 (two) times daily. Patient taking differently: Take 500 mg by mouth 2 (two) times daily as needed for mild pain.  10/20/16   Maczis, Elmer Sow, PA-C  penicillin v potassium (VEETID) 500 MG tablet Take 1 tablet (500 mg total) by mouth 4 (four) times daily. 10/24/16 10/31/16  Shon Baton, MD    Family History Family History  Problem Relation Age of Onset  . Hypertension Mother     Social History Social History  Substance Use Topics  . Smoking status: Current Every Day Smoker    Packs/day: 0.50    Years: 29.00    Types: Cigarettes  . Smokeless tobacco: Never Used  . Alcohol use Yes     Comment: 11/08/2011 "4-5 bottles beer/wk"     Allergies   Patient has no known allergies.  Review of Systems Review of Systems  Constitutional: Negative for fever.  HENT: Positive for dental problem. Negative for drooling and trouble swallowing.   All other systems reviewed and are negative.    Physical Exam Updated Vital Signs BP (!) 147/93 (BP Location: Left Arm)   Pulse 67   Temp 97.7 F (36.5 C) (Oral)   Resp 18   Ht 5' (1.524 m)   Wt 63.5 kg (140 lb)   SpO2 99%   BMI 27.34 kg/m   Physical Exam  Constitutional: He is oriented to person, place, and time.  Disheveled, malodorous  HENT:  Head: Normocephalic and atraumatic.  Generally poor dentition with multiple missing teeth, tenderness to palpation over the right upper incisor, no obvious abscess, dental caries noted trismus, no fullness noted under the  tongue, no facial swelling  Cardiovascular: Normal rate and regular rhythm.   Pulmonary/Chest: Effort normal. No respiratory distress.  Neurological: He is alert and oriented to person, place, and time.  Skin: Skin is warm and dry.  Psychiatric: He has a normal mood and affect.  Nursing note and vitals reviewed.    ED Treatments / Results  Labs (all labs ordered are listed, but only abnormal results are displayed) Labs Reviewed - No data to display  EKG  EKG Interpretation None       Radiology Dg Chest 2 View  Result Date: 10/22/2016 CLINICAL DATA:  Acute onset of shortness of breath. Initial encounter. EXAM: CHEST  2 VIEW COMPARISON:  Chest radiograph performed 10/16/2016 FINDINGS: The lungs are well-aerated and clear. There is no evidence of focal opacification, pleural effusion or pneumothorax. The heart is normal in size; the mediastinal contour is within normal limits. No acute osseous abnormalities are seen. There is chronic deformity of the right seventh lateral rib. IMPRESSION: No acute cardiopulmonary process seen. Electronically Signed   By: Roanna RaiderJeffery  Chang M.D.   On: 10/22/2016 21:15    Procedures Procedures (including critical care time)  Medications Ordered in ED Medications  ibuprofen (ADVIL,MOTRIN) tablet 600 mg (not administered)     Initial Impression / Assessment and Plan / ED Course  I have reviewed the triage vital signs and the nursing notes.  Pertinent labs & imaging results that were available during my care of the patient were reviewed by me and considered in my medical decision making (see chart for details).     Patient presents with dental pain. Uncomplicated on exam. Vital signs reassuring. Exam without evidence of acute abscess but generally poor dentition. Recommend penicillin and ibuprofen. Recent BMP reviewed with normal creatinine. Dental resources provided.  After history, exam, and medical workup I feel the patient has been appropriately  medically screened and is safe for discharge home. Pertinent diagnoses were discussed with the patient. Patient was given return precautions.   .  Final Clinical Impressions(s) / ED Diagnoses   Final diagnoses:  Pain, dental    New Prescriptions New Prescriptions   IBUPROFEN (ADVIL,MOTRIN) 600 MG TABLET    Take 1 tablet (600 mg total) by mouth every 6 (six) hours as needed.   PENICILLIN V POTASSIUM (VEETID) 500 MG TABLET    Take 1 tablet (500 mg total) by mouth 4 (four) times daily.     Shon BatonHorton, Courtney F, MD 10/24/16 [phone]

## 2016-10-25 ENCOUNTER — Emergency Department (HOSPITAL_COMMUNITY)
Admission: EM | Admit: 2016-10-25 | Discharge: 2016-10-25 | Disposition: A | Discharge status: Home / Self Care | Payer: Self-pay | Attending: Emergency Medicine | Admitting: Emergency Medicine

## 2016-10-25 DIAGNOSIS — G8929 Other chronic pain: Secondary | ICD-10-CM

## 2016-10-25 MED ORDER — IBUPROFEN 800 MG PO TABS
800.0000 mg | ORAL_TABLET | Freq: Once | ORAL | Status: AC
Start: 2016-10-25 — End: 2016-10-25
  Administered 2016-10-25: 800 mg via ORAL
  Filled 2016-10-25: qty 1

## 2016-10-25 NOTE — ED Notes (Signed)
PT states understanding of care given, follow up care, and medication prescribed. PT ambulated from ED to car with a steady gait. 

## 2016-10-25 NOTE — ED Provider Notes (Signed)
TIME SEEN: 12:17 AM  By signing my name below, I, Dustin BrownerJennifer Hansen, attest that this documentation has been prepared under the direction and in the presence of Dustin Hansen, Dustin MawKristen N, DO. Electronically Signed: Diona BrownerJennifer Hansen, ED Scribe. 10/25/16. 12:18 AM.  CHIEF COMPLAINT: Neck pain  HPI: Dustin Hansen is a 46 y.o. male who is homeless with a PMHx of HTN,  chronic back pain and schizoaffective disorder who presents to the Emergency Department complaining of neck and lower back pain for the last few months. He reports "I hurt all over". Pt is well known to the ED and was last seen on 10/23/16 for dental pain. Pt reports he didn't drink tonight. Pt denies incontinence, fever, numbness, tingling or focal weakness or any other sx at this time. This feels similar to his chronic pain. Patient took all of his close often a waiting room because they were wet from the rain.  ROS: See HPI Constitutional: no fever  Eyes: no drainage  ENT: no runny nose   Cardiovascular:  no chest pain  Resp: no SOB  GI: no vomiting GU: no dysuria Integumentary: no rash  Allergy: no hives  Musculoskeletal: no leg swelling  Neurological: no slurred speech ROS otherwise negative  PAST MEDICAL HISTORY/PAST SURGICAL HISTORY:  Past Medical History:  Diagnosis Date  . Anxiety   . Arthritis 11/08/2011   "feet and knees"  . Back pain, chronic   . Bipolar 1 disorder (HCC)   . Burning with urination 11/08/2011  . Chronic bronchitis (HCC) 11/08/2011   "get it q month"  . Daily headache 11/08/2011  . Depression   . GERD (gastroesophageal reflux disease)   . Hepatitis 11/08/2011   "not sure which one"  . History of stomach ulcers 11/08/2011  . Homelessness   . Hypertension   . Mental disorder 11/08/2011   "I get crazy; I take medicine"  . Myocardial infarction [facility](HCC)    "think so; when I was young; used to go to heart dr"  . Pneumonia   . Schizo-affective schizophrenia (HCC)   . Seizures (HCC) 11/08/2011   "jerking kind"  .  Shortness of breath 11/08/2011   "lying down"  . Stroke (HCC) 11/08/2011   points to right chest  . Thoracic aortic aneurysm  [facility](HCC) 2012   4.4cm in October 2017    MEDICATIONS:  Prior to Admission medications   Medication Sig Start Date End Date Taking? Authorizing Provider  ibuprofen (ADVIL,MOTRIN) 600 MG tablet Take 1 tablet (600 mg total) by mouth every 6 (six) hours as needed. 10/24/16   Horton, Mayer Maskerourtney F, MD  naproxen (NAPROSYN) 500 MG tablet Take 1 tablet (500 mg total) by mouth 2 (two) times daily. Patient taking differently: Take 500 mg by mouth 2 (two) times daily as needed for mild pain.  10/20/16   Maczis, Elmer SowMichael M, PA-C  penicillin v potassium (VEETID) 500 MG tablet Take 1 tablet (500 mg total) by mouth 4 (four) times daily. 10/24/16 10/31/16  Horton, Mayer Maskerourtney F, MD    ALLERGIES:  No Known Allergies  SOCIAL HISTORY:  Social History  Substance Use Topics  . Smoking status: Current Every Day Smoker    Packs/day: 0.50    Years: 29.00    Types: Cigarettes  . Smokeless tobacco: Never Used  . Alcohol use Yes     Comment: 11/08/2011 "4-5 bottles beer/wk"    FAMILY HISTORY: Family History  Problem Relation Age of Onset  . Hypertension Mother     EXAM: BP (!) 154/90 (BP Location:  Right Arm)   Pulse 66   Temp 98 F (36.7 C) (Oral)   Ht 5' (1.524 m)   Wt 140 lb (63.5 kg)   SpO2 98%   BMI 27.34 kg/m  CONSTITUTIONAL: Alert and oriented and responds appropriately to questions. Chronically ill-appearing, in no distress, afebrile HEAD: Normocephalic EYES: Conjunctivae clear, pupils appear equal, EOMI ENT: normal nose; moist mucous membranes NECK: Supple, no meningismus, no nuchal rigidity, no LAD; no midline spinal tenderness or step-off or deformity CARD: RRR; S1 and S2 appreciated; no murmurs, no clicks, no rubs, no gallops RESP: Normal chest excursion without splinting or tachypnea; breath sounds clear and equal bilaterally; no wheezes, no rhonchi, no rales, no hypoxia or  respiratory distress, speaking full sentences ABD/GI: Normal bowel sounds; non-distended; soft, non-tender, no rebound, no guarding, no peritoneal signs, no hepatosplenomegaly BACK:  The back appears normal and is non-tender to palpation, there is no CVA tenderness, no midline spinal tenderness or step-off or deformity EXT: Normal ROM in all joints; non-tender to palpation; no edema; normal capillary refill; no cyanosis, no calf tenderness or swelling    SKIN: Normal color for age and race; warm; no rash NEURO: Moves all extremities equally, strength 5/5 in all 4 areas, normal sensation diffusely, no saddle anesthesia, normal gait, 2+ DP reflexes in bilateral upper lower extremity, no clonus PSYCH: The patient's mood and manner are appropriate. Grooming and personal hygiene are appropriate.  MEDICAL DECISION MAKING: Patient here with complaints of chronic neck and back pain. Has been seen here several times for similar symptoms. Neurologically intact without complaints of incontinence, fever or neurologic deficit. Patient is homeless. He is well-appearing and neurologically intact here. Will give ibuprofen for pain. I have discussed with him that is not appropriate to continue to return to the emergency department for complaints of chronic pain and he needs to follow-up with outpatient provider. Recommended alternating Tylenol and Motrin as needed. He has been here 25 times in the past 6 months.   At this time, I do not feel there is any life-threatening condition present. I have reviewed and discussed all results (EKG, imaging, lab, urine as appropriate) and exam findings with patient/family. I have reviewed nursing notes and appropriate previous records.  I feel the patient is safe to be discharged home without further emergent workup and can continue workup as an outpatient as needed. Discussed usual and customary return precautions. Patient/family verbalize understanding and are comfortable with this  plan.  Outpatient follow-up has been provided if needed. All questions have been answered.   I personally performed the services described in this documentation, which was scribed in my presence. The recorded information has been reviewed and is accurate.     Giankarlo Leamer, Dustin Maw, DO 10/25/16 [phone]

## 2016-10-25 NOTE — ED Notes (Signed)
PT states understanding of care given,, and medication prescribed. PT ambulated from ED

## 2016-10-25 NOTE — Discharge Instructions (Signed)
You may alternate Tylenol 1000 mg every 6 hours as needed for pain and Ibuprofen 800 mg every 8 hours as needed for pain.  Please take Ibuprofen with food. ° ° ° °To find a primary care or specialty doctor please call [phone] or [phone] to access "[place] Find a Doctor Service." ° °You may also go on the [facility] website at [email or web address] ° °There are also multiple [facility], [facility] and [facility] practices throughout the [place] that are frequently accepting new patients. You may find a clinic that is close to your home and contact them. ° °[facility] -  °[address] °[place] [address] °[phone] ° ° °[facility] -  °[address] °[place] [address] °[phone] ° ° °[facility] - °[address]  °[place] [address] °[phone] ° ° °

## 2016-10-30 ENCOUNTER — Emergency Department (HOSPITAL_COMMUNITY)
Admission: EM | Admit: 2016-10-30 | Discharge: 2016-10-30 | Disposition: A | Discharge status: Home / Self Care | Payer: Self-pay | Attending: Emergency Medicine | Admitting: Emergency Medicine

## 2016-10-30 ENCOUNTER — Encounter (HOSPITAL_COMMUNITY): Payer: Self-pay | Admitting: Emergency Medicine

## 2016-10-30 DIAGNOSIS — F99 Mental disorder, not otherwise specified: Secondary | ICD-10-CM | POA: Insufficient documentation

## 2016-10-30 DIAGNOSIS — F17210 Nicotine dependence, cigarettes, uncomplicated: Secondary | ICD-10-CM | POA: Insufficient documentation

## 2016-10-30 DIAGNOSIS — I10 Essential (primary) hypertension: Secondary | ICD-10-CM | POA: Insufficient documentation

## 2016-10-30 DIAGNOSIS — M79642 Pain in left hand: Secondary | ICD-10-CM | POA: Insufficient documentation

## 2016-10-30 MED ORDER — ACETAMINOPHEN 500 MG PO TABS: 500.0000 mg | ORAL_TABLET | Freq: Four times a day (QID) | ORAL | 0 refills | Status: DC | PRN

## 2016-10-30 MED ORDER — ACETAMINOPHEN 500 MG PO TABS
1000.0000 mg | ORAL_TABLET | Freq: Once | ORAL | Status: AC
  Administered 2016-10-30: 1000 mg via ORAL
  Filled 2016-10-30: qty 2

## 2016-10-30 NOTE — ED Triage Notes (Signed)
Pt BIB EMS. Pt c/o R hand pain. Denies trauma or specific injury. Began hurting tonight.

## 2016-10-30 NOTE — ED Provider Notes (Signed)
[facility]-EMERGENCY DEPT Provider Note   CSN: [id number] Arrival date & time: 10/30/16  0410    History   Chief Complaint Chief Complaint  Patient presents with  . Hand Pain    HPI Dustin Hansen is a 46 y.o. male.  46 year old male, well-known to the emergency department, presents today for left hand pain. He states that it began this morning. He has not taken any medication for his symptoms. It is worse when he moves his hand. He denies any trauma or injury inciting his symptoms. He has been seen multiple times in the emergency department, with 26 visits in the last 6 days. He is homeless. He expresses no additional complaints.      Past Medical History:  Diagnosis Date  . Anxiety   . Arthritis 11/08/2011   "feet and knees"  . Back pain, chronic   . Bipolar 1 disorder (HCC)   . Burning with urination 11/08/2011  . Chronic bronchitis (HCC) 11/08/2011   "get it q month"  . Daily headache 11/08/2011  . Depression   . GERD (gastroesophageal reflux disease)   . Hepatitis 11/08/2011   "not sure which one"  . History of stomach ulcers 11/08/2011  . Homelessness   . Hypertension   . Mental disorder 11/08/2011   "I get crazy; I take medicine"  . Myocardial infarction [facility](HCC)    "think so; when I was young; used to go to heart dr"  . Pneumonia   . Schizo-affective schizophrenia (HCC)   . Seizures (HCC) 11/08/2011   "jerking kind"  . Shortness of breath 11/08/2011   "lying down"  . Stroke (HCC) 11/08/2011   points to right chest  . Thoracic aortic aneurysm [facility](HCC) 2012   4.4cm in October 2017    Patient Active Problem List   Diagnosis Date Noted  . Chronic pancreatitis (HCC) 11/14/2011  . Cannabis abuse 11/09/2011  . Hepatitis 11/08/2011  . Hyperglycemia 11/08/2011  . Alcohol abuse 11/08/2011  . Transaminasemia 11/08/2011  . Elevated lipase 11/08/2011  . Chronic back pain 02/07/2011  . Insomnia 02/07/2011    Past Surgical History:  Procedure Laterality Date  . CHOLECYSTECTOMY   2008  . EUS  11/14/2011   Procedure: FULL UPPER ENDOSCOPIC ULTRASOUND (EUS) RADIAL;  Surgeon: Willis ModenaWilliam Outlaw, MD;  Location: [facility] ENDOSCOPY;  Service: Endoscopy;  Laterality: N/A;  [facility]       Home Medications    Prior to Admission medications   Medication Sig Start Date End Date Taking? Authorizing Provider  acetaminophen (TYLENOL) 500 MG tablet Take 1 tablet (500 mg total) by mouth every 6 (six) hours as needed. 10/30/16   Antony MaduraHumes, Allean Montfort, PA-C  ibuprofen (ADVIL,MOTRIN) 600 MG tablet Take 1 tablet (600 mg total) by mouth every 6 (six) hours as needed. 10/24/16   Horton, Mayer Maskerourtney F, MD  naproxen (NAPROSYN) 500 MG tablet Take 1 tablet (500 mg total) by mouth 2 (two) times daily. Patient taking differently: Take 500 mg by mouth 2 (two) times daily as needed for mild pain.  10/20/16   Maczis, Elmer SowMichael M, PA-C  penicillin v potassium (VEETID) 500 MG tablet Take 1 tablet (500 mg total) by mouth 4 (four) times daily. 10/24/16 10/31/16  Shon BatonHorton, Courtney F, MD    Family History Family History  Problem Relation Age of Onset  . Hypertension Mother     Social History Social History  Substance Use Topics  . Smoking status: Current Every Day Smoker    Packs/day: 0.50    Years:  29.00    Types: Cigarettes  . Smokeless tobacco: Never Used  . Alcohol use Yes     Comment: 11/08/2011 "4-5 bottles beer/wk"     Allergies   Patient has no known allergies.   Review of Systems Review of Systems Ten systems reviewed and are negative for acute change, except as noted in the HPI.    Physical Exam Updated Vital Signs BP (!) 167/102 (BP Location: Right Arm)   Pulse 69   Temp 97.7 F (36.5 C) (Oral)   Resp 18   SpO2 100%   Physical Exam  Constitutional: He is oriented to person, place, and time. He appears well-developed and well-nourished. No distress.  Nontoxic and in no acute distress  HENT:  Head: Normocephalic and atraumatic.  Eyes: Conjunctivae and EOM are normal. No scleral  icterus.  Neck: Normal range of motion.  Cardiovascular: Normal rate, regular rhythm and intact distal pulses.   Distal radial pulse 2+ in the left upper extremity  Pulmonary/Chest: Effort normal. No respiratory distress.  Musculoskeletal: Normal range of motion.  Normal range of motion of the left hand and digits. No bony deformity or crepitus. No swelling, erythema, heat to touch.  Neurological: He is alert and oriented to person, place, and time. He exhibits normal muscle tone. Coordination normal.  Patient to light touch intact  Skin: Skin is warm and dry. No rash noted. He is not diaphoretic. No erythema. No pallor.  Psychiatric: He has a normal mood and affect. His behavior is normal.  Nursing note and vitals reviewed.    ED Treatments / Results  Labs (all labs ordered are listed, but only abnormal results are displayed) Labs Reviewed - No data to display  EKG  EKG Interpretation None       Radiology No results found.  Procedures Procedures (including critical care time)  Medications Ordered in ED Medications  acetaminophen (TYLENOL) tablet 1,000 mg (1,000 mg Oral Given 10/30/16 0443)     Initial Impression / Assessment and Plan / ED Course  I have reviewed the triage vital signs and the nursing notes.  Pertinent labs & imaging results that were available during my care of the patient were reviewed by me and considered in my medical decision making (see chart for details).     46 year old male presents for atraumatic left hand pain. He is well-known to the emergency department and homeless. HTN at baseline. He is in no acute distress. Patient neurovascularly intact. No evidence of secondary infection or cellulitis. No bony deformity or crepitus. Will manage with Tylenol. Ice advised. Patient discharged in stable condition with no unaddressed concerns.   Final Clinical Impressions(s) / ED Diagnoses   Final diagnoses:  Left hand pain    New Prescriptions New  Prescriptions   ACETAMINOPHEN (TYLENOL) 500 MG TABLET    Take 1 tablet (500 mg total) by mouth every 6 (six) hours as needed.     Antony Madura, PA-C 10/30/16 2130    Glynn Octave, MD 10/30/16 [phone]

## 2016-10-30 NOTE — ED Notes (Signed)
Pt departed in NAD, refused use of wheelchair.  

## 2016-11-01 ENCOUNTER — Emergency Department (HOSPITAL_COMMUNITY)
Admission: EM | Admit: 2016-11-01 | Discharge: 2016-11-01 | Disposition: A | Discharge status: Home / Self Care | Payer: Self-pay | Attending: Emergency Medicine | Admitting: Emergency Medicine

## 2016-11-01 ENCOUNTER — Encounter (HOSPITAL_COMMUNITY): Payer: Self-pay

## 2016-11-01 DIAGNOSIS — M7918 Myalgia, other site: Secondary | ICD-10-CM

## 2016-11-01 DIAGNOSIS — G8929 Other chronic pain: Secondary | ICD-10-CM

## 2016-11-01 DIAGNOSIS — M79642 Pain in left hand: Secondary | ICD-10-CM | POA: Insufficient documentation

## 2016-11-01 DIAGNOSIS — F17210 Nicotine dependence, cigarettes, uncomplicated: Secondary | ICD-10-CM | POA: Insufficient documentation

## 2016-11-01 DIAGNOSIS — K029 Dental caries, unspecified: Secondary | ICD-10-CM

## 2016-11-01 DIAGNOSIS — I10 Essential (primary) hypertension: Secondary | ICD-10-CM | POA: Insufficient documentation

## 2016-11-01 DIAGNOSIS — M791 Myalgia: Secondary | ICD-10-CM | POA: Insufficient documentation

## 2016-11-01 MED ORDER — ACETAMINOPHEN 500 MG PO TABS
500.0000 mg | ORAL_TABLET | Freq: Once | ORAL | Status: AC
  Administered 2016-11-01: 500 mg via ORAL
  Filled 2016-11-01: qty 1

## 2016-11-01 NOTE — ED Provider Notes (Signed)
[facility]-EMERG[facility]Y DEPT Provider Note   CSN: [id number] Arrival date & time: 11/01/16  2110     History   Chief Complaint Chief Complaint  Patient presents with  . Hand Pain    HPI Dustin Hansen is a 46 y.o. male.  HPI  46 y.o. male very well known to ED presents for hand pain. Hx same. Noted care plan. States left hand pain. Began in AM. No meds PTA. Rates pain 10/10. Throbbing. Pain with ROM. No trauma or injury to area. No falls. This is 27th ED visit for same. Pt homeless. No numbness/tingling. No fevers. No swelling. Pt also notes right sided jaw pain. No trouble with PO intake. Communicates well. No dental caries. No unilateral facial swelling. No other symptoms noted.    Past Medical History:  Diagnosis Date  . Anxiety   . Arthritis 11/08/2011   "feet and knees"  . Back pain, chronic   . Bipolar 1 disorder (HCC)   . Burning with urination 11/08/2011  . Chronic bronchitis (HCC) 11/08/2011   "get it q month"  . Daily headache 11/08/2011  . Depression   . GERD (gastroesophageal reflux disease)   . Hepatitis 11/08/2011   "not sure which one"  . History of stomach ulcers 11/08/2011  . Homelessness   . Hypertension   . Mental disorder 11/08/2011   "I get crazy; I take medicine"  . Myocardial infarction [facility])    "think so; when I was young; used to go to heart dr"  . Pneumonia   . Schizo-affective schizophrenia (HCC)   . Seizures (HCC) 11/08/2011   "jerking kind"  . Shortness of breath 11/08/2011   "lying down"  . Stroke (HCC) 11/08/2011   points to right chest  . Thoracic aortic aneurysm [facility]) 2012   4.4cm in October 2017    Patient Active Problem List   Diagnosis Date Noted  . Chronic pancreatitis (HCC) 11/14/2011  . Cannabis abuse 11/09/2011  . Hepatitis 11/08/2011  . Hyperglycemia 11/08/2011  . Alcohol abuse 11/08/2011  . Transaminasemia 11/08/2011  . Elevated lipase 11/08/2011  . Chronic back pain 02/07/2011  . Insomnia 02/07/2011    Past Surgical History:    Procedure Laterality Date  . CHOLECYSTECTOMY  2008  . EUS  11/14/2011   Procedure: FULL UPPER ENDOSCOPIC ULTRASOUND (EUS) RADIAL;  Surgeon: Willis Modena, MD;  Location: [facility];  Service: Endoscopy;  Laterality: N/A;  [facility] from [facility]       Home Medications    Prior to Admission medications   Medication Sig Start Date End Date Taking? Authorizing Provider  acetaminophen (TYLENOL) 500 MG tablet Take 1 tablet (500 mg total) by mouth every 6 (six) hours as needed. 10/30/16   Antony Madura, PA-C  ibuprofen (ADVIL,MOTRIN) 600 MG tablet Take 1 tablet (600 mg total) by mouth every 6 (six) hours as needed. 10/24/16   Horton, Mayer Masker, MD  naproxen (NAPROSYN) 500 MG tablet Take 1 tablet (500 mg total) by mouth 2 (two) times daily. Patient taking differently: Take 500 mg by mouth 2 (two) times daily as needed for mild pain.  10/20/16   Maczis, Elmer Sow, PA-C    Family History Family History  Problem Relation Age of Onset  . Hypertension Mother     Social History Social History  Substance Use Topics  . Smoking status: Current Every Day Smoker    Packs/day: 0.50    Years: 29.00    Types: Cigarettes  . Smokeless tobacco: Never Used  . Alcohol  use Yes     Comment: 11/08/2011 "4-5 bottles beer/wk"     Allergies   Patient has no known allergies.   Review of Systems Review of Systems ROS reviewed and all are negative for acute change except as noted in the HPI.  Physical Exam Updated Vital Signs BP 123/86 (BP Location: Right Arm)   Pulse 79   Temp 98.1 F (36.7 C) (Oral)   Resp 20   SpO2 95%   Physical Exam  Constitutional: He is oriented to person, place, and time. He appears well-developed and well-nourished. No distress.  HENT:  Head: Normocephalic and atraumatic.  Right Ear: Tympanic membrane, external ear and ear canal normal.  Left Ear: Tympanic membrane, external ear and ear canal normal.  Nose: Nose normal.  Mouth/Throat: Uvula is midline, oropharynx  is clear and moist and mucous membranes are normal. No trismus in the jaw. Normal dentition. No dental abscesses or uvula swelling. No oropharyngeal exudate, posterior oropharyngeal erythema or tonsillar abscesses.  No trismus. No uvula deviation. No dental abscess. No signs of infection. Tolerating PO. Phonating well. Dental caries noted.   Eyes: Pupils are equal, round, and reactive to light. EOM are normal.  Neck: Normal range of motion. Neck supple. No tracheal deviation present.  Cardiovascular: Normal rate, regular rhythm, S1 normal, S2 normal, normal heart sounds, intact distal pulses and normal pulses.   Pulmonary/Chest: Effort normal and breath sounds normal. No respiratory distress. He has no decreased breath sounds. He has no wheezes. He has no rhonchi. He has no rales.  Abdominal: Normal appearance and bowel sounds are normal. There is no tenderness.  Musculoskeletal: Normal range of motion.  Left hand NVI. Distal pulses appreciated. No swelling. No erythema. No signs of infection. ROM intact.    Neurological: He is alert and oriented to person, place, and time.  Skin: Skin is warm and dry.  Psychiatric: He has a normal mood and affect. His speech is normal and behavior is normal. Thought content normal.  Nursing note and vitals reviewed.    ED Treatments / Results  Labs (all labs ordered are listed, but only abnormal results are displayed) Labs Reviewed - No data to display  EKG  EKG Interpretation None       Radiology No results found.  Procedures Procedures (including critical care time)  Medications Ordered in ED Medications - No data to display   Initial Impression / Assessment and Plan / ED Course  I have reviewed the triage vital signs and the nursing notes.  Pertinent labs & imaging results that were available during my care of the patient were reviewed by me and considered in my medical decision making (see chart for details).  Final Clinical  Impressions(s) / ED Diagnoses     {I have reviewed the relevant previous healthcare records.  {I obtained HPI from historian.   ED Course:  Assessment: Pt is a 46 y.o. male very well known to ED presents for hand pain. Hx same. Noted care plan. States left hand pain. Began in AM. No meds PTA. Rates pain 10/10. Throbbing. Pain with ROM. No trauma or injury to area. No falls. This is 27th ED visit for same. Pt homeless. No numbness/tingling. No fevers. No swelling. Pt also notes right sided jaw pain. No trouble with PO intake. Communicates well. No dental caries. No unilateral facial swelling.. On exam, pt in NAD. Nontoxic/nonseptic appearing. VSS. Afebrile. Left hand NVI. NO signs of infection or cellulitis. Left jaw without evidence of infection. No  dental abscess or caries. Tolerating PO. Full ROM of neck without pain. Given Tylenol in ED. Plan is to DC Home with follow up to PCP. At time of discharge, Patient is in no acute distress. Vital Signs are stable. Patient is able to ambulate. Patient able to tolerate PO.    Disposition/Plan:  DC Home Additional Verbal discharge instructions given and discussed with patient.  Pt Instructed to f/u with PCP in the next week for evaluation and treatment of symptoms. Return precautions given Pt acknowledges and agrees with plan  Supervising Physician Alvira MondaySchlossman, Erin, MD  Final diagnoses:  Chronic pain of left hand  Musculoskeletal pain  Dental caries    New Prescriptions New Prescriptions   No medications on file       Audry PiliMohr, Edon Hoadley, Cordelia Poche-C 11/01/16 2221    Alvira MondaySchlossman, Erin, MD 11/03/16 0005

## 2016-11-01 NOTE — ED Triage Notes (Signed)
Pt complains of left hand pain and right sided jaw, no injury noted

## 2016-11-03 ENCOUNTER — Encounter (HOSPITAL_COMMUNITY): Payer: Self-pay | Admitting: Emergency Medicine

## 2016-11-03 ENCOUNTER — Emergency Department (HOSPITAL_COMMUNITY)
Admission: EM | Admit: 2016-11-03 | Discharge: 2016-11-03 | Disposition: A | Discharge status: Home / Self Care | Payer: Self-pay | Attending: Emergency Medicine | Admitting: Emergency Medicine

## 2016-11-03 DIAGNOSIS — M2550 Pain in unspecified joint: Secondary | ICD-10-CM | POA: Insufficient documentation

## 2016-11-03 DIAGNOSIS — M25532 Pain in left wrist: Secondary | ICD-10-CM | POA: Insufficient documentation

## 2016-11-03 DIAGNOSIS — M7712 Lateral epicondylitis, left elbow: Secondary | ICD-10-CM | POA: Insufficient documentation

## 2016-11-03 DIAGNOSIS — G8929 Other chronic pain: Secondary | ICD-10-CM | POA: Insufficient documentation

## 2016-11-03 DIAGNOSIS — I10 Essential (primary) hypertension: Secondary | ICD-10-CM | POA: Insufficient documentation

## 2016-11-03 DIAGNOSIS — Z8673 Personal history of transient ischemic attack (TIA), and cerebral infarction without residual deficits: Secondary | ICD-10-CM | POA: Insufficient documentation

## 2016-11-03 DIAGNOSIS — F259 Schizoaffective disorder, unspecified: Secondary | ICD-10-CM | POA: Insufficient documentation

## 2016-11-03 DIAGNOSIS — F17210 Nicotine dependence, cigarettes, uncomplicated: Secondary | ICD-10-CM | POA: Insufficient documentation

## 2016-11-03 DIAGNOSIS — I252 Old myocardial infarction: Secondary | ICD-10-CM | POA: Insufficient documentation

## 2016-11-03 MED ORDER — IBUPROFEN 600 MG PO TABS: 600.0000 mg | ORAL_TABLET | Freq: Four times a day (QID) | ORAL | 0 refills | Status: DC | PRN

## 2016-11-03 NOTE — ED Triage Notes (Signed)
Patient arrived with EMS from street reports left hand pain this morning , denies injury , no pain or swelling .

## 2016-11-03 NOTE — Discharge Instructions (Addendum)
Please see the [facility] doctor for optimal care.  ICE THE ELBOW AND WRIST 4 TIMES A DAY FOR 10 MINUTES

## 2016-11-06 NOTE — ED Provider Notes (Signed)
[facility]-EMERGENCY DEPT Provider Note   CSN: [id number] Arrival date & time: 11/03/16  0424     History   Chief Complaint Chief Complaint  Patient presents with  . Hand Pain    HPI Dustin Hansen is a 46 y.o. male.  HPI Pt comes in with cc of L hand pain. Pt reports that his pain starts in the elbow and radiates down, and it is worse at night. Pt has been seen in the ER for this complain. He denies any numbness, tingling. Pain is worse with movement of the elbow and making a fist. No trauma.  Past Medical History:  Diagnosis Date  . Anxiety   . Arthritis 11/08/2011   "feet and knees"  . Back pain, chronic   . Bipolar 1 disorder (HCC)   . Burning with urination 11/08/2011  . Chronic bronchitis (HCC) 11/08/2011   "get it q month"  . Daily headache 11/08/2011  . Depression   . GERD (gastroesophageal reflux disease)   . Hepatitis 11/08/2011   "not sure which one"  . History of stomach ulcers 11/08/2011  . Homelessness   . Hypertension   . Mental disorder 11/08/2011   "I get crazy; I take medicine"  . Myocardial infarction [facility])    "think so; when I was young; used to go to heart dr"  . Pneumonia   . Schizo-affective schizophrenia (HCC)   . Seizures (HCC) 11/08/2011   "jerking kind"  . Shortness of breath 11/08/2011   "lying down"  . Stroke (HCC) 11/08/2011   points to right chest  . Thoracic aortic aneurysm [facility]) 2012   4.4cm in October 2017    Patient Active Problem List   Diagnosis Date Noted  . Chronic pancreatitis (HCC) 11/14/2011  . Cannabis abuse 11/09/2011  . Hepatitis 11/08/2011  . Hyperglycemia 11/08/2011  . Alcohol abuse 11/08/2011  . Transaminasemia 11/08/2011  . Elevated lipase 11/08/2011  . Chronic back pain 02/07/2011  . Insomnia 02/07/2011    Past Surgical History:  Procedure Laterality Date  . CHOLECYSTECTOMY  2008  . EUS  11/14/2011   Procedure: FULL UPPER ENDOSCOPIC ULTRASOUND (EUS) RADIAL;  Surgeon: Willis Modena, MD;  Location: [facility] END[facility];   Service: Endoscopy;  Laterality: N/A;  to be carelinked from [facility]       Home Medications    Prior to Admission medications   Medication Sig Start Date End Date Taking? Authorizing Provider  acetaminophen (TYLENOL) 500 MG tablet Take 1 tablet (500 mg total) by mouth every 6 (six) hours as needed. 10/30/16   Antony Madura, PA-C  ibuprofen (ADVIL,MOTRIN) 600 MG tablet Take 1 tablet (600 mg total) by mouth every 6 (six) hours as needed. 11/03/16   Derwood Kaplan, MD    Family History Family History  Problem Relation Age of Onset  . Hypertension Mother     Social History Social History  Substance Use Topics  . Smoking status: Current Every Day Smoker    Packs/day: 0.50    Years: 29.00    Types: Cigarettes  . Smokeless tobacco: Never Used  . Alcohol use Yes     Comment: 11/08/2011 "4-5 bottles beer/wk"     Allergies   Patient has no known allergies.   Review of Systems Review of Systems  Constitutional: Negative for fever.  Musculoskeletal: Positive for arthralgias.  Skin: Negative for rash and wound.  Allergic/Immunologic: Negative for immunocompromised state.     Physical Exam Updated Vital Signs BP (!) 160/111 (BP Location: Right Arm)   Pulse 73  Temp 97.6 F (36.4 C)   Resp 18   SpO2 99%   Physical Exam  Constitutional: He is oriented to person, place, and time. He appears well-developed.  HENT:  Head: Atraumatic.  Neck: Neck supple.  Cardiovascular: Normal rate.   Pulmonary/Chest: Effort normal.  Musculoskeletal:  On exam, pt has tenderness with palpation of L lateral epicondyle and the tenderness radiates to his L wrist. Hand exam reveals no edema, erythema or callor. There is no dislocation or fracture evidence. Pt's tenderness is worse with movement of the hand , wrist.  No ape hand, clawhand or wrist drop. Pt has grossly normal sensory exam.  Neurological: He is alert and oriented to person, place, and time.  Skin: Skin is warm.  Nursing note and  vitals reviewed.    ED Treatments / Results  Labs (all labs ordered are listed, but only abnormal results are displayed) Labs Reviewed - No data to display  EKG  EKG Interpretation None       Radiology No results found.  Procedures Procedures (including critical care time)  Medications Ordered in ED Medications - No data to display   Initial Impression / Assessment and Plan / ED Course  I have reviewed the triage vital signs and the nursing notes.  Pertinent labs & imaging results that were available during my care of the patient were reviewed by me and considered in my medical decision making (see chart for details).     Pt comes in with cc of hand pain, and based on my exam I suspect tennis elbow. We will give him a wrist brace and nsaids. [facility] f/u info given. RICE advised.   Final Clinical Impressions(s) / ED Diagnoses   Final diagnoses:  Other chronic pain  Lateral epicondylitis of left elbow    New Prescriptions Discharge Medication List as of 11/03/2016  7:44 AM       Derwood Kaplan, MD 11/06/16 0160

## 2016-11-08 ENCOUNTER — Emergency Department (HOSPITAL_COMMUNITY)
Admission: EM | Admit: 2016-11-08 | Discharge: 2016-11-08 | Disposition: A | Discharge status: Home / Self Care | Payer: Self-pay | Attending: Emergency Medicine | Admitting: Emergency Medicine

## 2016-11-08 DIAGNOSIS — Z5321 Procedure and treatment not carried out due to patient leaving prior to being seen by health care provider: Secondary | ICD-10-CM | POA: Insufficient documentation

## 2016-11-08 DIAGNOSIS — Z0471 Encounter for examination and observation following alleged adult physical abuse: Secondary | ICD-10-CM | POA: Insufficient documentation

## 2016-11-08 NOTE — ED Notes (Signed)
Pt called to be taken to room with no answer
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
## 2016-11-08 NOTE — ED Notes (Signed)
Pt name called for room with no answer

## 2016-12-17 ENCOUNTER — Emergency Department (HOSPITAL_COMMUNITY)
Admission: EM | Admit: 2016-12-17 | Discharge: 2016-12-17 | Disposition: A | Discharge status: Home / Self Care | Payer: Self-pay

## 2016-12-17 NOTE — ED Notes (Signed)
Pt placed in triage room but phlebotomist. No longer in room when this RN went in to complete triage.

## 2017-05-30 ENCOUNTER — Inpatient Hospital Stay: Payer: Medicaid Other | Admitting: Internal Medicine

## 2017-06-11 ENCOUNTER — Emergency Department (HOSPITAL_COMMUNITY)
Admission: EM | Admit: 2017-06-11 | Discharge: 2017-06-12 | Discharge status: Against Medical Advice | Payer: Medicaid Other

## 2017-06-12 ENCOUNTER — Encounter (HOSPITAL_COMMUNITY): Payer: Self-pay | Admitting: *Deleted

## 2017-06-12 ENCOUNTER — Emergency Department (HOSPITAL_COMMUNITY)
Admission: EM | Admit: 2017-06-12 | Discharge: 2017-06-12 | Disposition: A | Discharge status: Home / Self Care | Payer: Medicaid Other | Attending: Emergency Medicine | Admitting: Emergency Medicine

## 2017-06-12 ENCOUNTER — Other Ambulatory Visit: Payer: Self-pay

## 2017-06-12 DIAGNOSIS — M549 Dorsalgia, unspecified: Secondary | ICD-10-CM | POA: Diagnosis present

## 2017-06-12 DIAGNOSIS — Z5321 Procedure and treatment not carried out due to patient leaving prior to being seen by health care provider: Secondary | ICD-10-CM | POA: Diagnosis not present

## 2017-06-12 NOTE — ED Triage Notes (Signed)
Called for triage X 3 with no answer. 
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
## 2017-06-12 NOTE — ED Notes (Signed)
No answer in waiting room for vitals.   

## 2017-06-12 NOTE — ED Triage Notes (Signed)
Pt speaks some english and complaining of lower back pain since 2018 and headache all over.

## 2017-06-13 ENCOUNTER — Ambulatory Visit: Payer: Medicaid Other | Admitting: Internal Medicine

## 2017-06-13 ENCOUNTER — Encounter: Payer: Self-pay | Admitting: Internal Medicine

## 2017-06-13 VITALS — BP 125/80 | HR 79 | Temp 98.8°F | Wt 171.4 lb

## 2017-06-13 DIAGNOSIS — F319 Bipolar disorder, unspecified: Secondary | ICD-10-CM

## 2017-06-13 DIAGNOSIS — Z7689 Persons encountering health services in other specified circumstances: Secondary | ICD-10-CM | POA: Diagnosis not present

## 2017-06-13 DIAGNOSIS — Z720 Tobacco use: Secondary | ICD-10-CM

## 2017-06-13 DIAGNOSIS — I10 Essential (primary) hypertension: Secondary | ICD-10-CM | POA: Diagnosis not present

## 2017-06-13 DIAGNOSIS — F0150 Vascular dementia without behavioral disturbance: Secondary | ICD-10-CM | POA: Diagnosis not present

## 2017-06-13 DIAGNOSIS — K089 Disorder of teeth and supporting structures, unspecified: Secondary | ICD-10-CM | POA: Diagnosis not present

## 2017-06-13 DIAGNOSIS — L219 Seborrheic dermatitis, unspecified: Secondary | ICD-10-CM

## 2017-06-13 DIAGNOSIS — I712 Thoracic aortic aneurysm, without rupture, unspecified: Secondary | ICD-10-CM

## 2017-06-13 DIAGNOSIS — F0390 Unspecified dementia without behavioral disturbance: Secondary | ICD-10-CM

## 2017-06-13 NOTE — Patient Instructions (Signed)
Thank you for coming in today.  Please return within the next month with medication bottles. We will plan to do health screening labs at that time as well.  An 8:30 am or 1:30 pm appointment may be preferable for you to avoid having to be in lobby too long.  Best, Dr. Sampson GoonFitzgerald

## 2017-06-15 DIAGNOSIS — L219 Seborrheic dermatitis, unspecified: Secondary | ICD-10-CM | POA: Insufficient documentation

## 2017-06-15 DIAGNOSIS — F0390 Unspecified dementia without behavioral disturbance: Secondary | ICD-10-CM | POA: Insufficient documentation

## 2017-06-15 DIAGNOSIS — F0150 Vascular dementia without behavioral disturbance: Secondary | ICD-10-CM | POA: Insufficient documentation

## 2017-06-15 DIAGNOSIS — F319 Bipolar disorder, unspecified: Secondary | ICD-10-CM | POA: Insufficient documentation

## 2017-06-15 DIAGNOSIS — I10 Essential (primary) hypertension: Secondary | ICD-10-CM | POA: Insufficient documentation

## 2017-06-15 MED ORDER — AMLODIPINE BESYLATE 5 MG PO TABS: 5.0000 mg | ORAL_TABLET | Freq: Every day | ORAL | 0 refills | Status: AC
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
## 2017-06-16 ENCOUNTER — Encounter: Payer: Self-pay | Admitting: Internal Medicine

## 2017-06-16 DIAGNOSIS — K089 Disorder of teeth and supporting structures, unspecified: Secondary | ICD-10-CM | POA: Insufficient documentation

## 2017-06-16 DIAGNOSIS — I712 Thoracic aortic aneurysm, without rupture, unspecified: Secondary | ICD-10-CM | POA: Insufficient documentation

## 2017-06-16 DIAGNOSIS — Z720 Tobacco use: Secondary | ICD-10-CM | POA: Insufficient documentation

## 2017-06-16 NOTE — Assessment & Plan Note (Addendum)
-   Controlled today. Has been on amlodipine. Unsure if currently taking.  - Asked family to bring medication bottles to next appointment.

## 2017-06-16 NOTE — Assessment & Plan Note (Signed)
-   Due for CTA chest vs MRA for routine monitoring. Noticed this after visit. Will ask for father to be called for scheduling.

## 2017-06-16 NOTE — Assessment & Plan Note (Signed)
-   Follows with [facility]. Receiving haldol shots monthly for psychosis.  - Requires regular LFT and plt monitoring.

## 2017-06-16 NOTE — Assessment & Plan Note (Signed)
-   If continues to smoke, will be due for low dose lung CT screening at age 47

## 2017-06-19 ENCOUNTER — Emergency Department (HOSPITAL_COMMUNITY): Payer: Medicaid Other

## 2017-06-19 ENCOUNTER — Encounter (HOSPITAL_COMMUNITY): Payer: Self-pay | Admitting: Emergency Medicine

## 2017-06-19 ENCOUNTER — Emergency Department (HOSPITAL_COMMUNITY)
Admission: EM | Admit: 2017-06-19 | Discharge: 2017-06-19 | Disposition: A | Discharge status: Home / Self Care | Payer: Medicaid Other | Attending: Emergency Medicine | Admitting: Emergency Medicine

## 2017-06-19 ENCOUNTER — Other Ambulatory Visit: Payer: Self-pay

## 2017-06-19 ENCOUNTER — Telehealth: Payer: Self-pay | Admitting: *Deleted

## 2017-06-19 DIAGNOSIS — R51 Headache: Secondary | ICD-10-CM | POA: Diagnosis not present

## 2017-06-19 DIAGNOSIS — R4182 Altered mental status, unspecified: Secondary | ICD-10-CM | POA: Diagnosis present

## 2017-06-19 DIAGNOSIS — Z79899 Other long term (current) drug therapy: Secondary | ICD-10-CM | POA: Insufficient documentation

## 2017-06-19 DIAGNOSIS — F10920 Alcohol use, unspecified with intoxication, uncomplicated: Secondary | ICD-10-CM | POA: Insufficient documentation

## 2017-06-19 DIAGNOSIS — I10 Essential (primary) hypertension: Secondary | ICD-10-CM | POA: Diagnosis not present

## 2017-06-19 DIAGNOSIS — F17210 Nicotine dependence, cigarettes, uncomplicated: Secondary | ICD-10-CM | POA: Diagnosis not present

## 2017-06-19 LAB — COMPREHENSIVE METABOLIC PANEL
ALBUMIN: 3.6 g/dL (ref 3.5–5.0)
ALT: 12 U/L — AB (ref 17–63)
AST: 18 U/L (ref 15–41)
Alkaline Phosphatase: 43 U/L (ref 38–126)
Anion gap: 14 (ref 5–15)
BUN: 11 mg/dL (ref 6–20)
CHLORIDE: 104 mmol/L (ref 101–111)
CO2: 22 mmol/L (ref 22–32)
CREATININE: 0.73 mg/dL (ref 0.61–1.24)
Calcium: 8.8 mg/dL — ABNORMAL LOW (ref 8.9–10.3)
GFR calc Af Amer: >60 mL/min (ref >60)
GFR calc non Af Amer: >60 mL/min (ref >60)
Glucose, Bld: 98 mg/dL (ref 65–99)
Potassium: 3.6 mmol/L (ref 3.5–5.1)
SODIUM: 140 mmol/L (ref 135–145)
Total Bilirubin: 0.5 mg/dL (ref 0.3–1.2)
Total Protein: 6 g/dL — ABNORMAL LOW (ref 6.5–8.1)

## 2017-06-19 LAB — CBC
HCT: 45.1 % (ref 39.0–52.0)
Hemoglobin: 15.2 g/dL (ref 13.0–17.0)
MCH: 30.2 pg (ref 26.0–34.0)
MCHC: 33.7 g/dL (ref 30.0–36.0)
MCV: 89.7 fL (ref 78.0–100.0)
Platelets: 178 10*3/uL (ref 150–400)
RBC: 5.03 MIL/uL (ref 4.22–5.81)
RDW: 12.6 % (ref 11.5–15.5)
WBC: 8.6 10*3/uL (ref 4.0–10.5)

## 2017-06-19 LAB — ETHANOL: Alcohol, Ethyl (B): 54 mg/dL — ABNORMAL HIGH (ref <10)

## 2017-06-19 LAB — RAPID URINE DRUG SCREEN, HOSP PERFORMED
Amphetamines: NOT DETECTED
BARBITURATES: NOT DETECTED
Benzodiazepines: NOT DETECTED
Cocaine: NOT DETECTED
Opiates: NOT DETECTED
TETRAHYDROCANNABINOL: NOT DETECTED

## 2017-06-19 LAB — ACETAMINOPHEN LEVEL

## 2017-06-19 LAB — SALICYLATE LEVEL: Salicylate Lvl: <7 mg/dL (ref 2.8–30.0)

## 2017-06-19 NOTE — ED Notes (Signed)
ED Provider at bedside. 

## 2017-06-19 NOTE — Telephone Encounter (Signed)
Scheduled pt appt. Looks like he just got discharged from the ED will mail appt and call him in a couple days. Bryker Fletchall Bruna PotterBlount, CMA

## 2017-06-19 NOTE — ED Notes (Signed)
Pt verbalized understanding of discharge instructions. Pt denied any further questions or needs.

## 2017-06-19 NOTE — ED Triage Notes (Signed)
Pt brought in [facility] for c/o headache, generalized pain, and ETOH intoxication. BP 125/85 P 96 O2 96%RA CBG 122
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
## 2017-06-19 NOTE — ED Notes (Signed)
Patient transported to CT 

## 2017-06-21 ENCOUNTER — Telehealth: Payer: Self-pay

## 2017-06-21 NOTE — Telephone Encounter (Signed)
Dustin Hansen with [facility] scheduling left a message that she needs order for CT angio chest faxed to [phone].  Call back is [phone].  Ples SpecterAlisa Soriyah Osberg, RN [facility] RN)

## 2017-06-24 ENCOUNTER — Emergency Department (HOSPITAL_COMMUNITY)
Admission: EM | Admit: 2017-06-24 | Discharge: 2017-06-24 | Discharge status: Absent without leave | Payer: Medicaid Other

## 2017-06-24 NOTE — ED Triage Notes (Signed)
Pt called for triage, no answer.   Pt was brought by EMS and taken to lobby due to volume. Unable to find pt at this time.

## 2017-06-24 NOTE — ED Notes (Signed)
Patient called multiple times and no answer  

## 2017-06-25 NOTE — Telephone Encounter (Signed)
Faxed to [facility]. Yun Gutierrez Bruna PotterBlount, CMA

## 2017-06-26 ENCOUNTER — Ambulatory Visit (HOSPITAL_COMMUNITY)
Admission: RE | Admit: 2017-06-26 | Discharge: 2017-06-26 | Disposition: A | Discharge status: Home / Self Care | Payer: Medicaid Other | Source: Ambulatory Visit | Attending: Family Medicine | Admitting: Family Medicine

## 2017-06-26 ENCOUNTER — Emergency Department (HOSPITAL_COMMUNITY)
Admission: EM | Admit: 2017-06-26 | Discharge: 2017-06-26 | Disposition: A | Discharge status: Home / Self Care | Payer: Medicaid Other | Attending: Emergency Medicine | Admitting: Emergency Medicine

## 2017-06-26 ENCOUNTER — Other Ambulatory Visit: Payer: Self-pay

## 2017-06-26 DIAGNOSIS — R109 Unspecified abdominal pain: Secondary | ICD-10-CM | POA: Insufficient documentation

## 2017-06-26 DIAGNOSIS — I700 Atherosclerosis of aorta: Secondary | ICD-10-CM | POA: Insufficient documentation

## 2017-06-26 DIAGNOSIS — Z5321 Procedure and treatment not carried out due to patient leaving prior to being seen by health care provider: Secondary | ICD-10-CM | POA: Diagnosis not present

## 2017-06-26 DIAGNOSIS — I712 Thoracic aortic aneurysm, without rupture, unspecified: Secondary | ICD-10-CM

## 2017-06-26 DIAGNOSIS — R51 Headache: Secondary | ICD-10-CM | POA: Insufficient documentation

## 2017-06-26 MED ORDER — IOPAMIDOL (ISOVUE-370) INJECTION 76%
INTRAVENOUS | Status: AC
  Administered 2017-06-26: 80 mL via INTRAVENOUS
  Filled 2017-06-26: qty 100

## 2017-06-26 MED ORDER — IOPAMIDOL (ISOVUE-370) INJECTION 76%: 100.0000 mL | Freq: Once | INTRAVENOUS | Status: DC | PRN

## 2017-06-26 NOTE — ED Triage Notes (Signed)
Pt to ED with c/o headache and abd pain onset yesterday

## 2017-06-26 NOTE — ED Notes (Signed)
Pt called for triage x2, no answer. 

## 2017-06-27 ENCOUNTER — Encounter (HOSPITAL_COMMUNITY): Payer: Self-pay | Admitting: Emergency Medicine

## 2017-06-27 ENCOUNTER — Emergency Department (HOSPITAL_COMMUNITY)
Admission: EM | Admit: 2017-06-27 | Discharge: 2017-06-27 | Disposition: A | Discharge status: Home / Self Care | Payer: Medicaid Other | Attending: Emergency Medicine | Admitting: Emergency Medicine

## 2017-06-27 DIAGNOSIS — R51 Headache: Secondary | ICD-10-CM | POA: Insufficient documentation

## 2017-06-27 DIAGNOSIS — I10 Essential (primary) hypertension: Secondary | ICD-10-CM | POA: Insufficient documentation

## 2017-06-27 DIAGNOSIS — R519 Headache, unspecified: Secondary | ICD-10-CM

## 2017-06-27 DIAGNOSIS — Z8673 Personal history of transient ischemic attack (TIA), and cerebral infarction without residual deficits: Secondary | ICD-10-CM | POA: Diagnosis not present

## 2017-06-27 DIAGNOSIS — F17210 Nicotine dependence, cigarettes, uncomplicated: Secondary | ICD-10-CM | POA: Diagnosis not present

## 2017-06-27 DIAGNOSIS — I252 Old myocardial infarction: Secondary | ICD-10-CM | POA: Diagnosis not present

## 2017-06-27 DIAGNOSIS — Z79899 Other long term (current) drug therapy: Secondary | ICD-10-CM | POA: Diagnosis not present

## 2017-06-27 MED ORDER — IBUPROFEN 400 MG PO TABS
600.0000 mg | ORAL_TABLET | Freq: Once | ORAL | Status: AC
  Administered 2017-06-27: 08:00:00 600 mg via ORAL
  Filled 2017-06-27: qty 1

## 2017-06-27 NOTE — ED Notes (Signed)
See physician note for assessment

## 2017-06-27 NOTE — ED Triage Notes (Signed)
Pt reports a headache.  Pt denies ETOH, however strong suspicion.  Pt reports he just wants to sleep for a while.

## 2017-06-27 NOTE — ED Provider Notes (Signed)
[facility]ERGENC[facility] Provider Note   CSN: [id number] Arrival date & time: 06/27/17  0030     History   Chief Complaint Chief Complaint  Patient presents with  . Headache  . Alcohol Intoxication    HPI Dustin Hansen is a 47 y.o. male.  HPI  47 year old male presents with headache.  He states his left occipital.  He states this started last night and has progressively worsened.  He has had this similar headache for "years".  He denies any known cause.  He denies recent alcohol use tonight.  No vomiting or focal weakness.  He has had a little bit of sneezing and mild cough.  Subjective fever.  Has not taken anything for the headache. Headache is rated as severe.  Past Medical History:  Diagnosis Date  . Anxiety   . Arthritis 11/08/2011   "feet and knees"  . Back pain, chronic   . Bipolar 1 disorder (HCC)   . Burning with urination 11/08/2011  . Chronic bronchitis (HCC) 11/08/2011   "get it q month"  . Daily headache 11/08/2011  . Depression   . GERD (gastroesophageal reflux disease)   . Hepatitis 11/08/2011   "not sure which one"  . History of stomach ulcers 11/08/2011  . Homelessness   . Hypertension   . Mental disorder 11/08/2011   "I get crazy; I take medicine"  . Myocardial infarction [facility](HCC)    "think so; when I was young; used to go to heart dr"  . Pneumonia   . Schizo-affective schizophrenia (HCC)   . Seizures (HCC) 11/08/2011   "jerking kind"  . Shortness of breath 11/08/2011   "lying down"  . Stroke (HCC) 11/08/2011   points to right chest  . Thoracic aortic aneurysm [facility](HCC) 2012   4.4cm in October 2017    Patient Active Problem List   Diagnosis Date Noted  . Tobacco abuse 06/16/2017  . Poor dentition 06/16/2017  . Thoracic aortic aneurysm (HCC) 06/16/2017  . Bipolar disorder (HCC) 06/15/2017  . Seborrheic dermatitis 06/15/2017  . HTN (hypertension) 06/15/2017  . Major neurocognitive disorder 06/15/2017  . Chronic pancreatitis (HCC)  11/14/2011  . Cannabis abuse 11/09/2011  . Hepatitis 11/08/2011  . Hyperglycemia 11/08/2011  . Alcohol abuse 11/08/2011  . Transaminasemia 11/08/2011  . Elevated lipase 11/08/2011  . Chronic back pain 02/07/2011  . Insomnia 02/07/2011    Past Surgical History:  Procedure Laterality Date  . CHOLECYSTECTOMY  2008  . EUS  11/14/2011   Procedure: FULL UPPER ENDOSCOPIC ULTRASOUND (EUS) RADIAL;  Surgeon: Willis ModenaWilliam Outlaw, MD;  Location[facility];  Service: Endoscopy;  Laterality: N/A;  [facility]        Home Medications    Prior to Admission medications   Medication Sig Start Date End Date Taking? Authorizing Provider  acetaminophen (TYLENOL) 500 MG tablet Take 1 tablet (500 mg total) by mouth every 6 (six) hours as needed. 10/30/16   Antony MaduraHumes, Kelly, PA-C  amLODipine (NORVASC) 5 MG tablet Take 1 tablet (5 mg total) by mouth daily. 06/15/17   Casey BurkittFitzgerald, Hillary Moen, MD  divalproex (DEPAKOTE) 500 MG DR tablet Take 1,000 mg by mouth at bedtime.    [provider]  fluticasone (FLONASE) 50 MCG/ACT nasal spray Place into both nostrils daily.    [provider]  haloperidol decanoate (HALDOL DECANOATE) 50 MG/ML injection Inject 50 mg into the muscle every 28 (twenty-eight) days.    [provider]    Family History Family History  Problem Relation Age of Onset  . Hypertension Mother     Social History Social History   Tobacco Use  . Smoking status: Current Every Day Smoker    Packs/day: 0.50    Years: 29.00    Pack years: 14.50    Types: Cigarettes  . Smokeless tobacco: Never Used  Substance Use Topics  . Alcohol use: Yes    Comment: 11/08/2011 "4-5 bottles beer/wk"  . Drug use: Not Currently     Allergies   Patient has no known allergies.   Review of Systems Review of Systems  Constitutional: Positive for fever (subjective).  HENT: Positive for sneezing.   Respiratory: Positive for cough. Negative for shortness of breath.     Gastrointestinal: Negative for vomiting.  Neurological: Positive for headaches.  All other systems reviewed and are negative.    Physical Exam Updated Vital Signs BP (!) 151/111 (BP Location: Right Arm)   Pulse 77   Temp 98.3 F (36.8 C) (Oral)   Resp 16   Ht 5\' 5"  (1.651 m)   Wt 77.1 kg (170 lb)   SpO2 97%   BMI 28.29 kg/m   Physical Exam  Constitutional: He is oriented to person, place, and time. He appears well-developed and well-nourished.  Non-toxic appearance. He does not appear ill. No distress.  HENT:  Head: Normocephalic and atraumatic.  Right Ear: External ear normal.  Left Ear: External ear normal.  Nose: Nose normal.  Eyes: Pupils are equal, round, and reactive to light. EOM are normal. Right eye exhibits no discharge. Left eye exhibits no discharge.  Neck: Normal range of motion. Neck supple.  Cardiovascular: Normal rate, regular rhythm and normal heart sounds.  Pulmonary/Chest: Effort normal and breath sounds normal. He has no wheezes. He has no rales.  Abdominal: Soft. There is no tenderness.  Musculoskeletal: He exhibits no edema.  Neurological: He is alert and oriented to person, place, and time.  CN 3-12 grossly intact. 5/5 strength in all 4 extremities. Grossly normal sensation. Normal finger to nose. Normal gait  Skin: Skin is warm and dry.  Nursing note and vitals reviewed.    ED Treatments / Results  Labs (all labs ordered are listed, but only abnormal results are displayed) Labs Reviewed - No data to display  EKG None  Radiology No results found.  Procedures Procedures (including critical care time)  Medications Ordered in ED Medications  ibuprofen (ADVIL,MOTRIN) tablet 600 mg (has no administration in time range)     Initial Impression / Assessment and Plan / ED Course  I have reviewed the triage vital signs and the nursing notes.  Pertinent labs & imaging results that were available during my care of the patient were reviewed by  me and considered in my medical decision making (see chart for details).     Patient is resting comfortably and appears well.  His neurologic exam is benign.  He states he has had this headache for quite some time.  My suspicion for an acute CNS emergency such as head bleed or meningitis/encephalitis is quite low.  He reports a subjective fever although he is afebrile now.  He has had some URI symptoms with sneezing and mild cough that is nonproductive.  His lungs are clear.  This could be exacerbating a headache.  I will give him ibuprofen and he will need to follow-up with PCP.  However I doubt emergent medical condition at this time.  Discharge home with return precautions.  Final Clinical Impressions(s) / ED Diagnoses  Final diagnoses:  Left-sided headache    ED Discharge Orders    None       Pricilla Loveless, MD 06/27/17 [phone]

## 2017-07-02 ENCOUNTER — Other Ambulatory Visit: Payer: Self-pay

## 2017-07-02 ENCOUNTER — Encounter (HOSPITAL_COMMUNITY): Payer: Self-pay | Admitting: Emergency Medicine

## 2017-07-02 ENCOUNTER — Emergency Department (HOSPITAL_COMMUNITY)
Admission: EM | Admit: 2017-07-02 | Discharge: 2017-07-03 | Disposition: A | Discharge status: Home / Self Care | Payer: Medicaid Other | Attending: Emergency Medicine | Admitting: Emergency Medicine

## 2017-07-02 DIAGNOSIS — R110 Nausea: Secondary | ICD-10-CM | POA: Diagnosis not present

## 2017-07-02 DIAGNOSIS — Z5321 Procedure and treatment not carried out due to patient leaving prior to being seen by health care provider: Secondary | ICD-10-CM | POA: Insufficient documentation

## 2017-07-02 DIAGNOSIS — R109 Unspecified abdominal pain: Secondary | ICD-10-CM | POA: Diagnosis not present

## 2017-07-02 LAB — CBC
HCT: 50.6 % (ref 39.0–52.0)
Hemoglobin: 17.6 g/dL — ABNORMAL HIGH (ref 13.0–17.0)
MCH: 30.4 pg (ref 26.0–34.0)
MCHC: 34.8 g/dL (ref 30.0–36.0)
MCV: 87.5 fL (ref 78.0–100.0)
PLATELETS: 182 10*3/uL (ref 150–400)
RBC: 5.78 MIL/uL (ref 4.22–5.81)
RDW: 13 % (ref 11.5–15.5)
WBC: 6.7 10*3/uL (ref 4.0–10.5)

## 2017-07-02 LAB — COMPREHENSIVE METABOLIC PANEL
ALBUMIN: 4.4 g/dL (ref 3.5–5.0)
ALK PHOS: 56 U/L (ref 38–126)
ALT: 14 U/L — AB (ref 17–63)
AST: 27 U/L (ref 15–41)
Anion gap: 13 (ref 5–15)
BUN: 5 mg/dL — AB (ref 6–20)
CALCIUM: 9.1 mg/dL (ref 8.9–10.3)
CO2: 20 mmol/L — AB (ref 22–32)
CREATININE: 0.88 mg/dL (ref 0.61–1.24)
Chloride: 104 mmol/L (ref 101–111)
GFR calc Af Amer: >60 mL/min (ref >60)
GFR calc non Af Amer: >60 mL/min (ref >60)
GLUCOSE: 91 mg/dL (ref 65–99)
Potassium: 3.9 mmol/L (ref 3.5–5.1)
SODIUM: 137 mmol/L (ref 135–145)
Total Bilirubin: 1 mg/dL (ref 0.3–1.2)
Total Protein: 7.5 g/dL (ref 6.5–8.1)

## 2017-07-02 LAB — LIPASE, BLOOD: Lipase: 28 U/L (ref 11–51)

## 2017-07-02 NOTE — ED Triage Notes (Signed)
Pt states he started having abd pain today with nausea.

## 2017-07-03 NOTE — ED Notes (Signed)
Pt visualized walking out of lobby to ED parking lot

## 2017-07-17 ENCOUNTER — Ambulatory Visit: Payer: Medicaid Other | Admitting: Internal Medicine

## 2017-07-18 ENCOUNTER — Encounter: Payer: Self-pay | Admitting: Internal Medicine

## 2017-07-19 ENCOUNTER — Emergency Department (HOSPITAL_COMMUNITY)
Admission: EM | Admit: 2017-07-19 | Discharge: 2017-07-19 | Disposition: A | Discharge status: Home / Self Care | Payer: Medicaid Other | Attending: Emergency Medicine | Admitting: Emergency Medicine

## 2017-07-19 ENCOUNTER — Encounter (HOSPITAL_COMMUNITY): Payer: Self-pay

## 2017-07-19 DIAGNOSIS — R5381 Other malaise: Secondary | ICD-10-CM | POA: Diagnosis present

## 2017-07-19 DIAGNOSIS — Z5321 Procedure and treatment not carried out due to patient leaving prior to being seen by health care provider: Secondary | ICD-10-CM | POA: Diagnosis not present

## 2017-07-19 NOTE — ED Notes (Signed)
Unable to locate pt in lobby  

## 2017-07-19 NOTE — ED Triage Notes (Signed)
Pt states that he is having pain everywhere all day. Denies n/v/d/fevers.
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
## 2017-07-21 ENCOUNTER — Other Ambulatory Visit: Payer: Self-pay

## 2017-07-21 ENCOUNTER — Encounter (HOSPITAL_COMMUNITY): Payer: Self-pay | Admitting: *Deleted

## 2017-07-21 ENCOUNTER — Emergency Department (HOSPITAL_COMMUNITY)
Admission: EM | Admit: 2017-07-21 | Discharge: 2017-07-21 | Disposition: A | Discharge status: Home / Self Care | Payer: Medicaid Other | Attending: Emergency Medicine | Admitting: Emergency Medicine

## 2017-07-21 DIAGNOSIS — Z5321 Procedure and treatment not carried out due to patient leaving prior to being seen by health care provider: Secondary | ICD-10-CM | POA: Diagnosis not present

## 2017-07-21 DIAGNOSIS — M791 Myalgia, unspecified site: Secondary | ICD-10-CM | POA: Insufficient documentation

## 2017-07-21 NOTE — ED Triage Notes (Signed)
The pt is c/o pain everywhere since yesterday  He has been seen here earlier today  Very poor hygiene   Answers in one or two  Words to questions asked

## 2017-08-06 ENCOUNTER — Emergency Department (HOSPITAL_COMMUNITY)
Admission: EM | Admit: 2017-08-06 | Discharge: 2017-08-06 | Discharge status: Against Medical Advice | Payer: Medicaid Other

## 2017-08-06 NOTE — ED Notes (Signed)
Pt name called x3 with no response. 

## 2017-08-06 NOTE — ED Notes (Signed)
Name called x 2-no response

## 2017-08-06 NOTE — ED Triage Notes (Signed)
Per EMS:  Patient c/o LUQ abdominal pain with burning, described as "hunger pangs" to EMS.  Patient states he has not eaten in 2 days and is homeless.  Pt denies n/v/d or changes in urination

## 2017-08-09 ENCOUNTER — Encounter (HOSPITAL_COMMUNITY): Payer: Self-pay | Admitting: Emergency Medicine

## 2017-08-09 ENCOUNTER — Emergency Department (HOSPITAL_COMMUNITY)
Admission: EM | Admit: 2017-08-09 | Discharge: 2017-08-09 | Disposition: A | Discharge status: Home / Self Care | Payer: Medicaid Other | Attending: Emergency Medicine | Admitting: Emergency Medicine

## 2017-08-09 DIAGNOSIS — Z5321 Procedure and treatment not carried out due to patient leaving prior to being seen by health care provider: Secondary | ICD-10-CM | POA: Diagnosis not present

## 2017-08-09 DIAGNOSIS — F10929 Alcohol use, unspecified with intoxication, unspecified: Secondary | ICD-10-CM | POA: Insufficient documentation

## 2017-08-09 NOTE — ED Notes (Signed)
Pt seen leaving ED by [facility]
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
## 2017-08-19 ENCOUNTER — Encounter (HOSPITAL_COMMUNITY): Payer: Self-pay | Admitting: *Deleted

## 2017-08-19 ENCOUNTER — Emergency Department (HOSPITAL_COMMUNITY)
Admission: EM | Admit: 2017-08-19 | Discharge: 2017-08-19 | Disposition: A | Discharge status: Home / Self Care | Payer: Medicaid Other | Attending: Emergency Medicine | Admitting: Emergency Medicine

## 2017-08-19 DIAGNOSIS — F329 Major depressive disorder, single episode, unspecified: Secondary | ICD-10-CM | POA: Diagnosis not present

## 2017-08-19 DIAGNOSIS — F319 Bipolar disorder, unspecified: Secondary | ICD-10-CM | POA: Insufficient documentation

## 2017-08-19 DIAGNOSIS — I10 Essential (primary) hypertension: Secondary | ICD-10-CM | POA: Diagnosis not present

## 2017-08-19 DIAGNOSIS — F10120 Alcohol abuse with intoxication, uncomplicated: Secondary | ICD-10-CM | POA: Insufficient documentation

## 2017-08-19 DIAGNOSIS — I252 Old myocardial infarction: Secondary | ICD-10-CM | POA: Diagnosis not present

## 2017-08-19 DIAGNOSIS — Z590 Homelessness: Secondary | ICD-10-CM | POA: Diagnosis not present

## 2017-08-19 DIAGNOSIS — F10920 Alcohol use, unspecified with intoxication, uncomplicated: Secondary | ICD-10-CM

## 2017-08-19 DIAGNOSIS — F17210 Nicotine dependence, cigarettes, uncomplicated: Secondary | ICD-10-CM | POA: Insufficient documentation

## 2017-08-19 DIAGNOSIS — Z9049 Acquired absence of other specified parts of digestive tract: Secondary | ICD-10-CM | POA: Insufficient documentation

## 2017-08-19 DIAGNOSIS — Z8673 Personal history of transient ischemic attack (TIA), and cerebral infarction without residual deficits: Secondary | ICD-10-CM | POA: Insufficient documentation

## 2017-08-19 DIAGNOSIS — F419 Anxiety disorder, unspecified: Secondary | ICD-10-CM | POA: Diagnosis not present

## 2017-08-19 DIAGNOSIS — F1010 Alcohol abuse, uncomplicated: Secondary | ICD-10-CM

## 2017-08-19 DIAGNOSIS — Z79899 Other long term (current) drug therapy: Secondary | ICD-10-CM | POA: Diagnosis not present

## 2017-08-19 NOTE — ED Notes (Signed)
Bed: WTR6 Expected date:  Expected time:  Means of arrival:  Comments: 

## 2017-08-19 NOTE — ED Triage Notes (Signed)
Per [facility], [place]  Pt is intoxicated.

## 2017-08-19 NOTE — ED Provider Notes (Signed)
[facility]-EMERGENCY DEPT Provider Note   CSN: [id number] Arrival date & time: 08/19/17  2129     History   Chief Complaint Chief Complaint  Patient presents with  . Alcohol Intoxication    HPI Dustin Hansen is a 47 y.o. male.  Patient with hx etoh abuse, with etoh use this PM. Pt awake and alert - is eating and drinking in ED. Patient denies pain or injury. No headache. No neck or back pain. Denies abd pain or vomiting. Normal appetite. Denies depression or any thoughts of self harm.   The history is provided by the patient and the EMS personnel.  Alcohol Intoxication  Pertinent negatives include no chest pain, no abdominal pain, no headaches and no shortness of breath.    Past Medical History:  Diagnosis Date  . Anxiety   . Arthritis 11/08/2011   "feet and knees"  . Back pain, chronic   . Bipolar 1 disorder (HCC)   . Burning with urination 11/08/2011  . Chronic bronchitis (HCC) 11/08/2011   "get it q month"  . Daily headache 11/08/2011  . Depression   . GERD (gastroesophageal reflux disease)   . Hepatitis 11/08/2011   "not sure which one"  . History of stomach ulcers 11/08/2011  . Homelessness   . Hypertension   . Mental disorder 11/08/2011   "I get crazy; I take medicine"  . Myocardial infarction [facility])    "think so; when I was young; used to go to heart dr"  . Pneumonia   . Schizo-affective schizophrenia (HCC)   . Seizures (HCC) 11/08/2011   "jerking kind"  . Shortness of breath 11/08/2011   "lying down"  . Stroke (HCC) 11/08/2011   points to right chest  . Thoracic aortic aneurysm [facility]) 2012   4.4cm in October 2017    Patient Active Problem List   Diagnosis Date Noted  . Tobacco abuse 06/16/2017  . Poor dentition 06/16/2017  . Thoracic aortic aneurysm (HCC) 06/16/2017  . Bipolar disorder (HCC) 06/15/2017  . Seborrheic dermatitis 06/15/2017  . HTN (hypertension) 06/15/2017  . Major neurocognitive disorder 06/15/2017  . Chronic pancreatitis  (HCC) 11/14/2011  . Cannabis abuse 11/09/2011  . Hepatitis 11/08/2011  . Hyperglycemia 11/08/2011  . Alcohol abuse 11/08/2011  . Transaminasemia 11/08/2011  . Elevated lipase 11/08/2011  . Chronic back pain 02/07/2011  . Insomnia 02/07/2011    Past Surgical History:  Procedure Laterality Date  . CHOLECYSTECTOMY  2008  . EUS  11/14/2011   Procedure: FULL UPPER ENDOSCOPIC ULTRASOUND (EUS) RADIAL;  Surgeon: Willis Modena, MD;  Location: [facility] ENDOS[facility];  Service: Endoscopy;  Laterality: N/A;  [facility] be carelinked from [facility]        Home Medications    Prior to Admission medications   Medication Sig Start Date End Date Taking? Authorizing Provider  acetaminophen (TYLENOL) 500 MG tablet Take 1 tablet (500 mg total) by mouth every 6 (six) hours as needed. 10/30/16   Antony Madura, PA-C  amLODipine (NORVASC) 5 MG tablet Take 1 tablet (5 mg total) by mouth daily. 06/15/17   Casey Burkitt, MD  divalproex (DEPAKOTE) 500 MG DR tablet Take 1,000 mg by mouth at bedtime.    [provider]  fluticasone (FLONASE) 50 MCG/ACT nasal spray Place into both nostrils daily.    [provider]  haloperidol decanoate (HALDOL DECANOATE) 50 MG/ML injection Inject 50 mg into the muscle every 28 (twenty-eight) days.    [provider]    Family History Family History  Problem Relation Age of Onset  . Hypertension Mother     Social History Social History   Tobacco Use  . Smoking status: Current Every Day Smoker    Packs/day: 0.50    Years: 29.00    Pack years: 14.50    Types: Cigarettes  . Smokeless tobacco: Never Used  Substance Use Topics  . Alcohol use: Yes    Comment: 11/08/2011 "4-5 bottles beer/wk"  . Drug use: Not Currently     Allergies   Patient has no known allergies.   Review of Systems Review of Systems  Constitutional: Negative for fever.  Respiratory: Negative for shortness of breath.   Cardiovascular: Negative for chest pain.  Gastrointestinal:  Negative for abdominal pain and vomiting.  Musculoskeletal: Negative for back pain and neck pain.  Skin: Negative for wound.  Neurological: Negative for headaches.     Physical Exam Updated Vital Signs BP 103/73 (BP Location: Left Arm)   Pulse 82   Temp 98.3 F (36.8 C) (Oral)   Resp 18   SpO2 99%   Physical Exam  Constitutional: He appears well-developed and well-nourished.  HENT:  Mouth/Throat: Oropharynx is clear and moist.  Eyes: Conjunctivae are normal. No scleral icterus.  Neck: Neck supple. No tracheal deviation present.  Cardiovascular: Normal rate, regular rhythm, normal heart sounds and intact distal pulses.  Pulmonary/Chest: Effort normal and breath sounds normal. No accessory muscle usage. No respiratory distress.  Abdominal: Soft. Bowel sounds are normal. He exhibits no distension. There is no tenderness.  Musculoskeletal: He exhibits no edema.  CTLS spine, non tender, aligned, no step off. Good rom bil extremities without pain or focal bony tenderness.   Neurological: He is alert.  Awake and alert. Speech clear. Ambulates w steady gait.   Skin: Skin is warm and dry. He is not diaphoretic.  Psychiatric:  Alert, content, eating.   Nursing note and vitals reviewed.    ED Treatments / Results  Labs (all labs ordered are listed, but only abnormal results are displayed) Labs Reviewed - No data to display  EKG None  Radiology No results found.  Procedures Procedures (including critical care time)  Medications Ordered in ED Medications - No data to display   Initial Impression / Assessment and Plan / ED Course  I have reviewed the triage vital signs and the nursing notes.  Pertinent labs & imaging results that were available during my care of the patient were reviewed by me and considered in my medical decision making (see chart for details).  Patient states is hungry - was provided food and drink. Tolerating well.   Is alert, ambulates w steady  gait.  Patient currently appears stable for d/c.   Is encouraged to follow up with AA and use resource guide provided.     Final Clinical Impressions(s) / ED Diagnoses   Final diagnoses:  None    ED Discharge Orders    None       Cathren LaineSteinl, Chaundra Abreu, MD 08/19/17 2223

## 2017-08-19 NOTE — Discharge Instructions (Signed)
It was our pleasure to provide your ER care today - we hope that you feel better.  Do not drink alcohol - follow up with AA, and use resource guide provided for additional community resources.   For mental health issues and/or crisis[facility].  For primary care follow up - see referrals.   Return to ER if worse, new symptoms, other concern.

## 2017-08-20 ENCOUNTER — Other Ambulatory Visit: Payer: Self-pay

## 2017-08-20 ENCOUNTER — Emergency Department (HOSPITAL_COMMUNITY)
Admission: EM | Admit: 2017-08-20 | Discharge: 2017-08-20 | Discharge status: Absent without leave | Payer: Medicaid Other

## 2017-08-20 NOTE — ED Notes (Signed)
Pt called for triage, no response. 

## 2018-04-03 ENCOUNTER — Emergency Department (HOSPITAL_COMMUNITY): Payer: Medicaid Other

## 2018-04-03 ENCOUNTER — Emergency Department (HOSPITAL_COMMUNITY)
Admission: EM | Admit: 2018-04-03 | Discharge: 2018-04-03 | Disposition: A | Discharge status: Home / Self Care | Payer: Medicaid Other | Attending: Emergency Medicine | Admitting: Emergency Medicine

## 2018-04-03 DIAGNOSIS — Y9389 Activity, other specified: Secondary | ICD-10-CM | POA: Diagnosis not present

## 2018-04-03 DIAGNOSIS — Y999 Unspecified external cause status: Secondary | ICD-10-CM | POA: Insufficient documentation

## 2018-04-03 DIAGNOSIS — F17210 Nicotine dependence, cigarettes, uncomplicated: Secondary | ICD-10-CM | POA: Diagnosis not present

## 2018-04-03 DIAGNOSIS — Y929 Unspecified place or not applicable: Secondary | ICD-10-CM | POA: Insufficient documentation

## 2018-04-03 DIAGNOSIS — Z79899 Other long term (current) drug therapy: Secondary | ICD-10-CM | POA: Diagnosis not present

## 2018-04-03 DIAGNOSIS — S7012XA Contusion of left thigh, initial encounter: Secondary | ICD-10-CM | POA: Insufficient documentation

## 2018-04-03 DIAGNOSIS — I10 Essential (primary) hypertension: Secondary | ICD-10-CM | POA: Insufficient documentation

## 2018-04-03 MED ORDER — IBUPROFEN 400 MG PO TABS
600.0000 mg | ORAL_TABLET | Freq: Once | ORAL | Status: AC
  Administered 2018-04-03: 600 mg via ORAL
  Filled 2018-04-03: qty 1
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
## 2018-04-03 NOTE — ED Notes (Signed)
ED Provider at bedside. 

## 2018-04-03 NOTE — Discharge Instructions (Addendum)
Please return for any problem.  Follow-up with your regular care provider as instructed.  Use ibuprofen -600 mg taken orally every 8 hours - for control of your pain.

## 2018-04-03 NOTE — ED Notes (Signed)
Patient transported to X-ray 
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
## 2018-06-27 ENCOUNTER — Emergency Department (HOSPITAL_COMMUNITY)
Admission: EM | Admit: 2018-06-27 | Discharge: 2018-06-27 | Disposition: A | Discharge status: Home / Self Care | Payer: Medicaid Other | Attending: Emergency Medicine | Admitting: Emergency Medicine

## 2018-06-27 ENCOUNTER — Encounter (HOSPITAL_COMMUNITY): Payer: Self-pay | Admitting: Emergency Medicine

## 2018-06-27 ENCOUNTER — Other Ambulatory Visit: Payer: Self-pay

## 2018-06-27 DIAGNOSIS — Z590 Homelessness: Secondary | ICD-10-CM | POA: Diagnosis not present

## 2018-06-27 DIAGNOSIS — F250 Schizoaffective disorder, bipolar type: Secondary | ICD-10-CM | POA: Diagnosis not present

## 2018-06-27 DIAGNOSIS — M79652 Pain in left thigh: Secondary | ICD-10-CM | POA: Insufficient documentation

## 2018-06-27 DIAGNOSIS — G8929 Other chronic pain: Secondary | ICD-10-CM | POA: Diagnosis not present

## 2018-06-27 DIAGNOSIS — M549 Dorsalgia, unspecified: Secondary | ICD-10-CM | POA: Diagnosis not present

## 2018-06-27 DIAGNOSIS — R112 Nausea with vomiting, unspecified: Secondary | ICD-10-CM | POA: Diagnosis present

## 2018-06-27 DIAGNOSIS — I10 Essential (primary) hypertension: Secondary | ICD-10-CM | POA: Diagnosis not present

## 2018-06-27 DIAGNOSIS — Z79899 Other long term (current) drug therapy: Secondary | ICD-10-CM | POA: Diagnosis not present

## 2018-06-27 DIAGNOSIS — F17210 Nicotine dependence, cigarettes, uncomplicated: Secondary | ICD-10-CM | POA: Insufficient documentation

## 2018-06-27 LAB — CBG MONITORING, ED: Glucose-Capillary: 85 mg/dL (ref 70–99)

## 2018-06-27 MED ORDER — ACETAMINOPHEN 500 MG PO TABS: 500.0000 mg | ORAL_TABLET | Freq: Four times a day (QID) | ORAL | 0 refills | Status: DC | PRN

## 2018-06-27 MED ORDER — ONDANSETRON 4 MG PO TBDP: 4.0000 mg | ORAL_TABLET | Freq: Three times a day (TID) | ORAL | 0 refills | Status: AC | PRN

## 2018-06-27 MED ORDER — ACETAMINOPHEN 325 MG PO TABS
650.0000 mg | ORAL_TABLET | Freq: Once | ORAL | Status: AC
  Administered 2018-06-27: 650 mg via ORAL
  Filled 2018-06-27: qty 2

## 2018-06-27 MED ORDER — ONDANSETRON 4 MG PO TBDP
4.0000 mg | ORAL_TABLET | Freq: Once | ORAL | Status: AC
  Administered 2018-06-27: 05:00:00 4 mg via ORAL
  Filled 2018-06-27: qty 1

## 2018-06-27 NOTE — ED Notes (Signed)
Patient verbalizes understanding of discharge instructions. Opportunity for questioning and answers were provided. Armband removed by staff, pt discharged from ED ambulatory.   

## 2018-06-27 NOTE — ED Provider Notes (Signed)
[facility] EMERGENCY DEPARTMENT Provider Note   CSN: [id number] Arrival date & time: 06/27/18  0401    History   Chief Complaint Chief Complaint  Patient presents with  . Nausea    HPI Dustin Hansen is a 48 y.o. male.     HPI   Dustin Hansen is a 48 y.o. male, with a history of chronic back pain, bipolar, GERD, homelessness, HTN, presenting to the ED with nausea and vomiting beginning yesterday evening.  Endorses 4 episodes of nonbilious, nonbloody emesis. Also complains of left upper leg soreness for the past several months. Denies fever/chills, diarrhea, abdominal pain, chest pain, shortness of breath, cough, urinary symptoms, or any other acute complaints.     Past Medical History:  Diagnosis Date  . Anxiety   . Arthritis 11/08/2011   "feet and knees"  . Back pain, chronic   . Bipolar 1 disorder (HCC)   . Burning with urination 11/08/2011  . Chronic bronchitis (HCC) 11/08/2011   "get it q month"  . Daily headache 11/08/2011  . Depression   . GERD (gastroesophageal reflux disease)   . Hepatitis 11/08/2011   "not sure which one"  . History of stomach ulcers 11/08/2011  . Homelessness   . Hypertension   . Mental disorder 11/08/2011   "I get crazy; I take medicine"  . Myocardial infarction [facility])    "think so; when I was young; used to go to heart dr"  . Pneumonia   . Schizo-affective schizophrenia (HCC)   . Seizures (HCC) 11/08/2011   "jerking kind"  . Shortness of breath 11/08/2011   "lying down"  . Stroke (HCC) 11/08/2011   points to right chest  . Thoracic aortic aneurysm [facility]) 2012   4.4cm in October 2017    Patient Active Problem List   Diagnosis Date Noted  . Tobacco abuse 06/16/2017  . Poor dentition 06/16/2017  . Thoracic aortic aneurysm (HCC) 06/16/2017  . Bipolar disorder (HCC) 06/15/2017  . Seborrheic dermatitis 06/15/2017  . HTN (hypertension) 06/15/2017  . Major neurocognitive disorder (HCC) 06/15/2017  . Chronic pancreatitis (HCC)  11/14/2011  . Cannabis abuse 11/09/2011  . Hepatitis 11/08/2011  . Hyperglycemia 11/08/2011  . Alcohol abuse 11/08/2011  . Transaminasemia 11/08/2011  . Elevated lipase 11/08/2011  . Chronic back pain 02/07/2011  . Insomnia 02/07/2011    Past Surgical History:  Procedure Laterality Date  . CHOLECYSTECTOMY  2008  . EUS  11/14/2011   Procedure: FULL UPPER ENDOSCOPIC ULTRASOUND (EUS) RADIAL;  Surgeon: Willis Modena, MD;  [facility];  Service: Endoscopy;  Laterality: N/A;  to be [facility]linked from [facility]        Home Medications    Prior to Admission medications   Medication Sig Start Date End Date Taking? Authorizing Provider  acetaminophen (TYLENOL) 500 MG tablet Take 1 tablet (500 mg total) by mouth every 6 (six) hours as needed. 06/27/18   ,  C, PA-C  amLODipine (NORVASC) 5 MG tablet Take 1 tablet (5 mg total) by mouth daily. 06/15/17   Casey Burkitt, MD  divalproex (DEPAKOTE) 500 MG DR tablet Take 1,000 mg by mouth at bedtime.    [provider]  fluticasone (FLONASE) 50 MCG/ACT nasal spray Place into both nostrils daily.    [provider]  haloperidol decanoate (HALDOL DECANOATE) 50 MG/ML injection Inject 50 mg into the muscle every 28 (twenty-eight) days.    [provider]  ondansetron (ZOFRAN ODT) 4 MG disintegrating tablet Take 1 tablet (4 mg total)  by mouth every 8 (eight) hours as needed for nausea or vomiting. 06/27/18   , Hillard Danker, PA-C    Family History Family History  Problem Relation Age of Onset  . Hypertension Mother     Social History Social History   Tobacco Use  . Smoking status: Current Every Day Smoker    Packs/day: 0.50    Years: 29.00    Pack years: 14.50    Types: Cigarettes  . Smokeless tobacco: Never Used  Substance Use Topics  . Alcohol use: Yes    Comment: 11/08/2011 "4-5 bottles beer/wk"  . Drug use: Not Currently     Allergies   Patient has no known allergies.   Review of Systems  Review of Systems  Constitutional: Negative for chills, diaphoresis and fever.  Respiratory: Negative for cough and shortness of breath.   Cardiovascular: Negative for chest pain and leg swelling.  Gastrointestinal: Positive for nausea and vomiting. Negative for abdominal pain, blood in stool, constipation and diarrhea.  Genitourinary: Negative for dysuria, flank pain, frequency, hematuria, scrotal swelling and testicular pain.  Musculoskeletal: Negative for back pain.  Neurological: Negative for dizziness, syncope, weakness, light-headedness and headaches.  All other systems reviewed and are negative.    Physical Exam Updated Vital Signs BP (!) 145/104 (BP Location: Right Arm)   Pulse 62   Temp 98.4 F (36.9 C) (Oral)   Resp 16   Ht 5' (1.524 m)   Wt 81.6 kg   SpO2 100%   BMI 35.15 kg/m   Physical Exam Vitals signs and nursing note reviewed.  Constitutional:      General: He is not in acute distress.    Appearance: He is well-developed. He is not diaphoretic.  HENT:     Head: Normocephalic and atraumatic.     Mouth/Throat:     Mouth: Mucous membranes are moist.     Pharynx: Oropharynx is clear.  Eyes:     Conjunctiva/sclera: Conjunctivae normal.  Neck:     Musculoskeletal: Neck supple.  Cardiovascular:     Rate and Rhythm: Normal rate and regular rhythm.     Pulses: Normal pulses.          Posterior tibial pulses are 2+ on the right side and 2+ on the left side.     Heart sounds: Normal heart sounds.     Comments: Tactile temperature in the extremities appropriate and equal bilaterally. Pulmonary:     Effort: Pulmonary effort is normal. No respiratory distress.     Breath sounds: Normal breath sounds.  Abdominal:     Palpations: Abdomen is soft.     Tenderness: There is no abdominal tenderness. There is no guarding.  Musculoskeletal:     Right lower leg: No edema.     Left lower leg: No edema.     Comments: Patient endorses tenderness to the left anterior  thigh.  No swelling, color change, deformity, or instability noted. Full range of motion in the left hip and knee.  Lymphadenopathy:     Cervical: No cervical adenopathy.  Skin:    General: Skin is warm and dry.  Neurological:     Mental Status: He is alert.     Comments: Sensation grossly intact to light touch in the extremities.  Grip strengths equal bilaterally.  Strength 5/5 in all extremities. No gait disturbance. Coordination intact. Cranial nerves III-XII grossly intact. No facial droop.   Psychiatric:        Mood and Affect: Mood and affect normal.  Speech: Speech normal.        Behavior: Behavior normal.      ED Treatments / Results  Labs (all labs ordered are listed, but only abnormal results are displayed) Labs Reviewed  CBG MONITORING, ED    EKG None  Radiology No results found.  Procedures Procedures (including critical care time)  Medications Ordered in ED Medications  acetaminophen (TYLENOL) tablet 650 mg (650 mg Oral Given 06/27/18 0453)  ondansetron (ZOFRAN-ODT) disintegrating tablet 4 mg (4 mg Oral Given 06/27/18 0453)     Initial Impression / Assessment and Plan / ED Course  I have reviewed the triage vital signs and the nursing notes.  Pertinent labs & imaging results that were available during my care of the patient were reviewed by me and considered in my medical decision making (see chart for details).  Clinical Course as of Jun 27 718  Thu Jun 27, 2018  0501 Patient states he feels better.  Leg soreness has resolved. Tolerating PO.   [SJ]    Clinical Course User Index [SJ] ,  C, PA-C       Patient presents with complaint of nausea and vomiting.  No episodes of emesis here in the ED. Patient is nontoxic appearing, afebrile, not tachycardic, not tachypneic, not hypotensive, maintains excellent SPO2 on room air, and is in no apparent distress.  No evidence of neurovascular compromise in the patient's extremity. All symptoms  resolved with Zofran and Tylenol. The patient was given instructions for home care as well as return precautions. Patient voices understanding of these instructions, accepts the plan, and is comfortable with discharge.  Vitals:   06/27/18 0402 06/27/18 0405 06/27/18 0408 06/27/18 0415  BP:   (!) 145/104 (!) 128/44  Pulse:   62 62  Resp:   16 16  Temp:   98.4 F (36.9 C)   TempSrc:   Oral   SpO2: 97%  100% 98%  Weight:  81.6 kg    Height:  5' (1.524 m)       Final Clinical Impressions(s) / ED Diagnoses   Final diagnoses:  Non-intractable vomiting with nausea, unspecified vomiting type    ED Discharge Orders         Ordered    ondansetron (ZOFRAN ODT) 4 MG disintegrating tablet  Every 8 hours PRN     06/27/18 0502    acetaminophen (TYLENOL) 500 MG tablet  Every 6 hours PRN     06/27/18 0502           Anselm Pancoast,  C, PA-C 06/27/18 0728    Nira Connardama, Pedro Eduardo, MD 06/29/18 [phone]

## 2018-06-27 NOTE — ED Notes (Signed)
CBG Results of 85 reported to RN, Romeo Apple.

## 2018-06-27 NOTE — Discharge Instructions (Addendum)
Hydration: Drink plenty of fluids and get plenty of rest. You should be drinking at least half a liter of water an hour to stay hydrated. Electrolyte drinks (ex. Gatorade, Powerade, Pedialyte) are also encouraged. You should be drinking enough fluids to make your urine light yellow, almost clear. If this is not the case, you are not drinking enough water. Nausea/vomiting: Use the ondansetron (generic for Zofran) for nausea or vomiting.  This medication may not prevent all vomiting or nausea, but can help facilitate better hydration. Things that can help with nausea/vomiting also include peppermint/menthol candies, vitamin B12, and ginger. Acetaminophen: May take acetaminophen (generic for Tylenol), as needed, for pain. Your daily total maximum amount of acetaminophen from all sources should be limited to 4000mg /day for persons without liver problems, or 2000mg /day for those with liver problems. Follow-up: Follow-up with your primary care provider for any further management of this issue.

## 2018-06-27 NOTE — ED Notes (Signed)
Pt given Ginger Ale and Water for PO challenge. Pt tolerating fluids.

## 2018-06-27 NOTE — ED Triage Notes (Signed)
BIB [facility] from home with c/o of N/V starting tonight. Denies abdominal pain. Only reports chronic pain in legs.

## 2018-06-29 ENCOUNTER — Emergency Department (HOSPITAL_COMMUNITY)
Admission: EM | Admit: 2018-06-29 | Discharge: 2018-06-29 | Disposition: A | Discharge status: Home / Self Care | Payer: Medicaid Other | Attending: Emergency Medicine | Admitting: Emergency Medicine

## 2018-06-29 DIAGNOSIS — G8929 Other chronic pain: Secondary | ICD-10-CM | POA: Insufficient documentation

## 2018-06-29 DIAGNOSIS — F17210 Nicotine dependence, cigarettes, uncomplicated: Secondary | ICD-10-CM | POA: Insufficient documentation

## 2018-06-29 DIAGNOSIS — I10 Essential (primary) hypertension: Secondary | ICD-10-CM | POA: Insufficient documentation

## 2018-06-29 DIAGNOSIS — Z79899 Other long term (current) drug therapy: Secondary | ICD-10-CM | POA: Insufficient documentation

## 2018-06-29 DIAGNOSIS — I252 Old myocardial infarction: Secondary | ICD-10-CM | POA: Diagnosis not present

## 2018-06-29 DIAGNOSIS — M25552 Pain in left hip: Secondary | ICD-10-CM | POA: Diagnosis not present

## 2018-06-29 MED ORDER — ACETAMINOPHEN 325 MG PO TABS
650.0000 mg | ORAL_TABLET | Freq: Once | ORAL | Status: DC
  Filled 2018-06-29: qty 2

## 2018-06-29 NOTE — Discharge Instructions (Addendum)
Take Tylenol as needed for left hip pain. You can buy this in any store.   PLEASE find a primary care physician for routine medical concerns.

## 2018-06-29 NOTE — ED Provider Notes (Signed)
[facility]   CSN: [id number] Arrival date & time: 06/29/18  0002    History   Chief Complaint Chief Complaint  Patient presents with  . Pain    HPI Dustin Hansen is a 48 y.o. male.     Patient to ED with c/o left hip pain for the past 4 months. No inciting or recent injury. No abdominal or back pain. No swelling of the lower extremity. He denies any new symptoms tonight that are different than over the last 4 months.  The history is provided by the patient. No language interpreter was used.    Past Medical History:  Diagnosis Date  . Anxiety   . Arthritis 11/08/2011   "feet and knees"  . Back pain, chronic   . Bipolar 1 disorder (HCC)   . Burning with urination 11/08/2011  . Chronic bronchitis (HCC) 11/08/2011   "get it q month"  . Daily headache 11/08/2011  . Depression   . GERD (gastroesophageal reflux disease)   . Hepatitis 11/08/2011   "not sure which one"  . History of stomach ulcers 11/08/2011  . Homelessness   . Hypertension   . Mental disorder 11/08/2011   "I get crazy; I take medicine"  . Myocardial infarction [facility](HCC)    "think so; when I was young; used to go to heart dr"  . Pneumonia   . Schizo-affective schizophrenia (HCC)   . Seizures (HCC) 11/08/2011   "jerking kind"  . Shortness of breath 11/08/2011   "lying down"  . Stroke (HCC) 11/08/2011   points to right chest  . Thoracic aortic aneurysm [facility](HCC) 2012   4.4cm in October 2017    Patient Active Problem List   Diagnosis Date Noted  . Tobacco abuse 06/16/2017  . Poor dentition 06/16/2017  . Thoracic aortic aneurysm (HCC) 06/16/2017  . Bipolar disorder (HCC) 06/15/2017  . Seborrheic dermatitis 06/15/2017  . HTN (hypertension) 06/15/2017  . Major neurocognitive disorder (HCC) 06/15/2017  . Chronic pancreatitis (HCC) 11/14/2011  . Cannabis abuse 11/09/2011  . Hepatitis 11/08/2011  . Hyperglycemia 11/08/2011  . Alcohol abuse 11/08/2011  .  Transaminasemia 11/08/2011  . Elevated lipase 11/08/2011  . Chronic back pain 02/07/2011  . Insomnia 02/07/2011    Past Surgical History:  Procedure Laterality Date  . CHOLECYSTECTOMY  2008  . EUS  11/14/2011   Procedure: FULL UPPER ENDOSCOPIC ULTRASOUND (EUS) RADIAL;  Surgeon: Willis ModenaWilliam Outlaw, MD;  Location: [facility] ENDOSCOPY;  Service: Endoscopy;  Laterality: N/A;  [facility]        Home Medications    Prior to Admission medications   Medication Sig Start Date End Date Taking? Authorizing Provider  acetaminophen (TYLENOL) 500 MG tablet Take 1 tablet (500 mg total) by mouth every 6 (six) hours as needed. 06/27/18   Joy, Shawn C, PA-C  amLODipine (NORVASC) 5 MG tablet Take 1 tablet (5 mg total) by mouth daily. 06/15/17   Casey BurkittFitzgerald, Hillary Moen, MD  divalproex (DEPAKOTE) 500 MG DR tablet Take 1,000 mg by mouth at bedtime.    [provider]  fluticasone (FLONASE) 50 MCG/ACT nasal spray Place into both nostrils daily.    [provider]  haloperidol decanoate (HALDOL DECANOATE) 50 MG/ML injection Inject 50 mg into the muscle every 28 (twenty-eight) days.    [provider]  ondansetron (ZOFRAN ODT) 4 MG disintegrating tablet Take 1 tablet (4 mg total) by mouth every 8 (eight) hours as needed for nausea or vomiting. 06/27/18  Anselm Pancoast, PA-C    Family History Family History  Problem Relation Age of Onset  . Hypertension Mother     Social History Social History   Tobacco Use  . Smoking status: Current Every Day Smoker    Packs/day: 0.50    Years: 29.00    Pack years: 14.50    Types: Cigarettes  . Smokeless tobacco: Never Used  Substance Use Topics  . Alcohol use: Yes    Comment: 11/08/2011 "4-5 bottles beer/wk"  . Drug use: Not Currently     Allergies   Patient has no known allergies.   Review of Systems Review of Systems  Constitutional: Negative for fever.  Gastrointestinal: Negative for abdominal pain.  Musculoskeletal:  Negative for back pain.       Left hip pain x 4 months  Skin: Negative for color change.  Neurological: Negative for numbness.     Physical Exam Updated Vital Signs BP (!) 146/93 (BP Location: Right Arm)   Pulse 68   Temp (!) 97.4 F (36.3 C) (Oral)   Resp 18   Ht 5\' 5"  (1.651 m)   Wt 81.6 kg   BMI 29.95 kg/m   Physical Exam Constitutional:      Appearance: He is well-developed.  Neck:     Musculoskeletal: Normal range of motion.  Pulmonary:     Effort: Pulmonary effort is normal.  Musculoskeletal: Normal range of motion.     Comments: No lower back tenderness or swelling. Left hip is unremarkable in appearance. No swelling or redness. Tender over lateral joint. Distal pulses intact.   Skin:    General: Skin is warm and dry.  Neurological:     Mental Status: He is alert and oriented to person, place, and time.     Sensory: No sensory deficit.      ED Treatments / Results  Labs (all labs ordered are listed, but only abnormal results are displayed) Labs Reviewed - No data to display  EKG None  Radiology No results found.  Procedures Procedures (including critical care time)  Medications Ordered in ED Medications  acetaminophen (TYLENOL) tablet 650 mg (has no administration in time range)     Initial Impression / Assessment and Plan / ED Course  I have reviewed the triage vital signs and the nursing notes.  Pertinent labs & imaging results that were available during my care of the patient were reviewed by me and considered in my medical decision making (see chart for details).        Patient to ED with left hip pain unchanged over the last 4 months. No new symptoms. Exam is unremarkable except for subjective tenderness. Tylenol provided. Stable for discharge home.   Final Clinical Impressions(s) / ED Diagnoses   Final diagnoses:  None   1. Chronic left hip pain  ED Discharge Orders    None       Danne Harbor 06/29/18 0218     Dione Booze, MD 06/29/18 [phone]

## 2018-06-29 NOTE — ED Triage Notes (Signed)
B[facility] streets...Marland KitchenMarland Kitchen Pt reports pain everywhere. Ambulatory.

## 2018-06-29 NOTE — ED Notes (Signed)
Pt left without pain medication or paperwork

## 2018-06-29 NOTE — ED Notes (Signed)
Pt states that is here for pain in his L leg that keeps him up at night for the past four months

## 2018-07-11 ENCOUNTER — Other Ambulatory Visit: Payer: Self-pay

## 2018-07-11 ENCOUNTER — Emergency Department (HOSPITAL_COMMUNITY)
Admission: EM | Admit: 2018-07-11 | Discharge: 2018-07-11 | Disposition: A | Discharge status: Home / Self Care | Payer: Medicaid Other | Attending: Emergency Medicine | Admitting: Emergency Medicine

## 2018-07-11 DIAGNOSIS — R51 Headache: Secondary | ICD-10-CM | POA: Diagnosis not present

## 2018-07-11 DIAGNOSIS — R519 Headache, unspecified: Secondary | ICD-10-CM

## 2018-07-11 MED ORDER — ACETAMINOPHEN 325 MG PO TABS: 650.0000 mg | ORAL_TABLET | Freq: Four times a day (QID) | ORAL | 0 refills | Status: AC | PRN

## 2018-07-11 MED ORDER — ACETAMINOPHEN 325 MG PO TABS
650.0000 mg | ORAL_TABLET | Freq: Once | ORAL | Status: AC
  Administered 2018-07-11: 650 mg via ORAL
  Filled 2018-07-11: qty 2

## 2018-07-11 NOTE — ED Provider Notes (Signed)
[facility] EMERGENCY DEPARTMENT Provider Note   CSN: [id number] Arrival date & time: 07/11/18  0846    History   Chief Complaint Chief Complaint  Patient presents with  . Headache    HPI Dustin Hansen is a 48 y.o. male.     HPI   48 year old male presents today with complaints of headache.  Patient notes slow onset headache last night, generalized, nonfocal with no associated neurological deficits.  He notes 2 days ago he did fall, no loss of consciousness.  He denies any neck stiffness or fever, denies any distal neurological deficits.  He has not tried any medication for the pain.  Patient has a history of the same.  Denies any alcohol use today.  Past Medical History:  Diagnosis Date  . Anxiety   . Arthritis 11/08/2011   "feet and knees"  . Back pain, chronic   . Bipolar 1 disorder (HCC)   . Burning with urination 11/08/2011  . Chronic bronchitis (HCC) 11/08/2011   "get it q month"  . Daily headache 11/08/2011  . Depression   . GERD (gastroesophageal reflux disease)   . Hepatitis 11/08/2011   "not sure which one"  . History of stomach ulcers 11/08/2011  . Homelessness   . Hypertension   . Mental disorder 11/08/2011   "I get crazy; I take medicine"  . Myocardial infarction [facility])    "think so; when I was young; used to go to heart dr"  . Pneumonia   . Schizo-affective schizophrenia (HCC)   . Seizures (HCC) 11/08/2011   "jerking kind"  . Shortness of breath 11/08/2011   "lying down"  . Stroke (HCC) 11/08/2011   points to right chest  . Thoracic aortic aneurysm [facility]) 2012   4.4cm in October 2017    Patient Active Problem List   Diagnosis Date Noted  . Tobacco abuse 06/16/2017  . Poor dentition 06/16/2017  . Thoracic aortic aneurysm (HCC) 06/16/2017  . Bipolar disorder (HCC) 06/15/2017  . Seborrheic dermatitis 06/15/2017  . HTN (hypertension) 06/15/2017  . Major neurocognitive disorder (HCC) 06/15/2017  . Chronic pancreatitis (HCC) 11/14/2011  .  Cannabis abuse 11/09/2011  . Hepatitis 11/08/2011  . Hyperglycemia 11/08/2011  . Alcohol abuse 11/08/2011  . Transaminasemia 11/08/2011  . Elevated lipase 11/08/2011  . Chronic back pain 02/07/2011  . Insomnia 02/07/2011    Past Surgical History:  Procedure Laterality Date  . CHOLECYSTECTOMY  2008  . EUS  11/14/2011   Procedure: FULL UPPER ENDOSCOPIC ULTRASOUND (EUS) RADIAL;  Surgeon: Willis Modena, MD;  Location[facility];  Service: Endoscopy;  Laterality: N/A;  to be carelinked from [facility]        Home Medications    Prior to Admission medications   Medication Sig Start Date End Date Taking? Authorizing Provider  acetaminophen (TYLENOL) 325 MG tablet Take 2 tablets (650 mg total) by mouth every 6 (six) hours as needed. 07/11/18   Rivaan Kendall, Tinnie Gens, PA-C  amLODipine (NORVASC) 5 MG tablet Take 1 tablet (5 mg total) by mouth daily. 06/15/17   Casey Burkitt, MD  divalproex (DEPAKOTE) 500 MG DR tablet Take 1,000 mg by mouth at bedtime.    [provider]  fluticasone (FLONASE) 50 MCG/ACT nasal spray Place into both nostrils daily.    [provider]  haloperidol decanoate (HALDOL DECANOATE) 50 MG/ML injection Inject 50 mg into the muscle every 28 (twenty-eight) days.    [provider]  ondansetron (ZOFRAN ODT) 4 MG disintegrating tablet Take 1 tablet (4  mg total) by mouth every 8 (eight) hours as needed for nausea or vomiting. 06/27/18   Joy, Hillard Danker, PA-C    Family History Family History  Problem Relation Age of Onset  . Hypertension Mother     Social History Social History   Tobacco Use  . Smoking status: Current Every Day Smoker    Packs/day: 0.50    Years: 29.00    Pack years: 14.50    Types: Cigarettes  . Smokeless tobacco: Never Used  Substance Use Topics  . Alcohol use: Yes    Comment: 11/08/2011 "4-5 bottles beer/wk"  . Drug use: Not Currently     Allergies   Patient has no known allergies.   Review of Systems Review of  Systems  All other systems reviewed and are negative.    Physical Exam Updated Vital Signs BP (!) 155/100   Pulse 71   Temp 98.3 F (36.8 C) (Oral)   Resp 17   SpO2 99%   Physical Exam Vitals signs and nursing note reviewed.  Constitutional:      Appearance: He is well-developed.  HENT:     Head: Normocephalic and atraumatic.     Comments: Head is atraumatic Eyes:     General: No visual field deficit or scleral icterus.       Right eye: No discharge.        Left eye: No discharge.     Conjunctiva/sclera: Conjunctivae normal.     Pupils: Pupils are equal, round, and reactive to light.  Neck:     Musculoskeletal: Normal range of motion.     Vascular: No JVD.     Trachea: No tracheal deviation.  Pulmonary:     Effort: Pulmonary effort is normal.     Breath sounds: No stridor.  Musculoskeletal:     Comments: No C or T-spine tenderness palpation neck supple full active range of motion  Neurological:     Mental Status: He is alert and oriented to person, place, and time. Mental status is at baseline.     GCS: GCS eye subscore is 4. GCS verbal subscore is 5. GCS motor subscore is 6.     Cranial Nerves: No cranial nerve deficit, dysarthria or facial asymmetry.     Sensory: No sensory deficit.     Motor: No weakness.     Coordination: Coordination normal.  Psychiatric:        Behavior: Behavior normal.        Thought Content: Thought content normal.        Judgment: Judgment normal.      ED Treatments / Results  Labs (all labs ordered are listed, but only abnormal results are displayed) Labs Reviewed - No data to display  EKG None  Radiology No results found.  Procedures Procedures (including critical care time)  Medications Ordered in ED Medications  acetaminophen (TYLENOL) tablet 650 mg (has no administration in time range)     Initial Impression / Assessment and Plan / ED Course  I have reviewed the triage vital signs and the nursing notes.   Pertinent labs & imaging results that were available during my care of the patient were reviewed by me and considered in my medical decision making (see chart for details).        48 year old male presents today with headache.  He has no signs of acute intracranial abnormality.  Is well-appearing in no acute distress.  Patient will given Tylenol discharged with Tylenol prescription encouraged use either Tylenol or  Profen as needed for discomfort.  Was given strict return precautions.  Verbalized understanding and agreement to today's plan.  Final Clinical Impressions(s) / ED Diagnoses   Final diagnoses:  Acute nonintractable headache, unspecified headache type    ED Discharge Orders         Ordered    acetaminophen (TYLENOL) 325 MG tablet  Every 6 hours PRN     07/11/18 0859           Eyvonne MechanicHedges, Ladena Jacquez, PA-C 07/11/18 0901    Shaune PollackIsaacs, Cameron, MD 07/12/18 [phone]

## 2018-07-11 NOTE — ED Notes (Signed)
Patient verbalizes understanding of discharge instructions. Opportunity for questioning and answering were provided.  patient discharged from ED.  

## 2018-07-11 NOTE — ED Triage Notes (Signed)
Pt c/o headache that began last night ; neuro intact; hasnt taken anything for the pain

## 2018-07-11 NOTE — Discharge Instructions (Addendum)
Please read attached information. If you experience any new or worsening signs or symptoms please return to the emergency room for evaluation. Please follow-up with your primary care provider or specialist as discussed. Please use medication prescribed only as directed and discontinue taking if you have any concerning signs or symptoms.   °

## 2018-09-21 ENCOUNTER — Emergency Department (HOSPITAL_COMMUNITY)
Admission: EM | Admit: 2018-09-21 | Discharge: 2018-09-21 | Disposition: A | Discharge status: Home / Self Care | Payer: Medicaid Other | Attending: Emergency Medicine | Admitting: Emergency Medicine

## 2018-09-21 ENCOUNTER — Other Ambulatory Visit: Payer: Self-pay

## 2018-09-21 DIAGNOSIS — R112 Nausea with vomiting, unspecified: Secondary | ICD-10-CM | POA: Diagnosis not present

## 2018-09-21 DIAGNOSIS — Z5321 Procedure and treatment not carried out due to patient leaving prior to being seen by health care provider: Secondary | ICD-10-CM | POA: Diagnosis not present

## 2018-09-21 NOTE — ED Triage Notes (Signed)
Has not taken his medication for 20 years. No presents of N/V

## 2018-09-21 NOTE — ED Triage Notes (Signed)
EMS stated, he has had N/V for the last hour.

## 2018-09-21 NOTE — ED Notes (Addendum)
Called pt in lobby , no response. Lobby staff states pt was seen walking to the parking lot.

## 2018-11-07 ENCOUNTER — Emergency Department (HOSPITAL_COMMUNITY)
Admission: EM | Admit: 2018-11-07 | Discharge: 2018-11-07 | Disposition: A | Discharge status: Home / Self Care | Payer: Medicaid Other | Attending: Emergency Medicine | Admitting: Emergency Medicine

## 2018-11-07 ENCOUNTER — Other Ambulatory Visit: Payer: Self-pay

## 2018-11-07 DIAGNOSIS — H5713 Ocular pain, bilateral: Secondary | ICD-10-CM | POA: Diagnosis present

## 2018-11-07 DIAGNOSIS — Z590 Homelessness: Secondary | ICD-10-CM | POA: Insufficient documentation

## 2018-11-07 DIAGNOSIS — F17210 Nicotine dependence, cigarettes, uncomplicated: Secondary | ICD-10-CM | POA: Insufficient documentation

## 2018-11-07 DIAGNOSIS — B309 Viral conjunctivitis, unspecified: Secondary | ICD-10-CM | POA: Diagnosis not present

## 2018-11-07 DIAGNOSIS — Z79899 Other long term (current) drug therapy: Secondary | ICD-10-CM | POA: Insufficient documentation

## 2018-11-07 DIAGNOSIS — F250 Schizoaffective disorder, bipolar type: Secondary | ICD-10-CM | POA: Insufficient documentation

## 2018-11-07 MED ORDER — ERYTHROMYCIN 5 MG/GM OP OINT: TOPICAL_OINTMENT | OPHTHALMIC | 0 refills | Status: AC

## 2018-11-07 MED ORDER — TETRACAINE HCL 0.5 % OP SOLN
2.0000 [drp] | Freq: Once | OPHTHALMIC | Status: AC
  Administered 2018-11-07: 18:00:00 2 [drp] via OPHTHALMIC
  Filled 2018-11-07: qty 4

## 2018-11-07 MED ORDER — NAPHAZOLINE-PHENIRAMINE 0.025-0.3 % OP SOLN: 1.0000 [drp] | Freq: Four times a day (QID) | OPHTHALMIC | 0 refills | Status: AC | PRN

## 2018-11-07 MED ORDER — FLUORESCEIN SODIUM 1 MG OP STRP
1.0000 | ORAL_STRIP | Freq: Once | OPHTHALMIC | Status: AC
  Administered 2018-11-07: 18:00:00 1 via OPHTHALMIC
  Filled 2018-11-07: qty 1

## 2018-11-07 NOTE — ED Notes (Signed)
Pt approached RN and states he went to smoke. RN had his discharge reversed and pt roomed

## 2018-11-07 NOTE — ED Provider Notes (Signed)
[facility]   CSN: [id number] Arrival date & time: 11/07/18  1548     History   Chief Complaint Chief Complaint  Patient presents with  . Eye Pain    HPI Dustin Hansen is a 48 y.o. male.     HPI  Patient is a 48 year old male with a history of bipolar 1, GERD, homelessness, hypertension, mental disorder, MI, schizoaffective schizophrenia, seizures, CVA, who presents to the emergency department today for evaluation of bilateral eye irritation.  Patient states that he was mowing the lawn a few days ago and feels like he had grass cut in his eyes.  Since then he has had bilateral eye itchiness and irritation.  States that he is itching it so much that it has caused pain to his eyes.  States that his vision is somewhat different.  He denies any other associated symptoms.  He does not wear contacts.  Past Medical History:  Diagnosis Date  . Anxiety   . Arthritis 11/08/2011   "feet and knees"  . Back pain, chronic   . Bipolar 1 disorder (HCC)   . Burning with urination 11/08/2011  . Chronic bronchitis (HCC) 11/08/2011   "get it q month"  . Daily headache 11/08/2011  . Depression   . GERD (gastroesophageal reflux disease)   . Hepatitis 11/08/2011   "not sure which one"  . History of stomach ulcers 11/08/2011  . Homelessness   . Hypertension   . Mental disorder 11/08/2011   "I get crazy; I take medicine"  . Myocardial infarction [facility](HCC)    "think so; when I was young; used to go to heart dr"  . Pneumonia   . Schizo-affective schizophrenia (HCC)   . Seizures (HCC) 11/08/2011   "jerking kind"  . Shortness of breath 11/08/2011   "lying down"  . Stroke (HCC) 11/08/2011   points to right chest  . Thoracic aortic aneurysm [facility](HCC) 2012   4.4cm in October 2017    Patient Active Problem List   Diagnosis Date Noted  . Tobacco abuse 06/16/2017  . Poor dentition 06/16/2017  . Thoracic aortic aneurysm (HCC) 06/16/2017  . Bipolar disorder  (HCC) 06/15/2017  . Seborrheic dermatitis 06/15/2017  . HTN (hypertension) 06/15/2017  . Major neurocognitive disorder (HCC) 06/15/2017  . Chronic pancreatitis (HCC) 11/14/2011  . Cannabis abuse 11/09/2011  . Hepatitis 11/08/2011  . Hyperglycemia 11/08/2011  . Alcohol abuse 11/08/2011  . Transaminasemia 11/08/2011  . Elevated lipase 11/08/2011  . Chronic back pain 02/07/2011  . Insomnia 02/07/2011    Past Surgical History:  Procedure Laterality Date  . CHOLECYSTECTOMY  2008  . EUS  11/14/2011   Procedure: FULL UPPER ENDOSCOPIC ULTRASOUND (EUS) RADIAL;  Surgeon: Willis ModenaWilliam Outlaw, MD;  Location: [facility];  Service: Endoscopy;  Laterality: N/A;  [facility]        Home Medications    Prior to Admission medications   Medication Sig Start Date End Date Taking? Authorizing Provider  acetaminophen (TYLENOL) 325 MG tablet Take 2 tablets (650 mg total) by mouth every 6 (six) hours as needed. 07/11/18   Hedges, Tinnie GensJeffrey, PA-C  amLODipine (NORVASC) 5 MG tablet Take 1 tablet (5 mg total) by mouth daily. 06/15/17   Casey BurkittFitzgerald, Hillary Moen, MD  divalproex (DEPAKOTE) 500 MG DR tablet Take 1,000 mg by mouth at bedtime.    [provider]  erythromycin ophthalmic ointment Place a 1/2 inch ribbon of ointment into the lower eyelid 4 times daily for 7  days. 11/07/18   Cici Rodriges S, PA-C  fluticasone (FLONASE) 50 MCG/ACT nasal spray Place into both nostrils daily.    [provider]  haloperidol decanoate (HALDOL DECANOATE) 50 MG/ML injection Inject 50 mg into the muscle every 28 (twenty-eight) days.    [provider]  naphazoline-pheniramine (NAPHCON-A) 0.025-0.3 % ophthalmic solution Place 1 drop into both eyes every 6 (six) hours as needed for up to 3 days for eye irritation. 11/07/18 11/10/18  Javontay Vandam S, PA-C  ondansetron (ZOFRAN ODT) 4 MG disintegrating tablet Take 1 tablet (4 mg total) by mouth every 8 (eight) hours as needed for nausea or  vomiting. 06/27/18   Joy, Hillard DankerShawn C, PA-C    Family History Family History  Problem Relation Age of Onset  . Hypertension Mother     Social History Social History   Tobacco Use  . Smoking status: Current Every Day Smoker    Packs/day: 0.50    Years: 29.00    Pack years: 14.50    Types: Cigarettes  . Smokeless tobacco: Never Used  Substance Use Topics  . Alcohol use: Yes    Comment: 11/08/2011 "4-5 bottles beer/wk"  . Drug use: Not Currently     Allergies   Patient has no known allergies.   Review of Systems Review of Systems  Constitutional: Negative for fever.  HENT: Negative for congestion, ear pain, rhinorrhea and sore throat.   Eyes: Positive for pain, itching and visual disturbance.  Musculoskeletal: Negative for back pain.  Skin: Negative for rash.  Neurological: Negative for headaches.  All other systems reviewed and are negative.    Physical Exam Updated Vital Signs BP (!) 133/94 (BP Location: Right Arm)   Pulse 83   Temp 98.1 F (36.7 C) (Oral)   Resp 16   SpO2 96%   Physical Exam Constitutional:      General: He is not in acute distress.    Appearance: He is well-developed.  Eyes:     Extraocular Movements: Extraocular movements intact.     Conjunctiva/sclera: Conjunctivae normal.     Pupils: Pupils are equal, round, and reactive to light.     Comments: No periorbital edema or erythema.  Fluorescein stain completed without evidence of uptake bilaterally.  IOP are normal bilaterally.  R: 20 mmHg, L: 16 mmHg.  Cardiovascular:     Rate and Rhythm: Normal rate.  Pulmonary:     Effort: Pulmonary effort is normal.  Skin:    General: Skin is warm and dry.  Neurological:     Mental Status: He is alert and oriented to person, place, and time.     ED Treatments / Results  Labs (all labs ordered are listed, but only abnormal results are displayed) Labs Reviewed - No data to display  EKG None  Radiology No results found.  Procedures  Procedures (including critical care time)  Medications Ordered in ED Medications  tetracaine (PONTOCAINE) 0.5 % ophthalmic solution 2 drop (2 drops Right Eye Given 11/07/18 1816)  fluorescein ophthalmic strip 1 strip (1 strip Right Eye Given 11/07/18 1816)     Initial Impression / Assessment and Plan / ED Course  I have reviewed the triage vital signs and the nursing notes.  Pertinent labs & imaging results that were available during my care of the patient were reviewed by me and considered in my medical decision making (see chart for details).    Final Clinical Impressions(s) / ED Diagnoses   Final diagnoses:  Viral conjunctivitis   48 year old  male presenting with eye itching and irritation after mowing the lawn a few days ago. No periorbital edema or erythema.  Fluorescein stain completed without evidence of uptake bilaterally.  IOP are normal bilaterally.  R: 20 mmHg, L: 16 mmHg.  Suspect viral conjunctivitis.  I do not see any evidence of corneal abrasion on exam however will give him erythromycin ointment to prevent infection given he continually is itching his eyes.  We will also give treatment for allergic conjunctivitis.  Advised to return if worse.  ED Discharge Orders         Ordered    erythromycin ophthalmic ointment     11/07/18 1856    naphazoline-pheniramine (NAPHCON-A) 0.025-0.3 % ophthalmic solution  Every 6 hours PRN     11/07/18 1856           Rodney Booze, PA-C 11/07/18 1858    Blanchie Dessert, MD 11/09/18 2043

## 2018-11-07 NOTE — ED Triage Notes (Signed)
Patient arrives via [facility] from cookout parking lot c/o R eye pain since yesterday - states he was mowing grass and feels like he got something in it. EMS VS: 146/90, P 84, RR 16, 96% RA, CBG 120. NAD noted.

## 2018-11-07 NOTE — ED Notes (Signed)
Patient Alert and oriented to baseline. Stable and ambulatory to baseline. Patient verbalized understanding of the discharge instructions.  Patient belongings were taken by the patient.   

## 2018-11-07 NOTE — ED Notes (Signed)
Pt left immediately after being triaged. Pt did not speak with nursing staff about leaving. Walked outside and did not return.
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
## 2020-05-25 ENCOUNTER — Encounter (HOSPITAL_COMMUNITY): Payer: Self-pay

## 2020-05-25 ENCOUNTER — Other Ambulatory Visit: Payer: Self-pay

## 2020-05-25 ENCOUNTER — Emergency Department (HOSPITAL_COMMUNITY)
Admission: EM | Admit: 2020-05-25 | Discharge: 2020-05-25 | Disposition: A | Discharge status: Home / Self Care | Payer: Medicaid Other | Attending: Emergency Medicine | Admitting: Emergency Medicine

## 2020-05-25 DIAGNOSIS — Z5321 Procedure and treatment not carried out due to patient leaving prior to being seen by health care provider: Secondary | ICD-10-CM | POA: Diagnosis present
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
## 2020-05-25 NOTE — ED Notes (Signed)
Called pt to go back to room, no answer pt not in waiting area or triage area.

## 2020-07-20 ENCOUNTER — Emergency Department (HOSPITAL_COMMUNITY)
Admission: EM | Admit: 2020-07-20 | Discharge: 2020-07-20 | Discharge status: Against Medical Advice | Payer: Medicaid Other | Attending: Emergency Medicine | Admitting: Emergency Medicine

## 2020-07-20 DIAGNOSIS — R519 Headache, unspecified: Secondary | ICD-10-CM | POA: Insufficient documentation

## 2020-07-20 DIAGNOSIS — Z5321 Procedure and treatment not carried out due to patient leaving prior to being seen by health care provider: Secondary | ICD-10-CM | POA: Diagnosis not present

## 2020-07-20 NOTE — ED Notes (Signed)
Pt called a ride and left
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
## 2020-08-06 ENCOUNTER — Emergency Department (HOSPITAL_COMMUNITY)
Admission: EM | Admit: 2020-08-06 | Discharge: 2020-08-06 | Disposition: A | Discharge status: Home / Self Care | Payer: Medicaid Other | Attending: Emergency Medicine | Admitting: Emergency Medicine

## 2020-08-06 ENCOUNTER — Other Ambulatory Visit: Payer: Self-pay

## 2020-08-06 ENCOUNTER — Emergency Department (HOSPITAL_COMMUNITY): Payer: Medicaid Other

## 2020-08-06 DIAGNOSIS — S82845A Nondisplaced bimalleolar fracture of left lower leg, initial encounter for closed fracture: Secondary | ICD-10-CM | POA: Diagnosis not present

## 2020-08-06 DIAGNOSIS — I10 Essential (primary) hypertension: Secondary | ICD-10-CM | POA: Diagnosis not present

## 2020-08-06 DIAGNOSIS — S82842A Displaced bimalleolar fracture of left lower leg, initial encounter for closed fracture: Secondary | ICD-10-CM

## 2020-08-06 DIAGNOSIS — M79672 Pain in left foot: Secondary | ICD-10-CM | POA: Insufficient documentation

## 2020-08-06 DIAGNOSIS — Z79899 Other long term (current) drug therapy: Secondary | ICD-10-CM | POA: Diagnosis not present

## 2020-08-06 DIAGNOSIS — F17210 Nicotine dependence, cigarettes, uncomplicated: Secondary | ICD-10-CM | POA: Diagnosis not present

## 2020-08-06 DIAGNOSIS — Y92481 Parking lot as the place of occurrence of the external cause: Secondary | ICD-10-CM | POA: Insufficient documentation

## 2020-08-06 DIAGNOSIS — M79673 Pain in unspecified foot: Secondary | ICD-10-CM

## 2020-08-06 DIAGNOSIS — S99912A Unspecified injury of left ankle, initial encounter: Secondary | ICD-10-CM | POA: Diagnosis present

## 2020-08-06 MED ORDER — HYDROCODONE-ACETAMINOPHEN 5-325 MG PO TABS: 1.0000 | ORAL_TABLET | Freq: Once | ORAL | Status: DC

## 2020-08-06 MED ORDER — FENTANYL CITRATE (PF) 100 MCG/2ML IJ SOLN
50.0000 ug | Freq: Once | INTRAMUSCULAR | Status: AC
  Administered 2020-08-06: 50 ug via INTRAVENOUS
  Filled 2020-08-06: qty 2

## 2020-08-06 MED ORDER — HYDROCODONE-ACETAMINOPHEN 5-325 MG PO TABS
1.0000 | ORAL_TABLET | Freq: Once | ORAL | Status: AC
  Administered 2020-08-06: 1 via ORAL
  Filled 2020-08-06: qty 1

## 2020-08-06 MED ORDER — FENTANYL CITRATE (PF) 100 MCG/2ML IJ SOLN: 50.0000 ug | Freq: Once | INTRAMUSCULAR | Status: DC

## 2020-08-06 MED ORDER — HYDROCODONE-ACETAMINOPHEN 5-325 MG PO TABS: 1.0000 | ORAL_TABLET | Freq: Four times a day (QID) | ORAL | 0 refills | Status: DC | PRN

## 2020-08-06 NOTE — Progress Notes (Signed)
Orthopedic Tech Progress Note Patient Details:  Dustin Hansen 18-Mar-1971 [id number]  Ortho Devices Type of Ortho Device: Post (short leg) splint Ortho Device/Splint Location: LLE Ortho Device/Splint Interventions: Ordered,Application   Post Interventions Patient Tolerated: Well Instructions Provided: Care of device,Adjustment of device   Saskia Simerson 08/06/2020, 10:45 AM

## 2020-08-06 NOTE — Discharge Instructions (Addendum)
You have a fracture of your ankle on both sides.  You were placed in a splint today.  You should not place any weight on this foot, you will be nonweightbearing.  Please elevate your foot as often as possible and ice your foot 20 minutes at a time several times a day.  Recommend using Tylenol 500mg  every 6-8 hours (max 4000mg  daily) and can also use ibuprofen 200-400 mg every 6 hours.  We will also send in a stronger medication to help with severe pain, do not use this medication when you are operating any machinery.  Do not drive if you are taking this medication.  You have schedule follow-up with Dr. , orthopedics, on Tuesday at 10 AM.  Please do not miss this appointment.  It is very important that you follow-up with them as you will need surgery on your ankle for this to heal appropriately.
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
## 2020-08-06 NOTE — ED Notes (Signed)
Pt verbalized understanding of d/c instructions and follow up care. Pt dressed, IV removed. Pt wheeled out with crutches and put into [place].

## 2020-08-13 ENCOUNTER — Emergency Department (HOSPITAL_COMMUNITY)
Admission: EM | Admit: 2020-08-13 | Discharge: 2020-08-13 | Disposition: A | Discharge status: Home / Self Care | Payer: Medicaid Other | Attending: Emergency Medicine | Admitting: Emergency Medicine

## 2020-08-13 ENCOUNTER — Other Ambulatory Visit: Payer: Self-pay

## 2020-08-13 ENCOUNTER — Encounter (HOSPITAL_COMMUNITY): Payer: Self-pay | Admitting: Emergency Medicine

## 2020-08-13 DIAGNOSIS — S82842D Displaced bimalleolar fracture of left lower leg, subsequent encounter for closed fracture with routine healing: Secondary | ICD-10-CM | POA: Insufficient documentation

## 2020-08-13 DIAGNOSIS — M25572 Pain in left ankle and joints of left foot: Secondary | ICD-10-CM

## 2020-08-13 DIAGNOSIS — X58XXXD Exposure to other specified factors, subsequent encounter: Secondary | ICD-10-CM | POA: Diagnosis not present

## 2020-08-13 DIAGNOSIS — F17210 Nicotine dependence, cigarettes, uncomplicated: Secondary | ICD-10-CM | POA: Insufficient documentation

## 2020-08-13 DIAGNOSIS — I10 Essential (primary) hypertension: Secondary | ICD-10-CM | POA: Diagnosis not present

## 2020-08-13 DIAGNOSIS — S8992XD Unspecified injury of left lower leg, subsequent encounter: Secondary | ICD-10-CM | POA: Diagnosis present

## 2020-08-13 MED ORDER — OXYCODONE-ACETAMINOPHEN 5-325 MG PO TABS
2.0000 | ORAL_TABLET | Freq: Once | ORAL | Status: AC
  Administered 2020-08-13: 2 via ORAL
  Filled 2020-08-13: qty 2

## 2020-08-13 MED ORDER — OXYCODONE-ACETAMINOPHEN 5-325 MG PO TABS: 2.0000 | ORAL_TABLET | ORAL | 0 refills | Status: DC | PRN
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
## 2020-08-13 NOTE — ED Provider Notes (Signed)
[facility] EMERGENCY DEPARTMENT Provider Note   CSN: [id number] Arrival date & time: 08/13/20  0436     History Chief Complaint  Patient presents with  . Ankle Pain    Dustin Hansen is a 50 y.o. male.  Patient with a bimalleolar fracture about a week ago.  Splint placed.  Patient was discharged.  Patient comes back in because his pain was severe today so he took the splint off.  He states that he has scheduled for an appoint with orthopedics on June 14 but the pain was too much.  No other new injuries.   Ankle Pain      Past Medical History:  Diagnosis Date  . Anxiety   . Arthritis 11/08/2011   "feet and knees"  . Back pain, chronic   . Bipolar 1 disorder (HCC)   . Burning with urination 11/08/2011  . Chronic bronchitis (HCC) 11/08/2011   "get it q month"  . Daily headache 11/08/2011  . Depression   . GERD (gastroesophageal reflux disease)   . Hepatitis 11/08/2011   "not sure which one"  . History of stomach ulcers 11/08/2011  . Homelessness   . Hypertension   . Mental disorder 11/08/2011   "I get crazy; I take medicine"  . Myocardial infarction [facility])    "think so; when I was young; used to go to heart dr"  . Pneumonia   . Schizo-affective schizophrenia (HCC)   . Seizures (HCC) 11/08/2011   "jerking kind"  . Shortness of breath 11/08/2011   "lying down"  . Stroke (HCC) 11/08/2011   points to right chest  . Thoracic aortic aneurysm [facility]) 2012   4.4cm in October 2017    Patient Active Problem List   Diagnosis Date Noted  . Tobacco abuse 06/16/2017  . Poor dentition 06/16/2017  . Thoracic aortic aneurysm (HCC) 06/16/2017  . Bipolar disorder (HCC) 06/15/2017  . Seborrheic dermatitis 06/15/2017  . HTN (hypertension) 06/15/2017  . Major neurocognitive disorder (HCC) 06/15/2017  . Chronic pancreatitis (HCC) 11/14/2011  . Cannabis abuse 11/09/2011  . Hepatitis 11/08/2011  . Hyperglycemia 11/08/2011  . Alcohol abuse 11/08/2011  . Transaminasemia  11/08/2011  . Elevated lipase 11/08/2011  . Chronic back pain 02/07/2011  . Insomnia 02/07/2011    Past Surgical History:  Procedure Laterality Date  . CHOLECYSTECTOMY  2008  . EUS  11/14/2011   Procedure: FULL UPPER ENDOSCOPIC ULTRASOUND (EUS) RADIAL;  Surgeon: Willis Modena, MD;  Location: [facility];  Service: Endoscopy;  Laterality: N/A;  to be [facility]linked from [facility]       Family History  Problem Relation Age of Onset  . Hypertension Mother     Social History   Tobacco Use  . Smoking status: Current Every Day Smoker    Packs/day: 0.50    Years: 29.00    Pack years: 14.50    Types: Cigarettes  . Smokeless tobacco: Never Used  Vaping Use  . Vaping Use: Never used  Substance Use Topics  . Alcohol use: Yes    Comment: 11/08/2011 "4-5 bottles beer/wk"  . Drug use: Not Currently    Home Medications Prior to Admission medications   Medication Sig Start Date End Date Taking? Authorizing Provider  oxyCODONE-acetaminophen (PERCOCET) 5-325 MG tablet Take 2 tablets by mouth every 4 (four) hours as needed. 08/13/20  Yes Irisha Grandmaison, Barbara Cower, MD  acetaminophen (TYLENOL) 325 MG tablet Take 2 tablets (650 mg total) by mouth every 6 (six) hours as needed. 07/11/18   Eyvonne Mechanic, PA-C  amLODipine (NORVASC) 5 MG tablet Take 1 tablet (5 mg total) by mouth daily. 06/15/17   Casey Burkitt, MD  divalproex (DEPAKOTE) 500 MG DR tablet Take 1,000 mg by mouth at bedtime.    [provider]  erythromycin ophthalmic ointment Place a 1/2 inch ribbon of ointment into the lower eyelid 4 times daily for 7 days. 11/07/18   Couture, Cortni S, PA-C  fluticasone (FLONASE) 50 MCG/ACT nasal spray Place into both nostrils daily.    [provider]  haloperidol decanoate (HALDOL DECANOATE) 50 MG/ML injection Inject 50 mg into the muscle every 28 (twenty-eight) days.    [provider]  HYDROcodone-acetaminophen (NORCO/VICODIN) 5-325 MG tablet Take 1-2 tablets by mouth every 6  (six) hours as needed for severe pain. 08/06/20   Allayne Stack, DO  ondansetron (ZOFRAN ODT) 4 MG disintegrating tablet Take 1 tablet (4 mg total) by mouth every 8 (eight) hours as needed for nausea or vomiting. 06/27/18   Joy, Shawn C, PA-C    Allergies    Patient has no known allergies.  Review of Systems   Review of Systems  All other systems reviewed and are negative.   Physical Exam Updated Vital Signs BP (!) 145/92   Pulse (!) 57   Temp 98.6 F (37 C) (Oral)   Resp 14   SpO2 97%   Physical Exam Vitals and nursing note reviewed.  Constitutional:      Appearance: He is well-developed.  HENT:     Head: Normocephalic and atraumatic.     Nose: Nose normal. No congestion or rhinorrhea.     Mouth/Throat:     Mouth: Mucous membranes are moist.     Pharynx: Oropharynx is clear.  Eyes:     Pupils: Pupils are equal, round, and reactive to light.  Cardiovascular:     Rate and Rhythm: Normal rate.  Pulmonary:     Effort: Pulmonary effort is normal. No respiratory distress.  Abdominal:     General: Abdomen is flat. There is no distension.  Musculoskeletal:        General: Swelling and tenderness present. Normal range of motion.     Cervical back: Normal range of motion.     Right lower leg: Edema present.  Skin:    General: Skin is warm and dry.  Neurological:     General: No focal deficit present.     Mental Status: He is alert.     ED Results / Procedures / Treatments   Labs (all labs ordered are listed, but only abnormal results are displayed) Labs Reviewed - No data to display  EKG None  Radiology No results found.  Procedures Procedures   Medications Ordered in ED Medications  oxyCODONE-acetaminophen (PERCOCET/ROXICET) 5-325 MG per tablet 2 tablet (2 tablets Oral Given 08/13/20 0549)    ED Course  I have reviewed the triage vital signs and the nursing notes.  Pertinent labs & imaging results that were available during my care of the patient were  reviewed by me and considered in my medical decision making (see chart for details).    MDM Rules/Calculators/A&P                          Will reapply splint. Already has crutches. Pain meds given.  Final Clinical Impression(s) / ED Diagnoses Final diagnoses:  Acute left ankle pain  Closed bimalleolar fracture of left ankle with routine healing, subsequent encounter    Rx / DC  Orders ED Discharge Orders         Ordered    oxyCODONE-acetaminophen (PERCOCET) 5-325 MG tablet  Every 4 hours PRN        08/13/20 0604           Deandre Stansel, Barbara Cower, MD 08/13/20 [phone]

## 2020-10-18 ENCOUNTER — Emergency Department (HOSPITAL_COMMUNITY)
Admission: EM | Admit: 2020-10-18 | Discharge: 2020-10-18 | Disposition: A | Discharge status: Home / Self Care | Payer: Medicaid Other
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
## 2020-10-28 ENCOUNTER — Emergency Department (HOSPITAL_COMMUNITY): Payer: Medicaid Other

## 2020-10-28 ENCOUNTER — Other Ambulatory Visit: Payer: Self-pay

## 2020-10-28 ENCOUNTER — Emergency Department (HOSPITAL_COMMUNITY)
Admission: EM | Admit: 2020-10-28 | Discharge: 2020-10-28 | Disposition: A | Discharge status: Home / Self Care | Payer: Medicaid Other | Attending: Emergency Medicine | Admitting: Emergency Medicine

## 2020-10-28 DIAGNOSIS — I10 Essential (primary) hypertension: Secondary | ICD-10-CM | POA: Insufficient documentation

## 2020-10-28 DIAGNOSIS — M25572 Pain in left ankle and joints of left foot: Secondary | ICD-10-CM | POA: Insufficient documentation

## 2020-10-28 DIAGNOSIS — F17210 Nicotine dependence, cigarettes, uncomplicated: Secondary | ICD-10-CM | POA: Insufficient documentation

## 2020-10-28 DIAGNOSIS — S82832K Other fracture of upper and lower end of left fibula, subsequent encounter for closed fracture with nonunion: Secondary | ICD-10-CM

## 2020-10-28 DIAGNOSIS — R2242 Localized swelling, mass and lump, left lower limb: Secondary | ICD-10-CM | POA: Insufficient documentation

## 2020-10-28 DIAGNOSIS — Z79899 Other long term (current) drug therapy: Secondary | ICD-10-CM | POA: Insufficient documentation

## 2020-10-28 MED ORDER — MORPHINE SULFATE (PF) 4 MG/ML IV SOLN
4.0000 mg | Freq: Once | INTRAVENOUS | Status: AC
Start: 2020-10-28 — End: 2020-10-28
  Administered 2020-10-28: 4 mg via INTRAMUSCULAR
  Filled 2020-10-28: qty 1
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
## 2020-10-28 NOTE — Discharge Instructions (Addendum)
You need to talk to your doctor about pain medications  Follow-up with orthopedic doctor  Use crutches  Return to ER if you have worse leg pain, unable to walk
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
## 2020-10-28 NOTE — Progress Notes (Signed)
Orthopedic Tech Progress Note Patient Details:  Dustin Hansen 1970/08/23 [id number]  Ortho Devices Type of Ortho Device: Crutches Ortho Device/Splint Interventions: Ordered, Application, Adjustment   Post Interventions Patient Tolerated: Well Instructions Provided: Care of device, Adjustment of device  Trinna Post 10/28/2020, 11:40 PM

## 2020-10-30 ENCOUNTER — Other Ambulatory Visit: Payer: Self-pay

## 2020-10-30 ENCOUNTER — Encounter (HOSPITAL_COMMUNITY): Payer: Self-pay | Admitting: Emergency Medicine

## 2020-10-30 ENCOUNTER — Emergency Department (HOSPITAL_COMMUNITY)
Admission: EM | Admit: 2020-10-30 | Discharge: 2020-10-30 | Disposition: A | Discharge status: Home / Self Care | Payer: Medicaid Other | Attending: Emergency Medicine | Admitting: Emergency Medicine

## 2020-10-30 DIAGNOSIS — M25572 Pain in left ankle and joints of left foot: Secondary | ICD-10-CM | POA: Insufficient documentation

## 2020-10-30 DIAGNOSIS — F17210 Nicotine dependence, cigarettes, uncomplicated: Secondary | ICD-10-CM | POA: Insufficient documentation

## 2020-10-30 DIAGNOSIS — I10 Essential (primary) hypertension: Secondary | ICD-10-CM | POA: Insufficient documentation

## 2020-10-30 DIAGNOSIS — G8929 Other chronic pain: Secondary | ICD-10-CM

## 2020-10-30 DIAGNOSIS — Z79899 Other long term (current) drug therapy: Secondary | ICD-10-CM | POA: Insufficient documentation

## 2020-10-30 DIAGNOSIS — M25579 Pain in unspecified ankle and joints of unspecified foot: Secondary | ICD-10-CM

## 2020-10-30 MED ORDER — MELOXICAM 15 MG PO TABS: 15.0000 mg | ORAL_TABLET | Freq: Every day | ORAL | 0 refills | Status: AC

## 2020-10-30 NOTE — ED Notes (Signed)
No answer in lobby.
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
## 2020-10-30 NOTE — ED Notes (Signed)
Discharged by PA at triage. 

## 2020-10-30 NOTE — Discharge Instructions (Addendum)
Follow up with orthopedics on Monday as scheduled.
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
## 2020-10-30 NOTE — ED Notes (Signed)
Pt returned to lobby.  States he was outside.

## 2020-11-11 ENCOUNTER — Emergency Department (HOSPITAL_COMMUNITY)
Admission: EM | Admit: 2020-11-11 | Discharge: 2020-11-11 | Disposition: A | Discharge status: Home / Self Care | Payer: Medicaid Other | Attending: Emergency Medicine | Admitting: Emergency Medicine

## 2020-11-11 DIAGNOSIS — F17210 Nicotine dependence, cigarettes, uncomplicated: Secondary | ICD-10-CM | POA: Insufficient documentation

## 2020-11-11 DIAGNOSIS — Z79899 Other long term (current) drug therapy: Secondary | ICD-10-CM | POA: Diagnosis not present

## 2020-11-11 DIAGNOSIS — G8929 Other chronic pain: Secondary | ICD-10-CM | POA: Diagnosis not present

## 2020-11-11 DIAGNOSIS — M25572 Pain in left ankle and joints of left foot: Secondary | ICD-10-CM | POA: Insufficient documentation

## 2020-11-11 DIAGNOSIS — I10 Essential (primary) hypertension: Secondary | ICD-10-CM | POA: Insufficient documentation

## 2020-11-11 MED ORDER — CYCLOBENZAPRINE HCL 10 MG PO TABS: 10.0000 mg | ORAL_TABLET | Freq: Two times a day (BID) | ORAL | 0 refills | Status: AC | PRN

## 2020-11-11 MED ORDER — IBUPROFEN 600 MG PO TABS: 600.0000 mg | ORAL_TABLET | Freq: Four times a day (QID) | ORAL | 0 refills | Status: DC | PRN

## 2020-11-11 NOTE — ED Provider Notes (Signed)
[facility] EMERGENCY DEPARTMENT Provider Note   CSN: [id number] Arrival date & time: 11/11/20  1735     History Chief Complaint  Patient presents with   Ankle Pain    L    Dustin Hansen is a 50 y.o. male.  The history is provided by the patient and medical records. No language interpreter was used.  Ankle Pain Associated symptoms: no fever     50 year old male with sxs hx of bipolar, homelessness, chronic pain, previous left ankle fx several months ago presenting requesting for pain management of his left ankle.  He mentioned previous provider prescribed meloxicam for his ankle pain but it doesn't help with his pain level.  Still endorse persistent throbbing L ankle pain when ambulating.  No report of fever or chills.  Requesting for percocet as it has helped in the past.  Past Medical History:  Diagnosis Date   Anxiety    Arthritis 11/08/2011   "feet and knees"   Back pain, chronic    Bipolar 1 disorder (HCC)    Burning with urination 11/08/2011   Chronic bronchitis (HCC) 11/08/2011   "get it q month"   Daily headache 11/08/2011   Depression    GERD (gastroesophageal reflux disease)    Hepatitis 11/08/2011   "not sure which one"   History of stomach ulcers 11/08/2011   Homelessness    Hypertension    Mental disorder 11/08/2011   "I get crazy; I take medicine"   Myocardial infarction [facility])    "think so; when I was young; used to go to heart dr"   Pneumonia    Schizo-affective schizophrenia (HCC)    Seizures (HCC) 11/08/2011   "jerking kind"   Shortness of breath 11/08/2011   "lying down"   Stroke (HCC) 11/08/2011   points to right chest   Thoracic aortic aneurysm (HCC) 2012   4.4cm in October 2017    Patient Active Problem List   Diagnosis Date Noted   Tobacco abuse 06/16/2017   Poor dentition 06/16/2017   Thoracic aortic aneurysm (HCC) 06/16/2017   Bipolar disorder (HCC) 06/15/2017   Seborrheic dermatitis 06/15/2017   HTN (hypertension) 06/15/2017    Major neurocognitive disorder (HCC) 06/15/2017   Chronic pancreatitis (HCC) 11/14/2011   Cannabis abuse 11/09/2011   Hepatitis 11/08/2011   Hyperglycemia 11/08/2011   Alcohol abuse 11/08/2011   Transaminasemia 11/08/2011   Elevated lipase 11/08/2011   Chronic back pain 02/07/2011   Insomnia 02/07/2011    Past Surgical History:  Procedure Laterality Date   CHOLECYSTECTOMY  2008   EUS  11/14/2011   Procedure: FULL UPPER ENDOSCOPIC ULTRASOUND (EUS) RADIAL;  Surgeon: Willis Modena, MD;  Location: [facility];  Service: Endoscopy;  Laterality: N/A;  to be carelinked from [facility]       Family History  Problem Relation Age of Onset   Hypertension Mother     Social History   Tobacco Use   Smoking status: Every Day    Packs/day: 0.50    Years: 29.00    Pack years: 14.50    Types: Cigarettes   Smokeless tobacco: Never  Vaping Use   Vaping Use: Never used  Substance Use Topics   Alcohol use: Yes    Comment: 11/08/2011 "4-5 bottles beer/wk"   Drug use: Not Currently    Home Medications Prior to Admission medications   Medication Sig Start Date End Date Taking? Authorizing Provider  acetaminophen (TYLENOL) 325 MG tablet Take 2 tablets (650 mg total) by mouth  every 6 (six) hours as needed. 07/11/18   Hedges, Tinnie Gens, PA-C  amLODipine (NORVASC) 5 MG tablet Take 1 tablet (5 mg total) by mouth daily. 06/15/17   Casey Burkitt, MD  divalproex (DEPAKOTE) 500 MG DR tablet Take 1,000 mg by mouth at bedtime.    [provider]  erythromycin ophthalmic ointment Place a 1/2 inch ribbon of ointment into the lower eyelid 4 times daily for 7 days. 11/07/18   Couture, Cortni S, PA-C  fluticasone (FLONASE) 50 MCG/ACT nasal spray Place into both nostrils daily.    [provider]  haloperidol decanoate (HALDOL DECANOATE) 50 MG/ML injection Inject 50 mg into the muscle every 28 (twenty-eight) days.    [provider]  HYDROcodone-acetaminophen (NORCO/VICODIN)  5-325 MG tablet Take 1-2 tablets by mouth every 6 (six) hours as needed for severe pain. 08/06/20   Allayne Stack, DO  ondansetron (ZOFRAN ODT) 4 MG disintegrating tablet Take 1 tablet (4 mg total) by mouth every 8 (eight) hours as needed for nausea or vomiting. 06/27/18   Joy, Shawn C, PA-C  oxyCODONE-acetaminophen (PERCOCET) 5-325 MG tablet Take 2 tablets by mouth every 4 (four) hours as needed. 08/13/20   Mesner, Barbara Cower, MD    Allergies    Patient has no known allergies.  Review of Systems   Review of Systems  Constitutional:  Negative for fever.  Musculoskeletal:  Positive for arthralgias.  Neurological:  Negative for numbness.   Physical Exam Updated Vital Signs BP (!) 160/104 (BP Location: Right Arm)   Pulse 80   Temp 99.1 F (37.3 C) (Oral)   Resp 18   SpO2 98%   Physical Exam Vitals and nursing note reviewed.  Constitutional:      General: He is not in acute distress.    Appearance: He is well-developed.  HENT:     Head: Atraumatic.  Eyes:     Conjunctiva/sclera: Conjunctivae normal.  Musculoskeletal:        General: Tenderness (L ankle: normal appearance, ttp to gentle palpation of the ankle without crepitus or swelling noted.  DP pulse palpable.) present.     Cervical back: Neck supple.  Skin:    Findings: No rash.  Neurological:     Mental Status: He is alert.    ED Results / Procedures / Treatments   Labs (all labs ordered are listed, but only abnormal results are displayed) Labs Reviewed - No data to display  EKG None  Radiology No results found.  Procedures Procedures   Medications Ordered in ED Medications - No data to display  ED Course  I have reviewed the triage vital signs and the nursing notes.  Pertinent labs & imaging results that were available during my care of the patient were reviewed by me and considered in my medical decision making (see chart for details).    MDM Rules/Calculators/A&P                           BP (!)  160/104 (BP Location: Right Arm)   Pulse 80   Temp 99.1 F (37.3 C) (Oral)   Resp 18   SpO2 98%   Final Clinical Impression(s) / ED Diagnoses Final diagnoses:  Chronic pain of left ankle    Rx / DC Orders ED Discharge Orders     None      5:52 PM Pt has recurrent pain to L ankle from prior injury.  No new injury. No evidence of infection.  Will apply ASO for support and NSAIDS for sxs control.    Fayrene Helper, PA-C 11/11/20 1754    Koleen Distance, MD 11/11/20 [phone]

## 2020-11-11 NOTE — Discharge Instructions (Addendum)
Please wear ankle brace for support.  Take ibuprofen and flexeril as needed for pain.  Follow up with your doctor for further care.

## 2020-11-11 NOTE — ED Triage Notes (Signed)
Pt c/o L ankle pain x40mos, unable to sleep due to pain. Seen multiple times recently for pain

## 2021-05-28 ENCOUNTER — Emergency Department (HOSPITAL_COMMUNITY)
Admission: EM | Admit: 2021-05-28 | Discharge: 2021-05-29 | Disposition: A | Discharge status: Home / Self Care | Payer: Medicaid Other | Attending: Emergency Medicine | Admitting: Emergency Medicine

## 2021-05-28 DIAGNOSIS — Z20822 Contact with and (suspected) exposure to covid-19: Secondary | ICD-10-CM | POA: Diagnosis not present

## 2021-05-28 DIAGNOSIS — M791 Myalgia, unspecified site: Secondary | ICD-10-CM | POA: Insufficient documentation

## 2021-05-28 DIAGNOSIS — R051 Acute cough: Secondary | ICD-10-CM | POA: Insufficient documentation

## 2021-05-28 DIAGNOSIS — R059 Cough, unspecified: Secondary | ICD-10-CM | POA: Diagnosis present

## 2021-05-29 ENCOUNTER — Emergency Department (HOSPITAL_COMMUNITY): Payer: Medicaid Other

## 2021-05-29 ENCOUNTER — Encounter (HOSPITAL_COMMUNITY): Payer: Self-pay

## 2021-05-29 ENCOUNTER — Other Ambulatory Visit: Payer: Self-pay

## 2021-05-29 LAB — RESP PANEL BY RT-PCR (FLU A&B, COVID) ARPGX2
Influenza A by PCR: NEGATIVE
Influenza B by PCR: NEGATIVE
SARS Coronavirus 2 by RT PCR: NEGATIVE

## 2021-05-29 MED ORDER — ACETAMINOPHEN 325 MG PO TABS
650.0000 mg | ORAL_TABLET | Freq: Once | ORAL | Status: AC
  Administered 2021-05-29: 650 mg via ORAL
  Filled 2021-05-29: qty 2

## 2021-05-29 MED ORDER — ACETAMINOPHEN ER 650 MG PO TBCR: 650.0000 mg | EXTENDED_RELEASE_TABLET | Freq: Three times a day (TID) | ORAL | 0 refills | Status: AC | PRN

## 2021-05-29 MED ORDER — BENZONATATE 100 MG PO CAPS: 100.0000 mg | ORAL_CAPSULE | Freq: Three times a day (TID) | ORAL | 0 refills | Status: AC

## 2021-05-29 MED ORDER — BENZONATATE 100 MG PO CAPS
100.0000 mg | ORAL_CAPSULE | Freq: Once | ORAL | Status: AC
  Administered 2021-05-29: 100 mg via ORAL
  Filled 2021-05-29: qty 1

## 2021-05-29 NOTE — ED Provider Triage Note (Signed)
Emergency Medicine Provider Triage Evaluation Note ? ?Dustin Hansen , a 51 y.o. male  was evaluated in triage.  Pt complains of cough.  Patient reports that he has been feeling unwell over the last 2 days.  Endorses productive cough.  Patient is unsure if he has been having any fevers or chills. ? ?Review of Systems  ?Positive: Cough ?Negative: Rhinorrhea, nasal congestion, chest pain, shortness of breath ? ?Physical Exam  ?BP 115/64 (BP Location: Right Arm)   Pulse 91   Temp 98.3 ?F (36.8 ?C) (Oral)   Resp 19   SpO2 98%  ?Gen:   Awake, no distress   ?Resp:  Normal effort, clear to auscultation bilaterally ?MSK:   Moves extremities without difficulty  ?Other:   ? ?Medical Decision Making  ?Medically screening exam initiated at 12:04 AM.  Appropriate orders placed.  Dustin Hansen was informed that the remainder of the evaluation will be completed by another provider, this initial triage assessment does not replace that evaluation, and the importance of remaining in the ED until their evaluation is complete. ? ? ?  ?Dustin Schroeder, PA-C ?05/29/21 0004 ? ?

## 2021-05-29 NOTE — ED Notes (Signed)
Refused virus swab.  ?

## 2021-05-29 NOTE — ED Triage Notes (Addendum)
Arrives EMS from home with c/o cough and "not feeling good" x 2 days.  ?

## 2021-05-29 NOTE — ED Provider Notes (Cosign Needed)
[facility] EMERGENCY [facility] Provider Note   CSN: [id number] Arrival date & time: 05/28/21  2331     History  Chief Complaint  Patient presents with   Cough    Dustin Hansen is a 51 y.o. male with a past medical history significant for chronic back pain, hepatitis, alcohol abuse, bipolar disorder, marijuana abuse, and hypertension who presents to the ED due to cough and myalgias x2 days.  No fever or chills.  Denies shortness of breath and chest pain.  Denies abdominal pain, nausea, vomiting, diarrhea.  No sick contacts or known COVID exposures.       Home Medications Prior to Admission medications   Medication Sig Start Date End Date Taking? Authorizing Provider  acetaminophen (TYLENOL 8 HOUR) 650 MG CR tablet Take 1 tablet (650 mg total) by mouth every 8 (eight) hours as needed for pain. 05/29/21  Yes Jayjay Littles, Merla Richesaroline C, PA-C  benzonatate (TESSALON) 100 MG capsule Take 1 capsule (100 mg total) by mouth every 8 (eight) hours. 05/29/21  Yes Jillayne Witte, Merla Richesaroline C, PA-C  acetaminophen (TYLENOL) 325 MG tablet Take 2 tablets (650 mg total) by mouth every 6 (six) hours as needed. 07/11/18   Hedges, Tinnie GensJeffrey, PA-C  amLODipine (NORVASC) 5 MG tablet Take 1 tablet (5 mg total) by mouth daily. 06/15/17   Casey BurkittFitzgerald, Hillary Moen, MD  cyclobenzaprine (FLEXERIL) 10 MG tablet Take 1 tablet (10 mg total) by mouth 2 (two) times daily as needed for muscle spasms. 11/11/20   Fayrene Helperran, Bowie, PA-C  divalproex (DEPAKOTE) 500 MG DR tablet Take 1,000 mg by mouth at bedtime.    [provider]  erythromycin ophthalmic ointment Place a 1/2 inch ribbon of ointment into the lower eyelid 4 times daily for 7 days. 11/07/18   Couture, Cortni S, PA-C  fluticasone (FLONASE) 50 MCG/ACT nasal spray Place into both nostrils daily.    [provider]  haloperidol decanoate (HALDOL DECANOATE) 50 MG/ML injection Inject 50 mg into the muscle every 28 (twenty-eight) days.    [provider]  HYDROcodone-acetaminophen (NORCO/VICODIN) 5-325 MG tablet Take 1-2 tablets by mouth every 6 (six) hours as needed for severe pain. 08/06/20   Allayne StackBeard, Samantha N, DO  ibuprofen (ADVIL) 600 MG tablet Take 1 tablet (600 mg total) by mouth every 6 (six) hours as needed. 11/11/20   Fayrene Helperran, Bowie, PA-C  ondansetron (ZOFRAN ODT) 4 MG disintegrating tablet Take 1 tablet (4 mg total) by mouth every 8 (eight) hours as needed for nausea or vomiting. 06/27/18   Joy, Shawn C, PA-C  oxyCODONE-acetaminophen (PERCOCET) 5-325 MG tablet Take 2 tablets by mouth every 4 (four) hours as needed. 08/13/20   Mesner, Barbara CowerJason, MD      Allergies    Patient has no known allergies.    Review of Systems   Review of Systems  Constitutional:  Negative for chills and fever.  Respiratory:  Positive for cough. Negative for shortness of breath.   Cardiovascular:  Negative for chest pain.  Gastrointestinal:  Negative for abdominal pain, diarrhea, nausea and vomiting.   Physical Exam Updated Vital Signs BP (!) 170/106 (BP Location: Left Arm)    Pulse 79    Temp 98.3 F (36.8 C) (Oral)    Resp 18    SpO2 97%  Physical Exam Vitals and nursing note reviewed.  Constitutional:      General: He is not in acute distress.    Appearance: He is not ill-appearing.  HENT:     Head: Normocephalic.  Eyes:     Pupils: Pupils are equal, round, and reactive to light.  Cardiovascular:     Rate and Rhythm: Normal rate and regular rhythm.     Pulses: Normal pulses.     Heart sounds: Normal heart sounds. No murmur heard.   No friction rub. No gallop.  Pulmonary:     Effort: Pulmonary effort is normal.     Breath sounds: Normal breath sounds.     Comments: Respirations equal and unlabored, patient able to speak in full sentences, lungs clear to auscultation bilaterally Abdominal:     General: Abdomen is flat. There is no distension.     Palpations: Abdomen is soft.     Tenderness: There is no abdominal tenderness. There is no guarding or  rebound.  Musculoskeletal:        General: Normal range of motion.     Cervical back: Neck supple.  Skin:    General: Skin is warm and dry.  Neurological:     General: No focal deficit present.     Mental Status: He is alert.  Psychiatric:        Mood and Affect: Mood normal.        Behavior: Behavior normal.    ED Results / Procedures / Treatments   Labs (all labs ordered are listed, but only abnormal results are displayed) Labs Reviewed  RESP PANEL BY RT-PCR (FLU A&B, COVID) [id number]    EKG None  Radiology DG Chest 2 View  Result Date: 05/29/2021 CLINICAL DATA:  Cough. EXAM: CHEST - 2 VIEW COMPARISON:  Chest radiograph dated 10/22/2016. FINDINGS: Diffuse chronic interstitial coarsening. No focal consolidation, pleural effusion, pneumothorax. The cardiac silhouette is within normal limits. No acute osseous pathology. IMPRESSION: No active cardiopulmonary disease. Electronically Signed   By: Elgie Collard M.D.   On: 05/29/2021 00:53    Procedures Procedures    Medications Ordered in ED Medications  benzonatate (TESSALON) capsule 100 mg (has no administration in time range)  acetaminophen (TYLENOL) tablet 650 mg (has no administration in time range)    ED Course/ Medical Decision Making/ A&P                           Medical Decision Making Amount and/or Complexity of Data Reviewed Radiology: ordered and independent interpretation performed. Decision-making details documented in ED Course.   51 year old male presents to the ED due to cough and myalgias x2 days.  No sick contacts or known COVID exposures; however, patient works at [employer].  Upon arrival, vitals all within normal limits.  Patient is afebrile, not tachycardic or hypoxic.  Patient in no acute distress.  Benign physical exam.  Lungs clear to auscultation bilaterally.  Abdomen soft, nondistended, nontender.  Chest x-ray ordered at triage which I personally reviewed and interpreted which is negative for  signs of pneumonia, pneumothorax, or widened mediastinum.  Patient given Tessalon and Tylenol.  Discharged with same.  Suspect viral etiology.  COVID/influenza test pending.  Advised patient to follow-up with PCP if symptoms not improve over the next week. Strict ED precautions discussed with patient. Patient states understanding and agrees to plan. Patient discharged home in no acute distress and stable vitals        Final Clinical Impression(s) / ED Diagnoses Final diagnoses:  Acute cough    Rx / DC Orders ED Discharge Orders          Ordered    benzonatate (TESSALON) 100 MG capsule  Every 8 hours        05/29/21 0647    acetaminophen (TYLENOL 8 HOUR) 650 MG CR tablet  Every 8 hours PRN        05/29/21 0647              Mannie Stabile, PA-C 05/29/21 [phone]

## 2021-05-29 NOTE — Discharge Instructions (Addendum)
It was a pleasure taking care of you today.  As discussed, your COVID and flu test are pending.  Results will be available in the next few hours on MyChart.  Your chest x-ray did not show any signs of pneumonia.  I suspect you have a viral infection causing your cough.  I am sending you home with cough medication and Tylenol.  Take as needed.  Please follow-up with PCP if symptoms do not improve over the next week.  Return to the ER for any worsening symptoms. ?

## 2021-08-01 ENCOUNTER — Encounter (HOSPITAL_COMMUNITY): Payer: Self-pay

## 2021-08-01 ENCOUNTER — Emergency Department (HOSPITAL_COMMUNITY): Payer: Medicaid Other

## 2021-08-01 ENCOUNTER — Other Ambulatory Visit: Payer: Self-pay

## 2021-08-01 ENCOUNTER — Emergency Department (HOSPITAL_COMMUNITY)
Admission: EM | Admit: 2021-08-01 | Discharge: 2021-08-01 | Disposition: A | Discharge status: Home / Self Care | Payer: Medicaid Other | Attending: Emergency Medicine | Admitting: Emergency Medicine

## 2021-08-01 DIAGNOSIS — S93402A Sprain of unspecified ligament of left ankle, initial encounter: Secondary | ICD-10-CM | POA: Diagnosis not present

## 2021-08-01 DIAGNOSIS — W010XXA Fall on same level from slipping, tripping and stumbling without subsequent striking against object, initial encounter: Secondary | ICD-10-CM | POA: Insufficient documentation

## 2021-08-01 DIAGNOSIS — S99912A Unspecified injury of left ankle, initial encounter: Secondary | ICD-10-CM | POA: Diagnosis present

## 2021-08-01 NOTE — ED Notes (Signed)
Pt keep urinating in the floor. Provided pt with urinal and instructed him on how to use the urinal or call out for help.  ?

## 2021-08-01 NOTE — ED Provider Notes (Signed)
?[facility] EMERGENCY DEPARTMENT ?Provider Note ? ? ?CSN: [id number] ?Arrival date & time: 08/01/21  1716 ? ?  ? ?History ? ?No chief complaint on file. ? ? ?Dustin Hansen is a 51 y.o. male. ? ?51 year old male presents with complaint of left ankle pain after slipping off a trailer landing hard in his left ankle.  Injury occurred earlier today. ? ?Patient without other other complaint. ? ?Patient is able to ambulate. ? ?The history is provided by the patient and medical records.  ?Illness ?Location:  Injury to left ankle ?Severity:  Mild ?Onset quality:  Sudden ?Duration:  6 hours ?Timing:  Rare ?Progression:  Waxing and waning ?Chronicity:  New ? ?  ? ?Home Medications ?Prior to Admission medications   ?Medication Sig Start Date End Date Taking? Authorizing Provider  ?acetaminophen (TYLENOL 8 HOUR) 650 MG CR tablet Take 1 tablet (650 mg total) by mouth every 8 (eight) hours as needed for pain. 05/29/21   Mannie Stabile, PA-C  ?acetaminophen (TYLENOL) 325 MG tablet Take 2 tablets (650 mg total) by mouth every 6 (six) hours as needed. 07/11/18   Hedges, Tinnie Gens, PA-C  ?amLODipine (NORVASC) 5 MG tablet Take 1 tablet (5 mg total) by mouth daily. 06/15/17   Casey Burkitt, MD  ?benzonatate (TESSALON) 100 MG capsule Take 1 capsule (100 mg total) by mouth every 8 (eight) hours. 05/29/21   Mannie Stabile, PA-C  ?cyclobenzaprine (FLEXERIL) 10 MG tablet Take 1 tablet (10 mg total) by mouth 2 (two) times daily as needed for muscle spasms. 11/11/20   Fayrene Helper, PA-C  ?divalproex (DEPAKOTE) 500 MG DR tablet Take 1,000 mg by mouth at bedtime.    [provider]  ?erythromycin ophthalmic ointment Place a 1/2 inch ribbon of ointment into the lower eyelid 4 times daily for 7 days. 11/07/18   Couture, Cortni S, PA-C  ?fluticasone (FLONASE) 50 MCG/ACT nasal spray Place into both nostrils daily.    [provider]  ?haloperidol decanoate (HALDOL DECANOATE) 50 MG/ML injection Inject 50 mg  into the muscle every 28 (twenty-eight) days.    [provider]  ?HYDROcodone-acetaminophen (NORCO/VICODIN) 5-325 MG tablet Take 1-2 tablets by mouth every 6 (six) hours as needed for severe pain. 08/06/20   Allayne Stack, DO  ?ibuprofen (ADVIL) 600 MG tablet Take 1 tablet (600 mg total) by mouth every 6 (six) hours as needed. 11/11/20   Fayrene Helper, PA-C  ?ondansetron (ZOFRAN ODT) 4 MG disintegrating tablet Take 1 tablet (4 mg total) by mouth every 8 (eight) hours as needed for nausea or vomiting. 06/27/18   Joy, Shawn C, PA-C  ?oxyCODONE-acetaminophen (PERCOCET) 5-325 MG tablet Take 2 tablets by mouth every 4 (four) hours as needed. 08/13/20   Mesner, Barbara Cower, MD  ?   ? ?Allergies    ?Patient has no known allergies.   ? ?Review of Systems   ?Review of Systems  ?All other systems reviewed and are negative. ? ?Physical Exam ?Updated Vital Signs ?BP (!) 166/105 (BP Location: Left Arm)   Pulse 80   Temp 98.1 ?F (36.7 ?C) (Oral)   Resp 16   SpO2 96%  ?Physical Exam ?Vitals and nursing note reviewed.  ?Constitutional:   ?   General: He is not in acute distress. ?   Appearance: Normal appearance. He is well-developed.  ?HENT:  ?   Head: Normocephalic and atraumatic.  ?Eyes:  ?   Conjunctiva/sclera: Conjunctivae normal.  ?   Pupils: Pupils are equal, round, and reactive  to light.  ?Cardiovascular:  ?   Rate and Rhythm: Normal rate and regular rhythm.  ?   Heart sounds: Normal heart sounds.  ?Pulmonary:  ?   Effort: Pulmonary effort is normal. No respiratory distress.  ?   Breath sounds: Normal breath sounds.  ?Abdominal:  ?   General: There is no distension.  ?   Palpations: Abdomen is soft.  ?   Tenderness: There is no abdominal tenderness.  ?Musculoskeletal:     ?   General: Tenderness present. No deformity. Normal range of motion.  ?   Cervical back: Normal range of motion and neck supple.  ?   Comments: Mild tenderness noted to the lateral aspect of the left ankle.  No open wound or abrasion or laceration  noted. ? ?Mild edema noted to the left lateral ankle.  ?Skin: ?   General: Skin is warm and dry.  ?Neurological:  ?   General: No focal deficit present.  ?   Mental Status: He is alert and oriented to person, place, and time.  ? ? ?ED Results / Procedures / Treatments   ?Labs ?(all labs ordered are listed, but only abnormal results are displayed) ?Labs Reviewed - No data to display ? ?EKG ?None ? ?Radiology ?DG Ankle Complete Left ? ?Result Date: 08/01/2021 ?CLINICAL DATA:  Fall, injury to foot EXAM: LEFT ANKLE COMPLETE - 3+ VIEW COMPARISON:  None Available. FINDINGS: No fracture or dislocation is seen. The ankle mortise is intact. Soft tissue dressing/gauze overlying the lateral aspect of the ankle/hindfoot. This suggests an underlying soft tissue laceration, although the area is obscured. IMPRESSION: No fracture or dislocation is seen. Electronically Signed   By: Charline BillsSriyesh  Krishnan M.D.   On: 08/01/2021 19:13   ? ?Procedures ?Procedures  ? ? ?Medications Ordered in ED ?Medications - No data to display ? ?ED Course/ Medical Decision Making/ A&P ?  ?                        ?Medical Decision Making ? ? ?Medical Screen Complete ? ?This patient presented to the ED with complaint of left ankle injury. ? ?This complaint involves an extensive number of treatment options. The initial differential diagnosis includes, but is not limited to, sprain versus fracture ? ?This presentation is: Chronic, Self-Limited, Previously Undiagnosed, and Uncertain Prognosis ? ?Patient is presenting after apparent injury to the left ankle. ? ?Imaging was without evidence of fracture. ? ?Patient's exam is suggestive of sprain. ? ?Ace wrap applied.  Patient is advised to follow-up closely in the outpatient setting.  Strict return precautions given and understood. ? ? ?Additional history obtained: ? ?External records from outside sources obtained and reviewed including prior ED visits and prior Inpatient records.  ? ? ?Imaging Studies  ordered: ? ?I ordered imaging studies including left ankle x-ray ?I independently visualized and interpreted obtained imaging which showed no acute pathology ?I agree with the radiologist interpretation. ?Problem List / ED Course: ? ?Left ankle sprain ? ? ?Reevaluation: ? ?After the interventions noted above, I reevaluated the patient and found that they have: improved ? ? ?Disposition: ? ?After consideration of the diagnostic results and the patients response to treatment, I feel that the patent would benefit from close outpatient follow-up.  ? ? ? ? ? ? ? ? ?Final Clinical Impression(s) / ED Diagnoses ?Final diagnoses:  ?Sprain of left ankle, unspecified ligament, initial encounter  ? ? ?Rx / DC Orders ?ED Discharge Orders   ? ?  None  ? ?  ? ? ?  ?Wynetta Fines, MD ?08/01/21 2147 ? ?

## 2021-08-01 NOTE — ED Triage Notes (Signed)
Patient complains of left ankle pain after slipping off trailer. Pain with ambulation ?

## 2021-08-01 NOTE — ED Notes (Signed)
Discharge instructions reviewed with patient . Pt refused to take home discharge papers stating, "I don't want no papers." VSS when pt left ED.  ?

## 2021-08-01 NOTE — Discharge Instructions (Signed)
Return for any problem.  ? ?Apply ice to your ankle.  Elevate your ankle.  Try to minimize walking on your ankle over the next day or 2. ?

## 2021-08-17 ENCOUNTER — Emergency Department (HOSPITAL_COMMUNITY)
Admission: EM | Admit: 2021-08-17 | Discharge: 2021-08-18 | Disposition: A | Discharge status: Home / Self Care | Payer: Medicaid Other | Attending: Emergency Medicine | Admitting: Emergency Medicine

## 2021-08-17 ENCOUNTER — Emergency Department (HOSPITAL_COMMUNITY): Payer: Medicaid Other

## 2021-08-17 ENCOUNTER — Other Ambulatory Visit: Payer: Self-pay

## 2021-08-17 DIAGNOSIS — X58XXXA Exposure to other specified factors, initial encounter: Secondary | ICD-10-CM | POA: Diagnosis not present

## 2021-08-17 DIAGNOSIS — F10121 Alcohol abuse with intoxication delirium: Secondary | ICD-10-CM | POA: Diagnosis not present

## 2021-08-17 DIAGNOSIS — S92015A Nondisplaced fracture of body of left calcaneus, initial encounter for closed fracture: Secondary | ICD-10-CM | POA: Diagnosis not present

## 2021-08-17 DIAGNOSIS — F10921 Alcohol use, unspecified with intoxication delirium: Secondary | ICD-10-CM

## 2021-08-17 DIAGNOSIS — S99922A Unspecified injury of left foot, initial encounter: Secondary | ICD-10-CM | POA: Diagnosis present

## 2021-08-17 NOTE — ED Provider Triage Note (Signed)
Emergency Medicine Provider Triage Evaluation Note  Dustin Hansen , a 51 y.o. male  was evaluated in triage.  Pt complains of left foot pain.  Review of Systems  Positive: Clinically intoxicated Negative: Seemingly denies other complaints  Physical Exam  BP (!) 149/86   Pulse 65   Resp 20   SpO2 98%  Gen:   Awake, no distress intoxicated Resp:  Normal effort no increased work of breathing MSK:   Moves extremities without difficulty no deformity on the left foot, but he describes pain in the midfoot  Medical Decision Making  Medically screening exam initiated at 7:57 PM.  Appropriate orders placed.  Dustin Hansen was informed that the remainder of the evaluation will be completed by another provider, this initial triage assessment does not replace that evaluation, and the importance of remaining in the ED until their evaluation is complete.   Carmin Muskrat, MD 08/17/21 Casimer Lanius

## 2021-08-17 NOTE — ED Triage Notes (Signed)
Pt here from home via [facility] for L foot pain per an injury he had a week ago. ETOH+. VSS

## 2021-08-17 NOTE — Progress Notes (Signed)
Orthopedic Tech Progress Note Patient Details:  Dustin Hansen 1970/08/24 [id number]  Ortho Devices Type of Ortho Device: Crutches, Short leg splint Ortho Device/Splint Location: LUE Ortho Device/Splint Interventions: Ordered, Application, Adjustment   Post Interventions Patient Tolerated: Well Splint applied, crutch training not yet complete due to patients level of intoxication.  Darleen Crocker 08/17/2021, 11:07 PM

## 2021-08-17 NOTE — Discharge Instructions (Addendum)
You have been diagnosed with a broken heel, or calcaneus.  It is very important that you follow-up with our orthopedic surgeon.  Until that time use the crutches, and do not bear any weight on the broken foot.  For pain control you may take at 1000 mg of Tylenol every 8 hours scheduled.  In addition you can take 0.5 to 1 tablet of Norco every 8 hours as needed for pain not controlled with the scheduled Tylenol.  Drink alcohol only in moderation.  Return here for concerning changes in your condition.

## 2021-08-17 NOTE — ED Provider Notes (Signed)
[facility] EMERGENCY DEPARTMENT Provider Note   CSN: [id number] Arrival date & time: 08/17/21  1935     History  Chief Complaint  Patient presents with   Foot Pain    Dustin Hansen is a 51 y.o. male.    Patient presents with left foot pain.  Patient is clinically intoxicated, but states that his left foot hurts.  He seemingly denies pain in any other area.  History is obtained by the patient and EMS who notes that patient called in due to pain for about 1 week after an injury.  EMS reports vital signs are stable in transport     Home Medications Prior to Admission medications   Medication Sig Start Date End Date Taking? Authorizing Provider  acetaminophen (TYLENOL 8 HOUR) 650 MG CR tablet Take 1 tablet (650 mg total) by mouth every 8 (eight) hours as needed for pain. 05/29/21   Mannie Stabile, PA-C  acetaminophen (TYLENOL) 325 MG tablet Take 2 tablets (650 mg total) by mouth every 6 (six) hours as needed. 07/11/18   Hedges, Tinnie Gens, PA-C  amLODipine (NORVASC) 5 MG tablet Take 1 tablet (5 mg total) by mouth daily. 06/15/17   Casey Burkitt, MD  benzonatate (TESSALON) 100 MG capsule Take 1 capsule (100 mg total) by mouth every 8 (eight) hours. 05/29/21   Mannie Stabile, PA-C  cyclobenzaprine (FLEXERIL) 10 MG tablet Take 1 tablet (10 mg total) by mouth 2 (two) times daily as needed for muscle spasms. 11/11/20   Fayrene Helper, PA-C  divalproex (DEPAKOTE) 500 MG DR tablet Take 1,000 mg by mouth at bedtime.    [provider]  erythromycin ophthalmic ointment Place a 1/2 inch ribbon of ointment into the lower eyelid 4 times daily for 7 days. 11/07/18   Couture, Cortni S, PA-C  fluticasone (FLONASE) 50 MCG/ACT nasal spray Place into both nostrils daily.    [provider]  haloperidol decanoate (HALDOL DECANOATE) 50 MG/ML injection Inject 50 mg into the muscle every 28 (twenty-eight) days.    [provider]  HYDROcodone-acetaminophen  (NORCO/VICODIN) 5-325 MG tablet Take 1-2 tablets by mouth every 6 (six) hours as needed for severe pain. 08/06/20   Allayne Stack, DO  ibuprofen (ADVIL) 600 MG tablet Take 1 tablet (600 mg total) by mouth every 6 (six) hours as needed. 11/11/20   Fayrene Helper, PA-C  ondansetron (ZOFRAN ODT) 4 MG disintegrating tablet Take 1 tablet (4 mg total) by mouth every 8 (eight) hours as needed for nausea or vomiting. 06/27/18   Joy, Shawn C, PA-C  oxyCODONE-acetaminophen (PERCOCET) 5-325 MG tablet Take 2 tablets by mouth every 4 (four) hours as needed. 08/13/20   Mesner, Barbara Cower, MD      Allergies    Patient has no known allergies.    Review of Systems   Review of Systems  All other systems reviewed and are negative.  Physical Exam Updated Vital Signs BP (!) 149/86   Pulse 65   Resp 20   SpO2 98%  Physical Exam Vitals and nursing note reviewed.  Constitutional:      General: He is not in acute distress.    Appearance: He is well-developed.     Comments: Disheveled adult male in no distress speaking quickly when awake, but falls asleep again quickly.  HENT:     Head: Normocephalic and atraumatic.  Eyes:     Conjunctiva/sclera: Conjunctivae normal.  Cardiovascular:     Rate and Rhythm: Normal rate and regular rhythm.  Pulses: Normal pulses.  Pulmonary:     Effort: Pulmonary effort is normal. No respiratory distress.     Breath sounds: No stridor.  Abdominal:     General: There is no distension.     Tenderness: There is no guarding.  Musculoskeletal:     Comments: No obvious deformity of the foot, but the patient is hesitant to let me palpate the midfoot.  Achilles and dorsiflexion grossly intact.  No proximal injuries that are obvious.  Skin:    General: Skin is warm and dry.  Neurological:     Comments: Moves all extremities spontaneously, face is symmetric, speech is brief, clear, but the patient falls asleep again quickly after initially following commands.  Psychiatric:      Comments: Intoxicated    ED Results / Procedures / Treatments   Labs (all labs ordered are listed, but only abnormal results are displayed) Labs Reviewed - No data to display  EKG None  Radiology DG Foot Complete Left  Result Date: 08/17/2021 CLINICAL DATA:  Fall EXAM: LEFT FOOT - COMPLETE 3+ VIEW COMPARISON:  None Available. FINDINGS: Nondisplaced calcaneal fracture, with mild loss of height anteriorly. Soft tissue swelling of the hindfoot. IMPRESSION: Nondisplaced calcaneal fracture. Electronically Signed   By: Charline Bills M.D.   On: 08/17/2021 20:19    Procedures Procedures    Medications Ordered in ED Medications - No data to display  ED Course/ Medical Decision Making/ A&P This patient with a Hx of alcohol abuse presents to the ED for concern of foot pain, this involves an extensive number of treatment options, and is a complaint that carries with it a high risk of complications and morbidity.    The differential diagnosis includes fracture, soft tissue injury, trauma   Social Determinants of Health:  Alcohol abuse  Additional history obtained:  Additional history and/or information obtained from EMS, notable for vital signs stable in transport, history of injury that occurred about 1 week ago   After the initial evaluation, orders, including: X-ray were initiated.   The patient was also maintained on pulse oximetry. The readings were typically 100% room air   On repeat evaluation of the patient stayed the same  Imaging Studies ordered:  I independently visualized and interpreted imaging which showed fracture of the calcaneus I agree with the radiologist interpretation  Consultations Obtained:  I requested consultation with the orthopedic surgery team, Dr. Blanchie Dessert,  and discussed lab and imaging findings as well as pertinent plan - they recommend: Splint immobilization, CT scan, nonweightbearing status, outpatient follow-up  Dispostion / Final  MDM:  After consideration of the diagnostic results and the patient's response to treatment, patient has had splint applied, well-tolerated, remains sleeping, clinically intoxicated, without evidence for hemodynamic instability or other traumatic findings.  Patient will receive CT scan, and when awake, can be discharged safely to follow-up with orthopedics as an outpatient.  Final Clinical Impression(s) / ED Diagnoses Final diagnoses:  Acute alcoholic intoxication with delirium (HCC)  Closed nondisplaced fracture of body of left calcaneus, initial encounter     Gerhard Munch, MD 08/17/21 2338

## 2021-08-17 NOTE — ED Notes (Signed)
Pt refusing temp .

## 2021-08-18 MED ORDER — HYDROCODONE-ACETAMINOPHEN 5-325 MG PO TABS: 1.0000 | ORAL_TABLET | Freq: Three times a day (TID) | ORAL | 0 refills | Status: AC | PRN

## 2021-08-18 NOTE — ED Notes (Signed)
Pt sleeping, respirations regular, even, unlabored.

## 2021-08-18 NOTE — ED Provider Notes (Signed)
I assumed care of this patient.  Please see previous provider note for further details of Hx, PE.  Briefly patient is a 51 y.o. male who presented foot pain noted to have calcaneus fracture pending a CT scan.  Patient also has alcohol intoxication.  He is metabolizing to freedom.   Patient clinically sober. Splinted and provided with crutches  The patient appears reasonably screened and/or stabilized for discharge and I doubt any other medical condition or other [facility] requiring further screening, evaluation, or treatment in the ED at this time prior to discharge. Safe for discharge with strict return precautions.  Disposition: Discharge  Condition: Good  I have discussed the results, Dx and Tx plan with the patient/family who expressed understanding and agree(s) with the plan. Discharge instructions discussed at length. The patient/family was given strict return precautions who verbalized understanding of the instructions. No further questions at time of discharge.    ED Discharge Orders          Ordered    HYDROcodone-acetaminophen (NORCO/VICODIN) 5-325 MG tablet  Every 8 hours PRN        08/18/21 0218            [facility] narcotic database reviewed and no active prescriptions noted.   Follow Up: Joen Laura, MD [address] [place] [address] [phone]  Schedule an appointment as soon as possible for a visit           Eudelia Bunch Amadeo Garnet, MD 08/18/21 [phone]

## 2021-08-18 NOTE — ED Notes (Signed)
The pt was moved from green to sleep off his alcohol until the a,  he is a frequent visitor in the ed  hes homeless

## 2021-08-31 ENCOUNTER — Emergency Department (HOSPITAL_COMMUNITY)
Admission: EM | Admit: 2021-08-31 | Discharge: 2021-09-01 | Discharge status: Against Medical Advice | Payer: Medicaid Other | Attending: Emergency Medicine | Admitting: Emergency Medicine

## 2021-08-31 ENCOUNTER — Emergency Department (HOSPITAL_COMMUNITY): Payer: Medicaid Other

## 2021-08-31 ENCOUNTER — Encounter (HOSPITAL_COMMUNITY): Payer: Self-pay

## 2021-08-31 ENCOUNTER — Other Ambulatory Visit: Payer: Self-pay

## 2021-08-31 DIAGNOSIS — Z5321 Procedure and treatment not carried out due to patient leaving prior to being seen by health care provider: Secondary | ICD-10-CM | POA: Diagnosis not present

## 2021-08-31 DIAGNOSIS — M79672 Pain in left foot: Secondary | ICD-10-CM | POA: Insufficient documentation

## 2021-08-31 NOTE — ED Triage Notes (Signed)
Pt c/o left foot pain, pt hurt foot 5/15 at work.  Pt seen for same recently. Pt A&Ox4, NAD noted.

## 2021-09-01 NOTE — ED Notes (Signed)
PATIENT WAS LYING ON FLOOR LEFT  WITH OUT BEING SEEN ONCE IT GOTTEN DAY LIGHT

## 2021-09-16 ENCOUNTER — Emergency Department (HOSPITAL_COMMUNITY): Payer: Medicaid Other

## 2021-09-16 ENCOUNTER — Encounter (HOSPITAL_COMMUNITY): Payer: Self-pay | Admitting: Emergency Medicine

## 2021-09-16 ENCOUNTER — Emergency Department (HOSPITAL_COMMUNITY)
Admission: EM | Admit: 2021-09-16 | Discharge: 2021-09-17 | Disposition: A | Discharge status: Home / Self Care | Payer: Medicaid Other | Attending: Emergency Medicine | Admitting: Emergency Medicine

## 2021-09-16 DIAGNOSIS — F10920 Alcohol use, unspecified with intoxication, uncomplicated: Secondary | ICD-10-CM

## 2021-09-16 DIAGNOSIS — R7309 Other abnormal glucose: Secondary | ICD-10-CM | POA: Insufficient documentation

## 2021-09-16 DIAGNOSIS — R4182 Altered mental status, unspecified: Secondary | ICD-10-CM | POA: Insufficient documentation

## 2021-09-16 DIAGNOSIS — F10129 Alcohol abuse with intoxication, unspecified: Secondary | ICD-10-CM | POA: Diagnosis present

## 2021-09-16 DIAGNOSIS — Y908 Blood alcohol level of 240 mg/100 ml or more: Secondary | ICD-10-CM | POA: Diagnosis not present

## 2021-09-16 LAB — CBC WITH DIFFERENTIAL/PLATELET
Abs Immature Granulocytes: 0.02 10*3/uL (ref 0.00–0.07)
Basophils Absolute: 0.1 10*3/uL (ref 0.0–0.1)
Basophils Relative: 1 %
Eosinophils Absolute: 0.1 10*3/uL (ref 0.0–0.5)
Eosinophils Relative: 1 %
HCT: 42 % (ref 39.0–52.0)
Hemoglobin: 14.4 g/dL (ref 13.0–17.0)
Immature Granulocytes: 0 %
Lymphocytes Relative: 38 %
Lymphs Abs: 2.7 10*3/uL (ref 0.7–4.0)
MCH: 31.4 pg (ref 26.0–34.0)
MCHC: 34.3 g/dL (ref 30.0–36.0)
MCV: 91.5 fL (ref 80.0–100.0)
Monocytes Absolute: 0.5 10*3/uL (ref 0.1–1.0)
Monocytes Relative: 7 %
Neutro Abs: 3.7 10*3/uL (ref 1.7–7.7)
Neutrophils Relative %: 53 %
Platelets: 192 10*3/uL (ref 150–400)
RBC: 4.59 MIL/uL (ref 4.22–5.81)
RDW: 12.6 % (ref 11.5–15.5)
WBC: 7 10*3/uL (ref 4.0–10.5)
nRBC: 0 % (ref 0.0–0.2)

## 2021-09-16 LAB — ETHANOL: Alcohol, Ethyl (B): 352 mg/dL (ref <10)

## 2021-09-16 LAB — CBG MONITORING, ED
Glucose-Capillary: 100 mg/dL — ABNORMAL HIGH (ref 70–99)
Glucose-Capillary: 91 mg/dL (ref 70–99)

## 2021-09-16 MED ORDER — SODIUM CHLORIDE 0.9 % IV BOLUS
1000.0000 mL | Freq: Once | INTRAVENOUS | Status: AC
  Administered 2021-09-16: 1000 mL via INTRAVENOUS

## 2021-09-16 NOTE — ED Provider Notes (Signed)
[facility] EMERGENCY DEPARTMENT Provider Note   CSN: [id number] Arrival date & time: 09/16/21  2221     History  Chief Complaint  Patient presents with   Alcohol Intoxication    Dustin Hansen is a 51 y.o. male.  Patient brought to the emergency department by [facility] after a bystander called.  Patient was found lying on the side of the road smelling of alcohol.  Patient received 500 mL fluid bolus with EMS.  Patient is easily arousable to voice.  Patient appears intoxicated and is slurring his words.  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  I had a montagnard vietnamese translator speak to the patient.  The interpreter was unable to understand patient.   Alcohol Intoxication       Home Medications Prior to Admission medications   Not on File      Allergies    Patient has no allergy information on record.    Review of Systems   Review of Systems  Unable to perform ROS: Other (Intoxicated)    Physical Exam Updated Vital Signs BP 100/75 (BP Location: Right Arm)   Pulse 72   Temp 97.6 F (36.4 C) (Axillary)   Resp 14   Ht 5\' 8"  (1.727 m)   Wt 68 kg   SpO2 95%   BMI 22.81 kg/m  Physical Exam Vitals and nursing note reviewed.  Constitutional:      General: He is not in acute distress.    Appearance: He is not ill-appearing, toxic-appearing or diaphoretic.     Comments: Patient disheveled appearance.  Smells of alcohol.  HENT:     Head: Normocephalic and atraumatic. No raccoon eyes, Battle's sign, abrasion, contusion, right periorbital erythema, left periorbital erythema or laceration.  Eyes:     General: No scleral icterus.       Right eye: No discharge.        Left eye: No discharge.     Extraocular Movements: Extraocular movements intact.     Conjunctiva/sclera: Conjunctivae normal.     Pupils: Pupils are equal, round, and reactive to light. Pupils are equal.  Cardiovascular:     Rate and Rhythm:  Normal rate.  Pulmonary:     Effort: Pulmonary effort is normal.  Chest:     Chest wall: No mass, lacerations, deformity, swelling, tenderness, crepitus or edema.  Abdominal:     General: Abdomen is flat. There is no distension. There are no signs of injury.     Palpations: Abdomen is soft. There is no mass or pulsatile mass.     Tenderness: There is no abdominal tenderness. There is no guarding or rebound.  Musculoskeletal:     Comments: No tenderness, point tenderness, deformity, or wounds to bilateral upper or lower extremities.  No midline tenderness or deformity to cervical, thoracic, or lumbar spine.  Skin:    General: Skin is warm and dry.  Neurological:     General: No focal deficit present.     Mental Status: He is easily aroused.     GCS: GCS eye subscore is 3. GCS verbal subscore is 4. GCS motor subscore is 6.     Comments: Patient appears intoxicated.  Able to follow simple commands however is very uncooperative.  Psychiatric:        Behavior: Behavior is uncooperative.     ED Results / Procedures / Treatments   Labs (all labs ordered are listed, but only abnormal results are displayed) Labs  Reviewed  COMPREHENSIVE METABOLIC PANEL - Abnormal; Notable for the following components:      Result Value   Potassium 3.3 (*)    Glucose, Bld 100 (*)    Calcium 8.3 (*)    Total Protein 6.2 (*)    GFR, Estimated 59 (*)    All other components within normal limits  ETHANOL - Abnormal; Notable for the following components:   Alcohol, Ethyl (B) 352 (*)    All other components within normal limits  CBG MONITORING, ED - Abnormal; Notable for the following components:   Glucose-Capillary 100 (*)    All other components within normal limits  CBC WITH DIFFERENTIAL/PLATELET    EKG None  Radiology No results found.  Procedures Procedures    Medications Ordered in ED Medications  sodium chloride 0.9 % bolus 1,000 mL (1,000 mLs Intravenous New Bag/Given 09/16/21 2247)     ED Course/ Medical Decision Making/ A&P                           Medical Decision Making Amount and/or Complexity of Data Reviewed Labs: ordered. Radiology: ordered.   Male patient easily arousable to voice.  He is in no acute distress and nontoxic-appearing.  Patient appears disheveled and is smelling of alcohol.  Patient unable to spell or state his name/date of birth.  Patient brought to the emergency department due to concerns for intoxication.  Information obtained from [facility].  Unable to review patient's past medical records as we do not know his name or date of birth.  I suspect that patient is intoxicated however will look for other causes of possible altered mental status.  Will check noncontrasted CT, lab work, and CBG.  Patient has no obvious signs of injury.  I personally viewed interpret patient's lab results.  Pertinent findings include: -CBG 100 -Potassium 3.3 -EtOH 352  At time of handoff noncontrast head CT is pending.  Suspect patient's altered mental status is secondary to EtOH use.  Plan to let patient metabolize freedom and reassess.  Patient care transferred to PA Sanders at the end of my shift. Patient presentation, ED course, and plan of care discussed with review of all pertinent labs and imaging. Please see his/her note for further details regarding further ED course and disposition.         Final Clinical Impression(s) / ED Diagnoses Final diagnoses:  None    Rx / DC Orders ED Discharge Orders     None         Berneice Heinrich 09/16/21 2356    Linwood Dibbles, MD 09/18/21 [phone]

## 2021-09-16 NOTE — ED Triage Notes (Signed)
Pt bib [facility] from the side of the road, called by bystander. Pt appears to have ETOH on board, does not c/o any pain. EMS VSS. 20G IV L AC, NS bag infusing

## 2021-09-16 NOTE — ED Notes (Signed)
ED provider informed of critical blood ethanol level 352

## 2021-09-17 LAB — COMPREHENSIVE METABOLIC PANEL
ALT: 37 U/L (ref 0–44)
AST: 37 U/L (ref 15–41)
Albumin: 3.6 g/dL (ref 3.5–5.0)
Alkaline Phosphatase: 64 U/L (ref 38–126)
Anion gap: 12 (ref 5–15)
BUN: 10 mg/dL (ref 6–20)
CO2: 22 mmol/L (ref 22–32)
Calcium: 8.3 mg/dL — ABNORMAL LOW (ref 8.9–10.3)
Chloride: 106 mmol/L (ref 98–111)
Creatinine, Ser: 0.81 mg/dL (ref 0.61–1.24)
GFR, Estimated: 59 mL/min — ABNORMAL LOW (ref >60)
Glucose, Bld: 100 mg/dL — ABNORMAL HIGH (ref 70–99)
Potassium: 3.3 mmol/L — ABNORMAL LOW (ref 3.5–5.1)
Sodium: 140 mmol/L (ref 135–145)
Total Bilirubin: 1.1 mg/dL (ref 0.3–1.2)
Total Protein: 6.2 g/dL — ABNORMAL LOW (ref 6.5–8.1)

## 2021-09-17 LAB — RAPID URINE DRUG SCREEN, HOSP PERFORMED
Amphetamines: POSITIVE — AB
Barbiturates: NOT DETECTED
Benzodiazepines: NOT DETECTED
Cocaine: NOT DETECTED
Opiates: NOT DETECTED
Tetrahydrocannabinol: NOT DETECTED

## 2021-09-17 NOTE — ED Notes (Signed)
E-signature pad unavailable at time of pt discharge. This RN discussed discharge materials with pt and answered all pt questions. Pt stated understanding of discharge material. ? ?

## 2021-09-17 NOTE — Discharge Instructions (Signed)
Drink responsibly. 

## 2021-09-17 NOTE — ED Notes (Signed)
Pt now awake and able to appropriately converse with this RN. Ambulated self to and from bathroom x2 for urination.

## 2021-09-17 NOTE — ED Notes (Signed)
Pt called ride home

## 2021-09-17 NOTE — ED Provider Notes (Signed)
  CT head negative.  5:41 AM Patient has been observed overnight, no acute events.  He is now awake, alert, ambulatory to and from bathroom without issue.  Stable for discharge.  Advised to drink responsibly.  Can return here for new concerns.   Garlon Hatchet, PA-C 09/17/21 0545    Nira Conn, MD 09/18/21 [phone]

## 2021-09-19 ENCOUNTER — Encounter (HOSPITAL_COMMUNITY): Payer: Self-pay

## 2021-10-27 ENCOUNTER — Encounter (HOSPITAL_COMMUNITY): Payer: Self-pay | Admitting: Emergency Medicine

## 2021-10-27 ENCOUNTER — Emergency Department (HOSPITAL_COMMUNITY)
Admission: EM | Admit: 2021-10-27 | Discharge: 2021-10-27 | Discharge status: Against Medical Advice | Payer: Medicaid Other | Attending: Emergency Medicine | Admitting: Emergency Medicine

## 2021-10-27 ENCOUNTER — Other Ambulatory Visit: Payer: Self-pay

## 2021-10-27 DIAGNOSIS — M79606 Pain in leg, unspecified: Secondary | ICD-10-CM | POA: Insufficient documentation

## 2021-10-27 DIAGNOSIS — Z5321 Procedure and treatment not carried out due to patient leaving prior to being seen by health care provider: Secondary | ICD-10-CM | POA: Diagnosis not present

## 2021-10-27 NOTE — ED Triage Notes (Signed)
BIB EMS   ETOH  ambulatory  Complaints of leg pain. Obviously intoxicated in triage

## 2021-11-01 ENCOUNTER — Emergency Department (HOSPITAL_COMMUNITY): Payer: Medicaid Other

## 2021-11-01 ENCOUNTER — Other Ambulatory Visit: Payer: Self-pay

## 2021-11-01 ENCOUNTER — Emergency Department (HOSPITAL_COMMUNITY)
Admission: EM | Admit: 2021-11-01 | Discharge: 2021-11-01 | Disposition: A | Discharge status: Home / Self Care | Payer: Medicaid Other | Attending: Emergency Medicine | Admitting: Emergency Medicine

## 2021-11-01 DIAGNOSIS — F10120 Alcohol abuse with intoxication, uncomplicated: Secondary | ICD-10-CM | POA: Diagnosis present

## 2021-11-01 DIAGNOSIS — Y907 Blood alcohol level of 200-239 mg/100 ml: Secondary | ICD-10-CM | POA: Insufficient documentation

## 2021-11-01 DIAGNOSIS — Z79899 Other long term (current) drug therapy: Secondary | ICD-10-CM | POA: Diagnosis not present

## 2021-11-01 DIAGNOSIS — Z5900 Homelessness unspecified: Secondary | ICD-10-CM | POA: Insufficient documentation

## 2021-11-01 DIAGNOSIS — I10 Essential (primary) hypertension: Secondary | ICD-10-CM | POA: Insufficient documentation

## 2021-11-01 DIAGNOSIS — M79671 Pain in right foot: Secondary | ICD-10-CM | POA: Insufficient documentation

## 2021-11-01 DIAGNOSIS — M7918 Myalgia, other site: Secondary | ICD-10-CM | POA: Insufficient documentation

## 2021-11-01 DIAGNOSIS — F10920 Alcohol use, unspecified with intoxication, uncomplicated: Secondary | ICD-10-CM

## 2021-11-01 LAB — CBC WITH DIFFERENTIAL/PLATELET
Abs Immature Granulocytes: 0.02 10*3/uL (ref 0.00–0.07)
Basophils Absolute: 0.1 10*3/uL (ref 0.0–0.1)
Basophils Relative: 1 %
Eosinophils Absolute: 0.1 10*3/uL (ref 0.0–0.5)
Eosinophils Relative: 1 %
HCT: 48.6 % (ref 39.0–52.0)
Hemoglobin: 17 g/dL (ref 13.0–17.0)
Immature Granulocytes: 0 %
Lymphocytes Relative: 45 %
Lymphs Abs: 2.5 10*3/uL (ref 0.7–4.0)
MCH: 32 pg (ref 26.0–34.0)
MCHC: 35 g/dL (ref 30.0–36.0)
MCV: 91.5 fL (ref 80.0–100.0)
Monocytes Absolute: 0.6 10*3/uL (ref 0.1–1.0)
Monocytes Relative: 10 %
Neutro Abs: 2.4 10*3/uL (ref 1.7–7.7)
Neutrophils Relative %: 43 %
Platelets: 220 10*3/uL (ref 150–400)
RBC: 5.31 MIL/uL (ref 4.22–5.81)
RDW: 12.5 % (ref 11.5–15.5)
WBC: 5.6 10*3/uL (ref 4.0–10.5)
nRBC: 0 % (ref 0.0–0.2)

## 2021-11-01 LAB — COMPREHENSIVE METABOLIC PANEL
ALT: 38 U/L (ref 0–44)
AST: 61 U/L — ABNORMAL HIGH (ref 15–41)
Albumin: 3.4 g/dL — ABNORMAL LOW (ref 3.5–5.0)
Alkaline Phosphatase: 80 U/L (ref 38–126)
Anion gap: 11 (ref 5–15)
BUN: 9 mg/dL (ref 6–20)
CO2: 23 mmol/L (ref 22–32)
Calcium: 8.1 mg/dL — ABNORMAL LOW (ref 8.9–10.3)
Chloride: 105 mmol/L (ref 98–111)
Creatinine, Ser: 0.91 mg/dL (ref 0.61–1.24)
GFR, Estimated: >60 mL/min (ref >60)
Glucose, Bld: 135 mg/dL — ABNORMAL HIGH (ref 70–99)
Potassium: 3.7 mmol/L (ref 3.5–5.1)
Sodium: 139 mmol/L (ref 135–145)
Total Bilirubin: 0.7 mg/dL (ref 0.3–1.2)
Total Protein: 5.9 g/dL — ABNORMAL LOW (ref 6.5–8.1)

## 2021-11-01 LAB — ETHANOL: Alcohol, Ethyl (B): 239 mg/dL — ABNORMAL HIGH (ref <10)

## 2021-11-01 MED ORDER — SODIUM CHLORIDE 0.9 % IV BOLUS
1000.0000 mL | Freq: Once | INTRAVENOUS | Status: AC
  Administered 2021-11-01: 1000 mL via INTRAVENOUS

## 2021-11-01 NOTE — ED Provider Notes (Signed)
[facility] EMERGENCY DEPARTMENT Provider Note   CSN: [id number] Arrival date & time: 11/01/21  1741     History PMH: HTN, thoracic aortic aneurysm, ETOH use disorder, Homeless Chief Complaint  Patient presents with   Alcohol Intoxication    Dustin Hansen is a 51 y.o. male. Presents the ED after being found intoxicated.  Apparently he [place] sidewalk [place] and a bystander called.  Patient is complaining of generalized aching in his back, legs, and arms.  He specifically is complaining of left foot pain which she says has been present for about 4 months now.  He has been able to ambulate on this.  He reports only drinking 1 beer in the past 24 hours.  Denies any chest pain, shortness of breath, abdominal pain, nausea, vomiting, syncope, dizziness.   Alcohol Intoxication       Home Medications Prior to Admission medications   Medication Sig Start Date End Date Taking? Authorizing Provider  acetaminophen (TYLENOL 8 HOUR) 650 MG CR tablet Take 1 tablet (650 mg total) by mouth every 8 (eight) hours as needed for pain. 05/29/21   Mannie Stabile, PA-C  acetaminophen (TYLENOL) 325 MG tablet Take 2 tablets (650 mg total) by mouth every 6 (six) hours as needed. 07/11/18   Hedges, Tinnie Gens, PA-C  amLODipine (NORVASC) 5 MG tablet Take 1 tablet (5 mg total) by mouth daily. 06/15/17   Casey Burkitt, MD  benzonatate (TESSALON) 100 MG capsule Take 1 capsule (100 mg total) by mouth every 8 (eight) hours. 05/29/21   Mannie Stabile, PA-C  cyclobenzaprine (FLEXERIL) 10 MG tablet Take 1 tablet (10 mg total) by mouth 2 (two) times daily as needed for muscle spasms. 11/11/20   Fayrene Helper, PA-C  divalproex (DEPAKOTE) 500 MG DR tablet Take 1,000 mg by mouth at bedtime.    [provider]  erythromycin ophthalmic ointment Place a 1/2 inch ribbon of ointment into the lower eyelid 4 times daily for 7 days. 11/07/18   Couture, Cortni S, PA-C   fluticasone (FLONASE) 50 MCG/ACT nasal spray Place into both nostrils daily.    [provider]  haloperidol decanoate (HALDOL DECANOATE) 50 MG/ML injection Inject 50 mg into the muscle every 28 (twenty-eight) days.    [provider]  ibuprofen (ADVIL) 600 MG tablet Take 1 tablet (600 mg total) by mouth every 6 (six) hours as needed. 11/11/20   Fayrene Helper, PA-C  ondansetron (ZOFRAN ODT) 4 MG disintegrating tablet Take 1 tablet (4 mg total) by mouth every 8 (eight) hours as needed for nausea or vomiting. 06/27/18   Joy, Shawn C, PA-C      Allergies    Patient has no known allergies.    Review of Systems   Review of Systems  Musculoskeletal:  Positive for myalgias.  All other systems reviewed and are negative.   Physical Exam Updated Vital Signs BP 130/87   Pulse 66   Temp 98.2 F (36.8 C) (Oral)   Resp 14   Ht 5\' 8"  (1.727 m)   Wt 74.8 kg   SpO2 97%   BMI 25.09 kg/m  Physical Exam Vitals and nursing note reviewed.  Constitutional:      General: He is not in acute distress.    Appearance: Normal appearance. He is well-developed. He is not ill-appearing, toxic-appearing or diaphoretic.  HENT:     Head: Normocephalic and atraumatic.     Nose: No nasal deformity.  Mouth/Throat:     Lips: Pink. No lesions.  Eyes:     General: Gaze aligned appropriately. No scleral icterus.       Right eye: No discharge.        Left eye: No discharge.     Conjunctiva/sclera: Conjunctivae normal.     Right eye: Right conjunctiva is not injected. No exudate or hemorrhage.    Left eye: Left conjunctiva is not injected. No exudate or hemorrhage. Cardiovascular:     Rate and Rhythm: Normal rate and regular rhythm.     Pulses: Normal pulses.     Heart sounds: Normal heart sounds. No murmur heard.    No friction rub. No gallop.  Pulmonary:     Effort: Pulmonary effort is normal. No respiratory distress.     Breath sounds: Normal breath sounds. No stridor. No wheezing,  rhonchi or rales.  Chest:     Chest wall: No tenderness.  Abdominal:     General: Abdomen is flat. There is no distension.     Palpations: Abdomen is soft.     Tenderness: There is no abdominal tenderness. There is no right CVA tenderness, left CVA tenderness, guarding or rebound.  Musculoskeletal:     Right lower leg: No edema.     Left lower leg: No edema.     Comments: Right foot with tenderness to the medial heel. No deformity or swelling noted. 2+ pedal pulse.   Skin:    General: Skin is warm and dry.  Neurological:     Mental Status: He is alert and oriented to person, place, and time.  Psychiatric:        Mood and Affect: Mood normal.        Speech: Speech normal.        Behavior: Behavior normal. Behavior is cooperative.     ED Results / Procedures / Treatments   Labs (all labs ordered are listed, but only abnormal results are displayed) Labs Reviewed  ETHANOL - Abnormal; Notable for the following components:      Result Value   Alcohol, Ethyl (B) 239 (*)    All other components within normal limits  COMPREHENSIVE METABOLIC PANEL - Abnormal; Notable for the following components:   Glucose, Bld 135 (*)    Calcium 8.1 (*)    Total Protein 5.9 (*)    Albumin 3.4 (*)    AST 61 (*)    All other components within normal limits  CBC WITH DIFFERENTIAL/PLATELET    EKG None  Radiology CT HEAD WO CONTRAST ( )  Result Date: 11/01/2021 CLINICAL DATA:  Recent fall, alcohol intoxication, initial encounter EXAM: CT HEAD WITHOUT CONTRAST TECHNIQUE: Contiguous axial images were obtained from the base of the skull through the vertex without intravenous contrast. RADIATION DOSE REDUCTION: This exam was performed according to the departmental dose-optimization program which includes automated exposure control, adjustment of the mA and/or kV according to patient size and/or use of iterative reconstruction technique. COMPARISON:  06/19/2017 FINDINGS: Brain: No evidence of acute  infarction, hemorrhage, hydrocephalus, extra-axial collection or mass lesion/mass effect. Chronic atrophic and ischemic changes are noted. Bilateral basal ganglia calcifications are seen. 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 Vascular: No hyperdense vessel or unexpected calcification. Skull: Normal. Negative for fracture or focal lesion. Sinuses/Orbits: Orbits and their contents are within normal limits. Mucosal thickening is noted within the right maxillary antrum. No air-fluid level is noted. Other: None. IMPRESSION: Chronic atrophic and ischemic changes without acute  abnormality. Chronic mucosal thickening within the right maxillary antrum. Electronically Signed   By: Alcide Clever M.D.   On: 11/01/2021 19:16    Procedures Procedures   Medications Ordered in ED Medications  sodium chloride 0.9 % bolus 1,000 mL (1,000 mLs Intravenous New Bag/Given 11/01/21 1828)    ED Course/ Medical Decision Making/ A&P                           Medical Decision Making Amount and/or Complexity of Data Reviewed Labs: ordered. Radiology: ordered.   Patient is presenting after being found intoxicated [place] a church today.  He is hemodynamically stable and afebrile.  He appears intoxicated on exam.  He has no other concerning findings.  He is complaining of generalized myalgias.  We have obtained labs.  His CBC shows no leukocytosis, anemia, or other abnormalities.  CMP reveals no acute abnormalities.  EtOH level is 239.  CT head shows no acute findings. No findings on history or exam prompt further evaluation. He is still intoxicated so will need ride home. Stable for discharge.   Final Clinical Impression(s) / ED Diagnoses Final diagnoses:  Alcoholic intoxication without complication [facility])    Rx / DC Orders ED Discharge Orders     None         Claudie Leach, PA-C 11/01/21 2101    Charlynne Pander, MD 11/01/21 [phone]

## 2021-11-01 NOTE — ED Notes (Signed)
Per lab CMP hemolyzed, will redraw shortly.

## 2021-11-01 NOTE — ED Triage Notes (Signed)
Pt BIB EMS for alcohol intoxication. A bystander called because the patient was sitting [place] the sidewalk [place]. Pt states that he also fell. Complaining of generalized pain.  EMS Vitals 172/100 86 HR 92% RA CBG 124

## 2021-11-01 NOTE — ED Notes (Signed)
Patient verbalizes understanding of d/c instructions. Opportunities for questions and answers were provided. Pt d/c from ED and ambulated to lobby. Bus pass given.  

## 2021-11-20 ENCOUNTER — Emergency Department (HOSPITAL_COMMUNITY): Payer: Medicaid Other

## 2021-11-20 ENCOUNTER — Other Ambulatory Visit: Payer: Self-pay

## 2021-11-20 ENCOUNTER — Emergency Department (HOSPITAL_COMMUNITY)
Admission: EM | Admit: 2021-11-20 | Discharge: 2021-11-20 | Disposition: A | Discharge status: Home / Self Care | Payer: Medicaid Other | Attending: Emergency Medicine | Admitting: Emergency Medicine

## 2021-11-20 ENCOUNTER — Encounter (HOSPITAL_COMMUNITY): Payer: Self-pay | Admitting: Emergency Medicine

## 2021-11-20 DIAGNOSIS — M79672 Pain in left foot: Secondary | ICD-10-CM

## 2021-11-20 MED ORDER — IBUPROFEN 800 MG PO TABS
800.0000 mg | ORAL_TABLET | Freq: Once | ORAL | Status: AC
  Administered 2021-11-20: 800 mg via ORAL
  Filled 2021-11-20: qty 1

## 2021-11-20 MED ORDER — IBUPROFEN 800 MG PO TABS: 800.0000 mg | ORAL_TABLET | Freq: Three times a day (TID) | ORAL | 0 refills | Status: AC | PRN

## 2021-11-20 NOTE — ED Triage Notes (Signed)
Patient ambulatory c/o left foot pain since may. 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 States he has been seen many times and told it is fine however he needs pain medicine.

## 2021-11-20 NOTE — ED Provider Notes (Signed)
[facility] EMERGENCY DEPARTMENT Provider Note   CSN: [id number] Arrival date & time: 11/20/21  6294     History  Chief Complaint  Patient presents with   Foot Pain    Dustin Hansen is a 51 y.o. male.  Pt injured his foot in May.  Pt was diagnosed with a calcaneous fracture.  Pt reports he needs codone for pain.  Pt did not follow up with anyone for fracture   The history is provided by the patient. No language interpreter was used.  Foot Pain This is a new problem. The problem occurs constantly. The problem has been gradually worsening. Nothing aggravates the symptoms. Nothing relieves the symptoms.       Home Medications Prior to Admission medications   Medication Sig Start Date End Date Taking? Authorizing Provider  acetaminophen (TYLENOL 8 HOUR) 650 MG CR tablet Take 1 tablet (650 mg total) by mouth every 8 (eight) hours as needed for pain. 05/29/21   Mannie Stabile, PA-C  acetaminophen (TYLENOL) 325 MG tablet Take 2 tablets (650 mg total) by mouth every 6 (six) hours as needed. 07/11/18   Hedges, Tinnie Gens, PA-C  amLODipine (NORVASC) 5 MG tablet Take 1 tablet (5 mg total) by mouth daily. 06/15/17   Casey Burkitt, MD  benzonatate (TESSALON) 100 MG capsule Take 1 capsule (100 mg total) by mouth every 8 (eight) hours. 05/29/21   Mannie Stabile, PA-C  cyclobenzaprine (FLEXERIL) 10 MG tablet Take 1 tablet (10 mg total) by mouth 2 (two) times daily as needed for muscle spasms. 11/11/20   Fayrene Helper, PA-C  divalproex (DEPAKOTE) 500 MG DR tablet Take 1,000 mg by mouth at bedtime.    [provider]  erythromycin ophthalmic ointment Place a 1/2 inch ribbon of ointment into the lower eyelid 4 times daily for 7 days. 11/07/18   Couture, Cortni S, PA-C  fluticasone (FLONASE) 50 MCG/ACT nasal spray Place into both nostrils daily.    [provider]  haloperidol decanoate (HALDOL DECANOATE) 50 MG/ML injection Inject 50 mg into the muscle every  28 (twenty-eight) days.    [provider]  ibuprofen (ADVIL) 600 MG tablet Take 1 tablet (600 mg total) by mouth every 6 (six) hours as needed. 11/11/20   Fayrene Helper, PA-C  ondansetron (ZOFRAN ODT) 4 MG disintegrating tablet Take 1 tablet (4 mg total) by mouth every 8 (eight) hours as needed for nausea or vomiting. 06/27/18   Joy, Shawn C, PA-C      Allergies    Patient has no known allergies.    Review of Systems   Review of Systems  Musculoskeletal:  Positive for gait problem.  All other systems reviewed and are negative.   Physical Exam Updated Vital Signs BP (!) 171/116 (BP Location: Left Arm)   Pulse 78   Temp 98.5 F (36.9 C) (Oral)   Resp 16   Ht 5\' 8"  (1.727 m)   Wt 74.8 kg   SpO2 99%   BMI 25.09 kg/m  Physical Exam Vitals reviewed.  Constitutional:      Appearance: Normal appearance.  Cardiovascular:     Rate and Rhythm: Normal rate.  Pulmonary:     Effort: Pulmonary effort is normal.  Musculoskeletal:        General: Swelling and tenderness present.     Comments: Tender heel of left foot  nv and ns intact   Skin:    General: Skin is warm.  Neurological:     General: No  focal deficit present.     Mental Status: He is alert.  Psychiatric:        Mood and Affect: Mood normal.     ED Results / Procedures / Treatments   Labs (all labs ordered are listed, but only abnormal results are displayed) Labs Reviewed - No data to display  EKG None  Radiology No results found.  Procedures Procedures    Medications Ordered in ED Medications - No data to display  ED Course/ Medical Decision Making/ A&P                           Medical Decision Making Pt has chronic left foot pain after fracture in May   Amount and/or Complexity of Data Reviewed Radiology: ordered and independent interpretation performed. Decision-making details documented in ED Course.    Details: Chronic fracture deformity  Discussion of management or test interpretation  with external provider(s): Pt advised he needs to follow up with Orthopaedist for evaltuion of persistent pain and difficulty walking           Final Clinical Impression(s) / ED Diagnoses Final diagnoses:  Foot pain, left    Rx / DC Orders ED Discharge Orders          Ordered    ibuprofen (ADVIL) 800 MG tablet  Every 8 hours PRN        11/20/21 1033          An After Visit Summary was printed and given to the patient.     Elson Areas, PA-C 11/20/21 1034    Linwood Dibbles, MD 11/21/21 [phone]

## 2021-11-20 NOTE — Discharge Instructions (Addendum)
Schedule to see the Orthopaedist for evatluion

## 2021-11-28 ENCOUNTER — Other Ambulatory Visit: Payer: Self-pay

## 2021-11-28 ENCOUNTER — Emergency Department (HOSPITAL_COMMUNITY)
Admission: EM | Admit: 2021-11-28 | Discharge: 2021-11-28 | Discharge status: Against Medical Advice | Payer: Medicaid Other | Attending: Emergency Medicine | Admitting: Emergency Medicine

## 2021-11-28 ENCOUNTER — Encounter (HOSPITAL_COMMUNITY): Payer: Self-pay

## 2021-11-28 DIAGNOSIS — M79672 Pain in left foot: Secondary | ICD-10-CM | POA: Diagnosis present

## 2021-11-28 DIAGNOSIS — Z5321 Procedure and treatment not carried out due to patient leaving prior to being seen by health care provider: Secondary | ICD-10-CM | POA: Diagnosis not present

## 2021-11-28 NOTE — ED Triage Notes (Signed)
Patient is homeless and complains of bilateral foot pain

## 2021-11-28 NOTE — ED Provider Triage Note (Signed)
Emergency Medicine Provider Triage Evaluation Note  Dustin Hansen , a 51 y.o. male  was evaluated in triage.  Pt complains of left foot pain. First he says that it started yesterday but now saying that it started in May. Unable to tell me where exactly it hurts as he points to his knee and then his ankle. He is able to ambulate. No new concerns Interpreter needed  Review of Systems  Positive:  Negative:   Physical Exam  BP 114/82   Pulse 73   Temp 98.1 F (36.7 C) (Oral)   Resp 18   Ht 5\' 8"  (1.727 m)   Wt 74.8 kg   SpO2 93%   BMI 25.09 kg/m  Gen:   Awake, no distress   Resp:  Normal effort  MSK:   Moves extremities without difficulty  Other:  Deformity are noted, pedal pulses 2+ bilaterally, no specific area of tenderness to palpation.  Medical Decision Making  Medically screening exam initiated at 10:36 AM.  Appropriate orders placed.  Rohen Weisenburger was informed that the remainder of the evaluation will be completed by another provider, this initial triage assessment does not replace that evaluation, and the importance of remaining in the ED until their evaluation is complete.     Leanora Ivanoff, PA-C 11/28/21 1037

## 2021-11-28 NOTE — ED Notes (Signed)
Pt Left the hospital

## 2022-04-09 ENCOUNTER — Emergency Department (HOSPITAL_COMMUNITY): Payer: Medicaid Other

## 2022-04-09 ENCOUNTER — Emergency Department (HOSPITAL_COMMUNITY)
Admission: EM | Admit: 2022-04-09 | Discharge: 2022-04-09 | Discharge status: Against Medical Advice | Payer: Medicaid Other | Attending: Emergency Medicine | Admitting: Emergency Medicine

## 2022-04-09 ENCOUNTER — Other Ambulatory Visit: Payer: Self-pay

## 2022-04-09 DIAGNOSIS — Y92522 Railway station as the place of occurrence of the external cause: Secondary | ICD-10-CM | POA: Diagnosis not present

## 2022-04-09 DIAGNOSIS — W1789XA Other fall from one level to another, initial encounter: Secondary | ICD-10-CM | POA: Insufficient documentation

## 2022-04-09 DIAGNOSIS — Z5321 Procedure and treatment not carried out due to patient leaving prior to being seen by health care provider: Secondary | ICD-10-CM | POA: Diagnosis not present

## 2022-04-09 DIAGNOSIS — S0990XA Unspecified injury of head, initial encounter: Secondary | ICD-10-CM | POA: Insufficient documentation

## 2022-04-09 DIAGNOSIS — M25572 Pain in left ankle and joints of left foot: Secondary | ICD-10-CM | POA: Insufficient documentation

## 2022-04-09 NOTE — ED Triage Notes (Signed)
Pt BIB [facility]. States falling out a station trailer. C/o head pain and left ankle pain. Swelling noted to left ankle. No deformity. Pt ambulatory on scene. A&Ox4. No c-collar on arrival.

## 2022-04-09 NOTE — ED Notes (Signed)
Called pt x3 for room, no response.
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
## 2022-04-14 ENCOUNTER — Emergency Department (HOSPITAL_COMMUNITY)
Admission: EM | Admit: 2022-04-14 | Discharge: 2022-04-14 | Discharge status: Against Medical Advice | Payer: Medicaid Other | Attending: Emergency Medicine | Admitting: Emergency Medicine

## 2022-04-14 ENCOUNTER — Emergency Department (HOSPITAL_COMMUNITY): Payer: Medicaid Other

## 2022-04-14 DIAGNOSIS — M79672 Pain in left foot: Secondary | ICD-10-CM | POA: Insufficient documentation

## 2022-04-14 DIAGNOSIS — Z5321 Procedure and treatment not carried out due to patient leaving prior to being seen by health care provider: Secondary | ICD-10-CM | POA: Insufficient documentation

## 2022-04-14 MED ORDER — IBUPROFEN 400 MG PO TABS: 600.0000 mg | ORAL_TABLET | Freq: Once | ORAL | Status: DC

## 2022-04-14 NOTE — ED Notes (Signed)
Pt hasn't responded to final call for room placement.
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
## 2022-04-14 NOTE — ED Provider Triage Note (Signed)
Emergency Medicine Provider Triage Evaluation Note  Dustin Hansen , a 52 y.o. male  was evaluated in triage.  Pt complains of left foot/ankle pain. Patient report injury 1-2 months ago when rolling foot when stepping off trailer. Reports persistent pain. Denies repeat trauma. Has taken no medicine for it.  Review of Systems  Positive:  Negative: See above  Physical Exam  BP (!) 150/93   Pulse 71   Temp 98.2 F (36.8 C) (Oral)   Resp 18   SpO2 95%  Gen:   Awake, no distress   Resp:  Normal effort  MSK:   Moves extremities without difficulty  Other:  TTP left ankle. 2+ pedal pulses. No skin changes  Medical Decision Making  Medically screening exam initiated at 3:13 PM.  Appropriate orders placed.  Saad Kraynak was informed that the remainder of the evaluation will be completed by another provider, this initial triage assessment does not replace that evaluation, and the importance of remaining in the ED until their evaluation is complete.     Wilnette Kales, [place] 04/14/22 1526

## 2022-04-14 NOTE — ED Notes (Signed)
Pt called 2x with no response for room placemeant

## 2023-01-06 ENCOUNTER — Emergency Department (HOSPITAL_COMMUNITY): Payer: MEDICAID

## 2023-01-06 ENCOUNTER — Emergency Department (HOSPITAL_COMMUNITY)
Admission: EM | Admit: 2023-01-06 | Discharge: 2023-01-06 | Disposition: A | Discharge status: Home / Self Care | Payer: MEDICAID | Attending: Emergency Medicine | Admitting: Emergency Medicine

## 2023-01-06 ENCOUNTER — Encounter (HOSPITAL_COMMUNITY): Payer: Self-pay

## 2023-01-06 DIAGNOSIS — H7292 Unspecified perforation of tympanic membrane, left ear: Secondary | ICD-10-CM | POA: Insufficient documentation

## 2023-01-06 DIAGNOSIS — S2232XA Fracture of one rib, left side, initial encounter for closed fracture: Secondary | ICD-10-CM | POA: Diagnosis not present

## 2023-01-06 DIAGNOSIS — M545 Low back pain, unspecified: Secondary | ICD-10-CM | POA: Diagnosis present

## 2023-01-06 DIAGNOSIS — Z5900 Homelessness unspecified: Secondary | ICD-10-CM | POA: Diagnosis not present

## 2023-01-06 LAB — BASIC METABOLIC PANEL
Anion gap: 14 (ref 5–15)
BUN: 12 mg/dL (ref 6–20)
CO2: 22 mmol/L (ref 22–32)
Calcium: 8.9 mg/dL (ref 8.9–10.3)
Chloride: 98 mmol/L (ref 98–111)
Creatinine, Ser: 0.85 mg/dL (ref 0.61–1.24)
GFR, Estimated: >60 mL/min (ref >60)
Glucose, Bld: 96 mg/dL (ref 70–99)
Potassium: 4.1 mmol/L (ref 3.5–5.1)
Sodium: 134 mmol/L — ABNORMAL LOW (ref 135–145)

## 2023-01-06 LAB — CBC WITH DIFFERENTIAL/PLATELET
Abs Immature Granulocytes: 0.04 10*3/uL (ref 0.00–0.07)
Basophils Absolute: 0.1 10*3/uL (ref 0.0–0.1)
Basophils Relative: 1 %
Eosinophils Absolute: 0.1 10*3/uL (ref 0.0–0.5)
Eosinophils Relative: 1 %
HCT: 50.2 % (ref 39.0–52.0)
Hemoglobin: 17.3 g/dL — ABNORMAL HIGH (ref 13.0–17.0)
Immature Granulocytes: 0 %
Lymphocytes Relative: 20 %
Lymphs Abs: 2.2 10*3/uL (ref 0.7–4.0)
MCH: 30.1 pg (ref 26.0–34.0)
MCHC: 34.5 g/dL (ref 30.0–36.0)
MCV: 87.3 fL (ref 80.0–100.0)
Monocytes Absolute: 0.8 10*3/uL (ref 0.1–1.0)
Monocytes Relative: 8 %
Neutro Abs: 7.5 10*3/uL (ref 1.7–7.7)
Neutrophils Relative %: 70 %
Platelets: 197 10*3/uL (ref 150–400)
RBC: 5.75 MIL/uL (ref 4.22–5.81)
RDW: 12.3 % (ref 11.5–15.5)
WBC: 10.7 10*3/uL — ABNORMAL HIGH (ref 4.0–10.5)
nRBC: 0 % (ref 0.0–0.2)

## 2023-01-06 LAB — ETHANOL: Alcohol, Ethyl (B): <10 mg/dL (ref <10)

## 2023-01-06 MED ORDER — HYDROMORPHONE HCL 1 MG/ML IJ SOLN
0.5000 mg | Freq: Once | INTRAMUSCULAR | Status: AC
  Administered 2023-01-06: 0.5 mg via INTRAVENOUS
  Filled 2023-01-06: qty 1

## 2023-01-06 MED ORDER — IOHEXOL 350 MG/ML SOLN
75.0000 mL | Freq: Once | INTRAVENOUS | Status: AC | PRN
  Administered 2023-01-06: 75 mL via INTRAVENOUS

## 2023-01-06 MED ORDER — IBUPROFEN 600 MG PO TABS: 600.0000 mg | ORAL_TABLET | Freq: Four times a day (QID) | ORAL | 0 refills | Status: AC | PRN

## 2023-01-06 MED ORDER — OXYCODONE HCL 5 MG PO TABS: 5.0000 mg | ORAL_TABLET | Freq: Four times a day (QID) | ORAL | 0 refills | Status: AC | PRN

## 2023-01-06 NOTE — ED Notes (Signed)
Awaiting patient from lobby 
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
## 2023-01-06 NOTE — ED Notes (Signed)
Patient transported to CT 
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
## 2023-07-29 ENCOUNTER — Emergency Department (HOSPITAL_COMMUNITY)
Admission: EM | Admit: 2023-07-29 | Discharge: 2023-07-29 | Discharge status: Absent without leave | Payer: MEDICAID | Source: Home / Self Care

## 2023-07-29 ENCOUNTER — Other Ambulatory Visit: Payer: Self-pay
